# Patient Record
Sex: Female | Born: 1955 | Race: White | Hispanic: No | Marital: Single | Smoking: Never smoker
Health system: Southern US, Community
[De-identification: ages and names within clinical notes are randomized; demographics above are authoritative.]

## PROBLEM LIST (undated history)

## (undated) DIAGNOSIS — N83209 Unspecified ovarian cyst, unspecified side: Secondary | ICD-10-CM

## (undated) DIAGNOSIS — K5792 Diverticulitis of intestine, part unspecified, without perforation or abscess without bleeding: Secondary | ICD-10-CM

## (undated) DIAGNOSIS — R921 Mammographic calcification found on diagnostic imaging of breast: Secondary | ICD-10-CM

## (undated) DIAGNOSIS — Z923 Personal history of irradiation: Secondary | ICD-10-CM

## (undated) DIAGNOSIS — C50919 Malignant neoplasm of unspecified site of unspecified female breast: Secondary | ICD-10-CM

## (undated) HISTORY — DX: Unspecified ovarian cyst, unspecified side: N83.209

## (undated) HISTORY — DX: Mammographic calcification found on diagnostic imaging of breast: R92.1

## (undated) HISTORY — DX: Malignant neoplasm of unspecified site of unspecified female breast: C50.919

---

## 1998-05-17 HISTORY — PX: BREAST LUMPECTOMY: SHX2

## 2005-05-09 ENCOUNTER — Emergency Department (HOSPITAL_COMMUNITY): Admission: EM | Admit: 2005-05-09 | Discharge: 2005-05-09 | Payer: Self-pay | Admitting: Emergency Medicine

## 2006-01-07 ENCOUNTER — Emergency Department (HOSPITAL_COMMUNITY): Admission: EM | Admit: 2006-01-07 | Discharge: 2006-01-07 | Payer: Self-pay | Admitting: Family Medicine

## 2007-03-14 ENCOUNTER — Ambulatory Visit: Payer: Self-pay | Admitting: Obstetrics and Gynecology

## 2007-03-14 ENCOUNTER — Encounter: Payer: Self-pay | Admitting: Obstetrics and Gynecology

## 2007-03-14 ENCOUNTER — Other Ambulatory Visit: Admission: RE | Admit: 2007-03-14 | Discharge: 2007-03-14 | Payer: Self-pay | Admitting: Obstetrics & Gynecology

## 2007-04-19 ENCOUNTER — Encounter: Admission: RE | Admit: 2007-04-19 | Discharge: 2007-04-19 | Payer: Self-pay | Admitting: Internal Medicine

## 2007-06-14 ENCOUNTER — Encounter: Payer: Self-pay | Admitting: Family Medicine

## 2007-12-19 ENCOUNTER — Ambulatory Visit: Payer: Self-pay | Admitting: Family

## 2007-12-22 ENCOUNTER — Encounter: Admission: RE | Admit: 2007-12-22 | Discharge: 2007-12-22 | Payer: Self-pay | Admitting: Obstetrics & Gynecology

## 2007-12-27 ENCOUNTER — Ambulatory Visit: Payer: Self-pay | Admitting: Obstetrics & Gynecology

## 2008-04-03 ENCOUNTER — Emergency Department (HOSPITAL_COMMUNITY): Admission: EM | Admit: 2008-04-03 | Discharge: 2008-04-03 | Payer: Self-pay | Admitting: Emergency Medicine

## 2008-12-19 ENCOUNTER — Ambulatory Visit: Payer: Self-pay | Admitting: Family Medicine

## 2008-12-19 DIAGNOSIS — F4321 Adjustment disorder with depressed mood: Secondary | ICD-10-CM

## 2008-12-19 DIAGNOSIS — N951 Menopausal and female climacteric states: Secondary | ICD-10-CM

## 2008-12-23 LAB — CONVERTED CEMR LAB
ALT: 20 units/L (ref 0–35)
CO2: 23 meq/L (ref 19–32)
Calcium: 9.4 mg/dL (ref 8.4–10.5)
Chloride: 104 meq/L (ref 96–112)
Cholesterol: 252 mg/dL — ABNORMAL HIGH (ref 0–200)
Creatinine, Ser: 0.97 mg/dL (ref 0.40–1.20)
Eosinophils Absolute: 0.5 10*3/uL (ref 0.0–0.7)
FSH: 50.5 milliintl units/mL
HCT: 42.5 % (ref 36.0–46.0)
Hemoglobin: 13.6 g/dL (ref 12.0–15.0)
LH: 33.2 milliintl units/mL
Lymphs Abs: 1.7 10*3/uL (ref 0.7–4.0)
MCV: 90.2 fL (ref 78.0–100.0)
Monocytes Absolute: 0.4 10*3/uL (ref 0.1–1.0)
Monocytes Relative: 8 % (ref 3–12)
Neutrophils Relative %: 51 % (ref 43–77)
RBC: 4.71 M/uL (ref 3.87–5.11)
Total CHOL/HDL Ratio: 3.9
Total Protein: 7.1 g/dL (ref 6.0–8.3)
WBC: 5.3 10*3/uL (ref 4.0–10.5)

## 2009-01-07 ENCOUNTER — Encounter: Payer: Self-pay | Admitting: Family Medicine

## 2009-01-31 ENCOUNTER — Emergency Department (HOSPITAL_COMMUNITY): Admission: EM | Admit: 2009-01-31 | Discharge: 2009-01-31 | Payer: Self-pay | Admitting: Emergency Medicine

## 2009-06-09 ENCOUNTER — Emergency Department (HOSPITAL_COMMUNITY): Admission: EM | Admit: 2009-06-09 | Discharge: 2009-06-09 | Payer: Self-pay | Admitting: Family Medicine

## 2009-06-09 ENCOUNTER — Emergency Department (HOSPITAL_COMMUNITY): Admission: EM | Admit: 2009-06-09 | Discharge: 2009-06-09 | Payer: Self-pay | Admitting: Emergency Medicine

## 2009-06-27 ENCOUNTER — Encounter (INDEPENDENT_AMBULATORY_CARE_PROVIDER_SITE_OTHER): Payer: Self-pay | Admitting: *Deleted

## 2009-06-30 ENCOUNTER — Ambulatory Visit: Payer: Self-pay | Admitting: Gastroenterology

## 2009-07-15 ENCOUNTER — Encounter: Payer: Self-pay | Admitting: Family Medicine

## 2009-07-21 ENCOUNTER — Ambulatory Visit: Payer: Self-pay | Admitting: Gastroenterology

## 2009-07-23 ENCOUNTER — Encounter: Payer: Self-pay | Admitting: Gastroenterology

## 2009-09-24 ENCOUNTER — Ambulatory Visit: Payer: Self-pay | Admitting: Family Medicine

## 2009-09-24 DIAGNOSIS — B360 Pityriasis versicolor: Secondary | ICD-10-CM | POA: Insufficient documentation

## 2009-09-24 DIAGNOSIS — E785 Hyperlipidemia, unspecified: Secondary | ICD-10-CM

## 2009-09-24 DIAGNOSIS — R7309 Other abnormal glucose: Secondary | ICD-10-CM | POA: Insufficient documentation

## 2009-09-24 DIAGNOSIS — K5730 Diverticulosis of large intestine without perforation or abscess without bleeding: Secondary | ICD-10-CM | POA: Insufficient documentation

## 2010-02-08 ENCOUNTER — Emergency Department (HOSPITAL_COMMUNITY)
Admission: EM | Admit: 2010-02-08 | Discharge: 2010-02-08 | Payer: Self-pay | Source: Home / Self Care | Admitting: Family Medicine

## 2010-03-02 ENCOUNTER — Ambulatory Visit: Payer: Self-pay | Admitting: Family Medicine

## 2010-03-02 DIAGNOSIS — N83209 Unspecified ovarian cyst, unspecified side: Secondary | ICD-10-CM

## 2010-03-05 ENCOUNTER — Encounter: Admission: RE | Admit: 2010-03-05 | Discharge: 2010-03-05 | Payer: Self-pay | Admitting: Family Medicine

## 2010-03-06 DIAGNOSIS — R1909 Other intra-abdominal and pelvic swelling, mass and lump: Secondary | ICD-10-CM

## 2010-03-07 ENCOUNTER — Encounter: Admission: RE | Admit: 2010-03-07 | Payer: Self-pay | Admitting: Family Medicine

## 2010-03-09 ENCOUNTER — Telehealth: Payer: Self-pay | Admitting: Family Medicine

## 2010-03-20 ENCOUNTER — Telehealth (INDEPENDENT_AMBULATORY_CARE_PROVIDER_SITE_OTHER): Payer: Self-pay | Admitting: *Deleted

## 2010-03-25 ENCOUNTER — Ambulatory Visit: Payer: Self-pay | Admitting: Genetic Counselor

## 2010-03-30 ENCOUNTER — Encounter: Payer: Self-pay | Admitting: Family Medicine

## 2010-04-02 ENCOUNTER — Ambulatory Visit: Payer: Self-pay | Admitting: Oncology

## 2010-04-03 ENCOUNTER — Encounter: Payer: Self-pay | Admitting: Family Medicine

## 2010-04-14 ENCOUNTER — Ambulatory Visit: Payer: Self-pay | Admitting: Obstetrics & Gynecology

## 2010-05-12 ENCOUNTER — Ambulatory Visit: Payer: Self-pay | Admitting: Oncology

## 2010-05-17 HISTORY — PX: VAGINAL HYSTERECTOMY: SUR661

## 2010-06-06 ENCOUNTER — Encounter: Payer: Self-pay | Admitting: Family Medicine

## 2010-06-12 LAB — CBC
HCT: 40.2 % (ref 36.0–46.0)
Hemoglobin: 13.4 g/dL (ref 12.0–15.0)
MCV: 85.7 fL (ref 78.0–100.0)
Platelets: 280 10*3/uL (ref 150–400)
RBC: 4.69 MIL/uL (ref 3.87–5.11)
WBC: 5.5 10*3/uL (ref 4.0–10.5)

## 2010-06-16 NOTE — Letter (Signed)
Summary: [facility] Instructions  [facility]  [address] [place], [place] [address]   Phone: [phone]  Fax: [phone]       CAYMAN BROGDEN    01-Oct-1955    MRN: [id number]        Procedure Day Dorna Bloom:  Duanne Limerick 07/21/09     Arrival Time:  8:30AM     Procedure Time:  9:30AM     Location of Procedure:                    Juliann Pares _  [facility] (4th Floor)    PREPARATION FOR COLONOSCOPY WITH MOVIPREP   Starting 5 days prior to your procedure 07/16/09 do not eat nuts, seeds, popcorn, corn, beans, peas,  salads, or any raw vegetables.  Do not take any fiber supplements (e.g. Metamucil, Citrucel, and Benefiber).  THE DAY BEFORE YOUR PROCEDURE         DATE: 07/20/09  DAY: SUNDAY  1.  Drink clear liquids the entire day-NO SOLID FOOD  2.  Do not drink anything colored red or purple.  Avoid juices with pulp.  No orange juice.  3.  Drink at least 64 oz. (8 glasses) of fluid/clear liquids during the day to prevent dehydration and help the prep work efficiently.  CLEAR LIQUIDS INCLUDE: Water Jello Ice Popsicles Tea (sugar ok, no milk/cream) Powdered fruit flavored drinks Coffee (sugar ok, no milk/cream) Gatorade Juice: apple, white grape, white cranberry  Lemonade Clear bullion, consomm, broth Carbonated beverages (any kind) Strained chicken noodle soup Hard Candy                             4.  In the morning, mix first dose of MoviPrep solution:    Empty 1 Pouch A and 1 Pouch B into the disposable container    Add lukewarm drinking water to the top line of the container. Mix to dissolve    Refrigerate (mixed solution should be used within 24 hrs)  5.  Begin drinking the prep at 5:00 p.m. The MoviPrep container is divided by 4 marks.   Every 15 minutes drink the solution down to the next mark (approximately 8 oz) until the full liter is complete.   6.  Follow completed prep with 16 oz of clear liquid of your choice (Nothing red or purple).   Continue to drink clear liquids until bedtime.  7.  Before going to bed, mix second dose of MoviPrep solution:    Empty 1 Pouch A and 1 Pouch B into the disposable container    Add lukewarm drinking water to the top line of the container. Mix to dissolve    Refrigerate  THE DAY OF YOUR PROCEDURE      DATE: 07/21/09  DAY: MONDAY  Beginning at 4:30a.m. (5 hours before procedure):         1. Every 15 minutes, drink the solution down to the next mark (approx 8 oz) until the full liter is complete.  2. Follow completed prep with 16 oz. of clear liquid of your choice.    3. You may drink clear liquids until 7:30AM (2 HOURS BEFORE PROCEDURE).   MEDICATION INSTRUCTIONS  Unless otherwise instructed, you should take regular prescription medications with a small sip of water   as early as possible the morning of your procedure.         OTHER INSTRUCTIONS  You will need a responsible adult at  least 57 years of age to accompany you and drive you home.   This person must remain in the waiting room during your procedure.  Wear loose fitting clothing that is easily removed.  Leave jewelry and other valuables at home.  However, you may wish to bring a book to read or  an iPod/MP3 player to listen to music as you wait for your procedure to start.  Remove all body piercing jewelry and leave at home.  Total time from sign-in until discharge is approximately 2-3 hours.  You should go home directly after your procedure and rest.  You can resume normal activities the  day after your procedure.  The day of your procedure you should not:   Drive   Make legal decisions   Operate machinery   Drink alcohol   Return to work  You will receive specific instructions about eating, activities and medications before you leave.    The above instructions have been reviewed and explained to me by   Clide Cliff, RN__________________    I fully understand and can verbalize these  instructions _____________________________ Date _________

## 2010-06-16 NOTE — Letter (Signed)
Summary: Patient Notice- Polyp Results   Gastroenterology  [address] [place], [place] [address]   Phone: [phone]  Fax: [phone]        July 23, 2009 MRN: [id number]    [name] [address] [place], [place]  [address]    Dear Ms. Ranney,  I am pleased to inform you that the colon polyp(s) removed during your recent colonoscopy was (were) found to be benign (no cancer detected) upon pathologic examination.  I recommend you have a repeat colonoscopy examination in 5_ years to look for recurrent polyps, as having colon polyps increases your risk for having recurrent polyps or even colon cancer in the future.  Should you develop new or worsening symptoms of abdominal pain, bowel habit changes or bleeding from the rectum or bowels, please schedule an evaluation with either your primary care physician or with me.  Additional information/recommendations:  __ No further action with gastroenterology is needed at this time. Please      follow-up with your primary care physician for your other healthcare      needs.  __ Please call [phone] to schedule a return visit to review your      situation.  __ Please keep your follow-up visit as already scheduled.  _x_ Continue treatment plan as outlined the day of your exam.  Please call us if you are having persistent problems or have questions about your condition that have not been fully answered at this time.  Sincerely,  Louis Meckel MD  This letter has been electronically signed by your physician.  Appended Document: Patient Notice- Polyp Results Letter mailed 3.11.11

## 2010-06-16 NOTE — Procedures (Signed)
Summary: Colonoscopy  Patient: [name] Note: All result statuses are Final unless otherwise noted.  Tests: (1) Colonoscopy (COL)   COL Colonoscopy           DONE     [facility]     [address]     [place], [place]  [address]           COLONOSCOPY PROCEDURE REPORT           PATIENT:  [name], [name]  MR#:  [id number]     BIRTHDATE:  03/22/1956, 53 yrs. old  GENDER:  female           ENDOSCOPIST:  Barbette Hair. Arlyce Dice, MD     Referred by:           PROCEDURE DATE:  07/21/2009     PROCEDURE:  Colonoscopy with snare polypectomy     ASA CLASS:  Class II     INDICATIONS:  Routine Risk Screening h/o diverticulitis           MEDICATIONS:   Fentanyl 100 mcg IV, Versed 10 mg IV           DESCRIPTION OF PROCEDURE:   After the risks benefits and     alternatives of the procedure were thoroughly explained, informed     consent was obtained.  Digital rectal exam was performed and     revealed no abnormalities.   The LB [id number] endoscope     was introduced through the anus and advanced to the cecum, which     was identified by both the appendix and ileocecal valve, without     limitations.  The quality of the prep was good, using MoviPrep.     The instrument was then slowly withdrawn as the colon was fully     examined.     <<PROCEDUREIMAGES>>           FINDINGS:  A sessile polyp was found in the ascending colon. It     was 3 mm in size. Polyp was snared without cautery. Retrieval was     successful (see image3). snare polyp  Scattered diverticula were     found in the ascending colon.  Mild diverticulosis was found in     the sigmoid colon (see image11).  This was otherwise a normal     examination of the colon (see image1, image2, image5, image6,     image7, image9, image10, image13, and image14).   Retroflexed     views in the rectum revealed no abnormalities.    The scope was     then withdrawn from the patient and the procedure completed.        COMPLICATIONS:  None           ENDOSCOPIC IMPRESSION:     1) 3 mm sessile polyp in the ascending colon     2) Diverticula, scattered in the ascending colon     3) Mild diverticulosis in the sigmoid colon     4) Otherwise normal examination     RECOMMENDATIONS:     1) If the polyp(s) removed today are proven to be adenomatous     (pre-cancerous) polyps, you will need a repeat colonoscopy in 5     years. Otherwise you should continue to follow colorectal cancer     screening guidelines for "routine risk" patients with colonoscopy     in 10 years.           REPEAT EXAM:  You will receive a letter from Dr. Arlyce Dice in 1-2     weeks, after reviewing the final pathology, with followup     recommendations.           ______________________________     Barbette Hair Arlyce Dice, MD           CC:  Seymour Bars, DO           n.     eSIGNED:   Barbette Hair. Kaplan at 07/21/2009 10:33 AM           Almira Bar, [id number]  Note: An exclamation mark (!) indicates a result that was not dispersed into the flowsheet. Document Creation Date: 07/21/2009 10:33 AM _______________________________________________________________________  (1) Order result status: Final Collection or observation date-time: 07/21/2009 10:14 Requested date-time:  Receipt date-time:  Reported date-time:  Referring Physician:   Ordering Physician: Melvia Heaps [phone]) Specimen Source:  Source: Launa Grill Order Number: [id number] Lab site:   Appended Document: Colonoscopy recall     Procedures Next Due Date:    Colonoscopy: 07/2014

## 2010-06-16 NOTE — Letter (Signed)
Summary: [facility]  [facility]   Imported By: Lanelle Bal 04/21/2010 12:37:55  _____________________________________________________________________  External Attachment:    Type:   Image     Comment:   External Document

## 2010-06-16 NOTE — Assessment & Plan Note (Signed)
Summary: tinea versicolor/ labs   Vital Signs:  Patient profile:   55 year old female Height:      66 inches Weight:      163 pounds BMI:     26.40 O2 Sat:      99 % on Room air Pulse rate:   79 / minute BP sitting:   135 / 79  (left arm) Cuff size:   regular  Vitals Entered By: Payton Spark CMA (Sep 24, 2009 3:17 PM)  O2 Flow:  Room air CC: Discuss recent dx of diverticulitis. Also c/o tinea versicolor on arms.   Primary Care Provider:  Seymour Bars DO  CC:  Discuss recent dx of diverticulitis. Also c/o tinea versicolor on arms..  History of Present Illness: 55 yo WF presents for f/u visit.  She had her colonoscopy with Dr Arlyce Dice and had a small 3 mm polyp and diverticulosis.  She recalls having a bout of diverticulitis, treated at the ED in Jan of this year, had a CT to confirm.  She has had tinea versicolor in the past, diagnosed by derm recently.  She is not using anything on it.   Has not been having abd pain or rectal bleeding.  Mood has improved.  No longer needing lorazepam.  She is due to recheck fasting sguar, 103 lst check and LDL , 160 last check.      Current Medications (verified): 1)  None  Allergies (verified): 1)  ! Pcn  Past History:  Past Medical History: high cholesterol IFG diverticulosis  Past Surgical History: Reviewed history from 12/19/2008 and no changes required. 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 Living w/ husband. Son and daughter -- son lives with her. Works [employer] at [employer]. No smoking. No ETOH, some exercise.  Review of Systems      See HPI  Physical Exam  General:  alert, well-developed, well-nourished, well-hydrated, and overweight-appearing.   Mouth:  pharynx pink and moist.   Neck:  no masses.   Lungs:  Normal respiratory effort, chest expands symmetrically. Lungs are clear to auscultation, no crackles or wheezes. Heart:  Normal  rate and regular rhythm. S1 and S2 normal without gallop, murmur, click, rub or other extra sounds. Abdomen:  soft, non-tender, no distention, and no guarding.   Skin:  dry scaley hyperpigmented chronic rash on trunk/ back, UEs.   Psych:  good eye contact, not anxious appearing, and not depressed appearing.     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  This can take up to 6 wks to resolve.  Her updated medication list for this problem includes:    Lamisil At 1 % Soln (Terbinafine hcl) .Marland Kitchen... Apply to rash two times a day x 2 wks  Problem # 2:  HYPERLIPIDEMIA (ICD-272.4) Repeat LDL.  LDL 160 last check and <160 is her goal.  She plans to work on healthy diet, exercise and wt loss. Orders: T-LDL Direct ([id number])  Problem # 3:  OTHER ABNORMAL GLUCOSE (ICD-790.29) Repeat fasitng sugar, 103 last check. Orders: T-Glucose, Blood ([id number])  Problem # 4:  DIVERTICULOSIS OF COLON (ICD-562.10) Confirmed on colonscopy with Dr Arlyce Dice in March. Due for repeat scope in 5 yrs due to polyp. Educated on high fiber diet.  had a bout of diverticulitis in  Jan.  Complete Medication List: 1)  Lamisil At 1 % Soln (Terbinafine hcl) .... Apply to rash two times a day x 2 wks  Patient Instructions: 1)  Use antifungal shampoo in the shower as a body wash (head and shoulders or selsum blue).  Use topical Lamisil x 2 wks. 2)  It can take 6 wks to resolve. 3)  Have fasting lab drawn. 4)  Will call you w/ results. Prescriptions: LAMISIL AT 1 % SOLN (TERBINAFINE HCL) apply to rash two times a day x 2 wks  #1 bottle x 1   Entered and Authorized by:   Seymour Bars DO   Signed by:   Seymour Bars DO on 09/24/2009   Method used:   Electronically to        [facility] # [phone]* (retail)       [address] [place], [place]   [address]       Ph: [phone]       Fax: [phone]   RxID:   [id number]

## 2010-06-16 NOTE — Progress Notes (Signed)
Summary: MRI  Phone Note From Other Clinic   Caller: sandra at [facility] Call For: Bowen Summary of Call: FYI:  pt refused contrast for her MRI.   Pt calls herself and would like to speak with you personally in regards to this MRI Initial call taken by: Avon Gully CMA, Duncan Dull),  March 09, 2010 1:09 PM  Follow-up for Phone Call        I am completely booked today.  Pls ask her what questions she has. Follow-up by: Seymour Bars DO,  March 09, 2010 2:27 PM  Additional Follow-up for Phone Call Additional follow up Details #1::        Pt states would like to have a hysterectomy and just have it all removed. Does not want to do test. York Spaniel she looked in E-chart at her report Additional Follow-up by: Kathlene November LPN,  March 09, 2010 2:34 PM    Additional Follow-up for Phone Call Additional follow up Details #2::    She needs to see a gynecologist.   Follow-up by: Seymour Bars DO,  March 09, 2010 4:50 PM   Appended Document: MRI Pt notified and given name and # for [facility] for [facility] in the building. KJ LPN

## 2010-06-16 NOTE — Assessment & Plan Note (Signed)
Summary: BRCA testing   Vital Signs:  Patient profile:   55 year old female Height:      66 inches Weight:      163 pounds BMI:     26.40 O2 Sat:      96 % on Room air Temp:     98.5 degrees F oral Pulse rate:   83 / minute BP sitting:   130 / 81  (left arm) Cuff size:   regular  Vitals Entered By: Payton Spark CMA (March 02, 2010 3:21 PM)  O2 Flow:  Room air CC: Requesting [place]   Primary Care Provider:  Seymour Bars DO  CC:  Requesting [place].  History of Present Illness: 55 yo WF presents for  a hx of a cyst on her ovary from 2-3 yrs ago associated with pain on and off.  She is requesting a repeat u/s.  Her mom had ovarian cancer and died at 61.  No other fam hx of ovarian or breast cancer.  Her mom was diagnosed fairly late.  She has not had genetic testing.  She denies bloating, pelvic pain, vag bleeding or discharge.    She did go to the hosptlal last month for diverticulitis and completely cleared.   Current Medications (verified): 1)  None  Allergies (verified): 1)  ! Pcn  Past History:  Past Medical History: high cholesterol IFG diverticulosis  hx of ovarian cyst menopause at 53.  Past Surgical History: Reviewed history from 12/19/2008 and no changes required. 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 Mom died of ovarian cancer at 44.   GERD.   Father died of emphysema  Brother died from bone cancer; younger Younger brother Younger sister healthy  Social History: Reviewed history from 12/19/2008 and no changes required. Living w/ husband. Son and daughter -- son lives with her. Works [employer] at [employer]. No smoking. No ETOH, some exercise.  Review of Systems      See HPI  Physical Exam  General:  alert, well-developed, well-nourished, well-hydrated, and overweight-appearing.   Skin:  color normal.   Psych:  good eye contact, not anxious appearing, and not  depressed appearing.     Impression & Recommendations:  Problem # 1:  OVARIAN CYST (ICD-620.2) Hx of ovarian cyst, now post menopausal x 1 yr with fam hx (mom) with ovarian cancer and personal hx of breast cancer carcinoma in situ. Will get BRCA 1 & 2 testing today.  Ovarian U/s ordered.  May add a CA 125.   Orders: T-*Unlisted Diagnostic X-ray test/procedure [id number])  Patient Instructions: 1)  Will get you set up for ovarian ultrasound downstairs. 2)  Will do your genetic testing today.   Orders Added: 1)  T-*Unlisted Diagnostic X-ray test/procedure [98119] 2)  Est. Patient Level III [14782]

## 2010-06-16 NOTE — Miscellaneous (Signed)
Summary: dir col .Marland Kitchen..em  Clinical Lists Changes  Medications: Added new medication of MOVIPREP 100 GM  SOLR (PEG-KCL-NACL-NASULF-NA ASC-C) As directed. - Signed Rx of MOVIPREP 100 GM  SOLR (PEG-KCL-NACL-NASULF-NA ASC-C) As directed.;  #1 x 0;  Signed;  Entered by: Clide Cliff RN;  Authorized by: Louis Meckel MD;  Method used: Electronically to [facility] # [phone]*, [address] [place], [place], [place]  [address], Ph: [phone], Fax: [phone] Allergies: Changed allergy or adverse reaction from PCN to PCN    Prescriptions: MOVIPREP 100 GM  SOLR (PEG-KCL-NACL-NASULF-NA ASC-C) As directed.  #1 x 0   Entered by:   Clide Cliff RN   Authorized by:   Louis Meckel MD   Signed by:   Clide Cliff RN on 06/30/2009   Method used:   Electronically to        [facility] # [phone]* (retail)       [address] [place], [place]  [address]       Ph: [phone]       Fax: [phone]   RxID:   [id number]

## 2010-06-16 NOTE — Miscellaneous (Signed)
Summary: mammogram normal  Clinical Lists Changes  Observations: Added new observation of MAMMO DUE: 07/2010 (07/15/2009 12:57) Added new observation of MAMMRECACT: Screening mammogram in 1 year.    (07/14/2009 12:57) Added new observation of MAMMOGRAM: HPR  No specific mammographic evidence of malignancy.  No significant changes compared to previous study.  Assessment: BIRADS 1.  (07/14/2009 12:57)      Mammogram  Procedure date:  07/14/2009  Findings:      HPR  No specific mammographic evidence of malignancy.  No significant changes compared to previous study.  Assessment: BIRADS 1.   Comments:      Screening mammogram in 1 year.     Procedures Next Due Date:    Mammogram: 07/2010   Mammogram  Procedure date:  07/14/2009  Findings:      HPR  No specific mammographic evidence of malignancy.  No significant changes compared to previous study.  Assessment: BIRADS 1.   Comments:      Screening mammogram in 1 year.     Procedures Next Due Date:    Mammogram: 07/2010

## 2010-06-16 NOTE — Progress Notes (Signed)
Summary: genetic testing results  Phone Note Outgoing Call   Call placed by: Seymour Bars DO,  March 20, 2010 12:58 PM Summary of Call: I called and LMOM asking pt to call back for BRCA test results. I am going to proceed with a referral to a genetic counselor (BRCA -2 is + for gene mutation). Initial call taken by: Seymour Bars DO,  March 20, 2010 12:58 PM  Follow-up for Phone Call        can you try to get ahold of her today and see if she has gotten a copy of her results. Follow-up by: Seymour Bars DO,  March 23, 2010 10:31 AM  Additional Follow-up for Phone Call Additional follow up Details #1::        Pt aware of the above and will wait to hear from genetic counseling.  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

## 2010-06-18 ENCOUNTER — Ambulatory Visit (HOSPITAL_COMMUNITY)
Admission: RE | Admit: 2010-06-18 | Discharge: 2010-06-19 | Disposition: A | Discharge status: Home / Self Care | Payer: 59 | Source: Ambulatory Visit | Attending: Obstetrics & Gynecology | Admitting: Obstetrics & Gynecology

## 2010-06-18 ENCOUNTER — Other Ambulatory Visit: Payer: Self-pay | Admitting: Obstetrics & Gynecology

## 2010-06-18 DIAGNOSIS — D250 Submucous leiomyoma of uterus: Secondary | ICD-10-CM

## 2010-06-18 DIAGNOSIS — N83209 Unspecified ovarian cyst, unspecified side: Secondary | ICD-10-CM | POA: Insufficient documentation

## 2010-06-18 DIAGNOSIS — Z1501 Genetic susceptibility to malignant neoplasm of breast: Secondary | ICD-10-CM

## 2010-06-18 DIAGNOSIS — Z8041 Family history of malignant neoplasm of ovary: Secondary | ICD-10-CM

## 2010-06-18 NOTE — Consult Note (Signed)
Summary: [facility]  [facility]   Imported By: Lanelle Bal 05/04/2010 10:07:57  _____________________________________________________________________  External Attachment:    Type:   Image     Comment:   External Document

## 2010-06-19 LAB — CBC
Platelets: 270 10*3/uL (ref 150–400)
RBC: 4.36 MIL/uL (ref 3.87–5.11)
WBC: 7.8 10*3/uL (ref 4.0–10.5)

## 2010-07-02 NOTE — Op Note (Signed)
[name], [name]               ACCOUNT NO.:  [id number]  MEDICAL RECORD NO.:  [id number]           PATIENT TYPE:  I  LOCATION:  9304                          FACILITY:  [facility]  PHYSICIAN:  [name], MD        DATE OF BIRTH:  03-May-1956  DATE OF PROCEDURE:  06/18/2010 DATE OF DISCHARGE:                              OPERATIVE REPORT   PREOPERATIVE DIAGNOSES:  Fibroids, positive BRCA 1and 2, and left ovarian cyst.  POSTOPERATIVE DIAGNOSES:  Fibroids, positive BRCA 1and 2, and left ovarian cyst.  PROCEDURE:  Laparoscopic-assisted vaginal hysterectomy, bilateral salpingo-oophorectomy.  SURGEON:  [name], MD  ASSISTANT:  Horton Chin, MD  COMPLICATIONS:  None.  ANESTHESIA:  GETA, Dana Allan, MD  COMPLICATIONS:  None.  ESTIMATED BLOOD LOSS:  100 mL.  SPECIMENS:  Uterus, tubes, and ovaries.  FINDINGS:  Complex cyst of the left ovary/oviduct.  Normal-appearing right adnexa and oviduct bilaterally, fibroid of her uterus.DETAILS OF PROCEDURE AND FINDINGS:  The risks, benefits, and alternatives of surgery were explained, understood, and accepted.  The patient is a Jehovah Witness, so she said that she would not be willing to have blood unless her death was eminent in which case she would be willing to accept the transfusion.  Consents were signed.  IV Ancef was given.  She was taken to the operating room.  SCDs were placed and functioning throughout the case.  General anesthesia was applied without complication.  She was then placed in dorsal lithotomy position.  Her vagina and abdomen were prepped and draped in usual sterile fashion. Bladder was emptied for a large amount of clear urine with a Robinson catheter.  Left vertical umbilical incision was made.  A Veress needle was placed intraperitoneally.  Low-flow CO2 was used to insufflate the abdomen to approximately 3 liters.  The patient's abdominal pressure was always less than 5. When the pneumoperitoneum was  established, a 10-mm trocar was placed.  Laparoscopy confirmed correct placement.  She was placed in Trendelenburg position.  Her pelvis was inspected.  Please note that prior to moving to the abdominal portion of the case, I had placed a Hulka manipulator on the cervix for uterine manipulation.  A 5- mm port was placed in each lower quadrant under direct laparoscopic visualization, taking care to avoid the inferior epigastric vessels. Inspection of the pelvis revealed the above-noted findings.  A gyrus instrument was used to cauterize and cut the infundibular pelvic ligaments on each side.  Please note the ureters positions were noted bilaterally.  The round ligaments were cauterized and ligated again using the gyrus.  At this point, the pneumoperitoneum was allowed to escape and attention was turned to the vagina.  A solution was created involving a 100 mL of injectable saline and 30 mL of 0.5% Marcaine.  A 120 mL of this solution was injected in circumferential fashion at the cervicovaginal junction for hydrodissection purposes.  A circumferential incision was made here with a #10 blade.  The posterior peritoneum was entered, tagged, and held.  The anterior peritoneum was identified and tagged and held.  Please note a  Foley catheter had been placed and clear urine drained to the case.  The uterosacral ligaments were clamped, cut, and ligated.  These were tagged and held for further use.  The remainder of the cervical and uterine attachments of the pelvis were clamped, cut and ligated, maintaining excellent hemostasis.  The uterus was then removed and noted to be intact along with both adnexa.  Sponge on a stick was used to keep the bowel out of the operative site.  The tagged uterosacral ligaments were used to create angle sutures at each side of the vaginal cuff.  The vaginal cuff was then closed with a running locking 0 Vicryl suture.  Excellent hemostasis was noted.  Gloves  were changed and a pneumoperitoneum was reestablished.  The pedicles were all noted to be hemostatic and the 5-mm ports were removed.  No bleeding was noted from their sites.  The CO2 was allowed to escape from the abdomen and a 10-mm port was removed.  The fascia was elevated and closed with a figure-of-eight suture of 0 Vicryl, total of 10 mL of 0.5% Marcaine was injected in the subcutaneous tissue at the 3 incision sites. Subcuticular closure was done at all sites with 4-0 Vicryl suture. Excellent cosmetic results were obtained.  The patient was extubated and taken to recovery room in stable condition.  Instrument, sponge, and needle counts were correct.     [name], MD     MCD/MEDQ  D:  06/18/2010  T:  06/19/2010  Job:  [id number]  Electronically Signed by Nicholaus Bloom MD on 07/02/2010 01:36:42 PM

## 2010-07-02 NOTE — Discharge Summary (Signed)
  [name], Foster               ACCOUNT NO.:  [id number]  MEDICAL RECORD NO.:  [id number]           PATIENT TYPE:  I  LOCATION:  9304                          [facility]  [facility]  PHYSICIAN:  [name], MD        DATE OF BIRTH:  [birth year]  DATE OF ADMISSION:  06/18/2010 DATE OF DISCHARGE:  06/19/2010                              DISCHARGE SUMMARY   ADMISSION DIAGNOSES: 1. Positive breast cancer antigen 1 and 2. 2. Fibroids. 3. Left ovarian cyst.  DISCHARGE DIAGNOSES: 1. Positive breast cancer antigen 1 and 2. 2. Fibroids. 3. Left ovarian cyst.  CONDITION:  Stable.  DISPOSITION:  Home.  DIET:  As tolerated.  Follow up in 6 weeks at the office p.r.n. sooner.  BRIEF HISTORY OF PRESENT ILLNESS:  [name] is a 55 year old postmenopausal lady who has a family history of ovarian cancer in her mother.  She had BRCA 1 and 2 tested and she was positive.  She had known fibroids and has had a persistent left ovarian cyst.  Please note a CA-125 was within normal limits, but she would like to have a hysterectomy and removal of both her ovaries and tubes.  PAST MEDICAL HISTORY:  As above and history of diverticulitis.  HOSPITAL COURSE:  She came in on June 18, 2010, and underwent an uncomplicated laparoscopic-assisted vaginal hysterectomy, bilateral salpingo-oophorectomy.  Her EBL was 100 mL.  Her postop hemoglobin was 12.4, preoperatively it had been 13.4.  By postoperative day #1, she was ambulating, tolerating p.o. without nausea or vomiting.  She was voiding and passing gas.  She was ready to go home.  Physical exam was within normal limits. Her abdomen was soft and nontender with normoactive bowel sounds.  Her incisions were clean, dry, and intact.  She will follow up as above. Her prescriptions on discharge are ibuprofen 800 mg p.o. q.8 hours p.r.n. and Percocet 325/5 mg 1 p.o. q.4 hours p.r.n. pain, #30, no refills.     [name], MD     MCD/MEDQ  D:   06/19/2010  T:  06/20/2010  Job:  [id number]  Electronically Signed by Nicholaus Bloom MD on 07/02/2010 01:33:28 PM

## 2010-07-08 ENCOUNTER — Encounter: Payer: 59 | Admitting: Obstetrics & Gynecology

## 2010-07-08 DIAGNOSIS — Z09 Encounter for follow-up examination after completed treatment for conditions other than malignant neoplasm: Secondary | ICD-10-CM

## 2010-07-20 ENCOUNTER — Encounter: Payer: Self-pay | Admitting: Family Medicine

## 2010-07-28 NOTE — Miscellaneous (Signed)
Summary: mammogram  Clinical Lists Changes  Observations: Added new observation of MAMMO DUE: 07/2011 (07/20/2010 8:00) Added new observation of MAMMRECACT: Screening mammogram in 1 year.    (07/15/2010 8:00) Added new observation of MAMMOGRAM: HPR  No specific mammographic evidence of malignancy.  No significant changes compared to previous study.  Assessment: BIRADS 1.  (07/15/2010 8:00)      Mammogram  Procedure date:  07/15/2010  Findings:      HPR  No specific mammographic evidence of malignancy.  No significant changes compared to previous study.  Assessment: BIRADS 1.   Comments:      Screening mammogram in 1 year.     Procedures Next Due Date:    Mammogram: 07/2011   Mammogram  Procedure date:  07/15/2010  Findings:      HPR  No specific mammographic evidence of malignancy.  No significant changes compared to previous study.  Assessment: BIRADS 1.   Comments:      Screening mammogram in 1 year.     Procedures Next Due Date:    Mammogram: 07/2011

## 2010-08-02 LAB — DIFFERENTIAL
Basophils Relative: 1 % (ref 0–1)
Monocytes Relative: 6 % (ref 3–12)
Neutro Abs: 7.5 10*3/uL (ref 1.7–7.7)
Neutrophils Relative %: 77 % (ref 43–77)

## 2010-08-02 LAB — WET PREP, GENITAL
Trich, Wet Prep: NONE SEEN
Yeast Wet Prep HPF POC: NONE SEEN

## 2010-08-02 LAB — POCT URINALYSIS DIP (DEVICE)
Bilirubin Urine: NEGATIVE
Glucose, UA: NEGATIVE mg/dL
Ketones, ur: NEGATIVE mg/dL
Specific Gravity, Urine: 1.01 (ref 1.005–1.030)

## 2010-08-02 LAB — CBC
MCHC: 34.8 g/dL (ref 30.0–36.0)
Platelets: 247 10*3/uL (ref 150–400)
RBC: 4.7 MIL/uL (ref 3.87–5.11)
WBC: 9.8 10*3/uL (ref 4.0–10.5)

## 2010-08-02 LAB — BASIC METABOLIC PANEL
CO2: 26 mEq/L (ref 19–32)
Calcium: 8.9 mg/dL (ref 8.4–10.5)
Creatinine, Ser: 0.76 mg/dL (ref 0.4–1.2)
GFR calc Af Amer: >60 mL/min (ref >60)

## 2010-08-07 NOTE — Assessment & Plan Note (Signed)
NAMENICKOLA, LENIG NO.:  [id number]  MEDICAL RECORD NO.:  [id number]           PATIENT TYPE:  LOCATION:  [facility] [place]           FACILITY:  PHYSICIAN:  [name], MD        DATE OF BIRTH:  1955/08/16  DATE OF SERVICE:  07/08/2010                                 CLINIC NOTE  IDENTIFICATION:  [name] is a 55 year old married white lady who is now 3 weeks postop status post laparoscopic-assisted vaginal hysterectomy and bilateral salpingo-oophorectomy.  She comes in with no complaints.  She says her bowel and bladder are functioning normally. She has not yet had intercourse and does not plan to in the near future. She is not taking any pain pills.  She has already returned to exercising and moving boxes around at her garage after exercising (cycling class).  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  On exam, her incisions are all well-healed.  Her abdominal exam is benign and I have written her a note so that she can return to work, but I have written that she should not do any heavy lifting at work. She says she does not feel the need for any hormone replacement therapy at this time and I have told her to come back in a year or sooner if necessary.  I have told her that if she needs hormones, she just needs to call and we will talk about them (she does have a BRCA 1 and 2 positive, so I would prefer that she take an SSRI if necessary for menopausal symptoms).     [name], MD    MCD/MEDQ  D:  07/08/2010  T:  07/09/2010  Job:  [id number]

## 2010-09-29 NOTE — Assessment & Plan Note (Signed)
[name], [name]               ACCOUNT NO.:  [id number]   MEDICAL RECORD NO.:  [id number]          PATIENT TYPE:  POB   LOCATION:  [facility] [place]         [facility]ACILITY:  [facility]   PHYSICIAN:  Sid Falcon, CNM  DATE OF BIRTH:  [birth year]   DATE OF SERVICE:                                  CLINIC NOTE   The patient today reports of vaginal discharge with odor that occurs off  and on, having no vaginal itching.  She used a Monistat last night to  help.  The patient's LMP was on December 11, 2007.  Last Pap smear was in  October 2008, was negative for intraepithelial lesions, was seen here  for total well woman exam with a negative exam.  The patient also  requests to have a pelvic ultrasound due to concerns of ovarian cyst  that was reported in with the family history of ovarian cancer and would  like to see what ovaries are doing at this time.  Denies any type of  pelvic pain today.   Upon exam, vaginal area had somewhat adherent discharge.  No abnormal  lesions seen.  No odor noted during exam.  Cervix, no abnormal  discharge.  No abnormal lesions.  Cervix is pink.  Negative cervical  motion tenderness.   Wet prep, positive clue cells.  Negative yeast.  No Trichomonas.   ASSESSMENT:  1. Bacterial vaginosis.  2. Family history of ovarian cancer.   PLAN:  Prescription for metronidazole 500 mg 1 p.o. b.i.d. x7 days, #14.  The patient explained bacterial vaginosis and its course, and its  typical resolution with prescription.  The patient will be scheduled for  pelvic ultrasound to assess uterus and ovaries.  Follow up in 2 weeks  for results of ultrasound.      Sid Falcon, CNM     WM/MEDQ  D:  12/19/2007  T:  12/20/2007  Job:  [id number]

## 2010-09-29 NOTE — Assessment & Plan Note (Signed)
NAMEHAYLO, FAKE NO.:  [id number]   MEDICAL RECORD NO.:  [id number]          PATIENT TYPE:  POB   LOCATION:  [facility] [place]         FACILITY:  [facility]   PHYSICIAN:  [name], MD        DATE OF BIRTH:  September 13, 1955   DATE OF SERVICE:  04/14/2010                                  CLINIC NOTE   [name] is a 54 year old [place] American married white, gravida 3,  para 2, abortus 1.  She comes in here for a annual exam.  She tells me  that 2 weeks ago she had a BRCA 1 and 2 testing through her family  doctor and she came back positive for BRCA 2.  Her mother died of  ovarian cancer at age 82.  She also has a known history of fibroids and  a persistent left ovarian cyst.  She would like to have a hysterectomy  and removal of her ovaries and tubes.   PAST MEDICAL HISTORY:  Borderline elevated cholesterol and borderline  elevated blood pressure both are currently untreated and as above.   REVIEW OF SYSTEMS:  She has been married for 6 years.  She was widowed  from her first marriage.  She does have intercourse and complains of  some vaginal dryness for which she uses lubrication.  She is able to  achieve orgasm.  She works at [employer] as a Psychologist, sport and exercise.  She has been  menopausal for more than a year and I confirmed that was an [facility] done in  2009.  Her last Pap smear was normal in 2008 and a mammogram was normal  February 2011.   PAST SURGICAL HISTORY:  Left breast biopsy in 2000.   MEDICATIONS:  She takes multiple vitamins.   FAMILY HISTORY:  Ovarian cancer in her mother, but no breast or colon  cancer.   DRUG ALLERGIES:  PENICILLINS.  She has no latex or food allergies.   PHYSICAL EXAMINATION:  GENERAL:  Well-nourished, well-hydrated very  pleasant white female, height 5 feet 6 inches, weight 164 pounds, blood  pressure 130/78, pulse 87.  HEENT:  Normal.  BREASTS:  Normal bilaterally.  No nipple discharge, skin changes or  masses.  ABDOMEN:  No  palpable hepatosplenomegaly.  EXTERNAL GENITALIA:  Marked atrophy.  No lesions.  Vagina normal mucosa  with the exception of atrophy.  Cervix parous, no lesions.  Uterus is  normal size and shape, anteverted, mobile, nontender.  Adnexa nontender.  She does have a small left ovarian cyst.  Please note, that her pelvis  is proven for greater than 9 pound baby.  I feel like her pubic arch  would allow a vaginal hysterectomy.   ASSESSMENT AND PLAN:  1. Annual exam.  I have checked Pap smear and recommended self-breast      and self-vulvar exams.  2. Left ovarian cyst.  Recent ultrasound confirms that there is no      particular change from a 2009 ultrasound.  I am, however, ordering      a CA-125 today.  3. Positive breast cancer antigen 2 and the patient desire for      hysterectomy and  removal of recent tubes.  I agree with this plan.      I feel like her best route for hysterectomy would be laparoscopic-      assisted vaginal hysterectomy, since I will be removing the adnexa      as well.  She understands the risks of surgery including, but not      limited to infection, bleeding, damage to bowel, bladder and      ureters.  I will return the paperwork today and we will schedule it      for sometime in January since her work schedule has already been      out of December.      [name], MD     MCD/MEDQ  D:  04/14/2010  T:  04/15/2010  Job:  [id number]

## 2010-10-02 NOTE — Assessment & Plan Note (Signed)
NAMEJERA, HEADINGS               ACCOUNT NO.:  [id number]   MEDICAL RECORD NO.:  [id number]          PATIENT TYPE:  POB   LOCATION:  [facility]         FACILITY:  [facility]   PHYSICIAN:  Caren Griffins, CNM       DATE OF BIRTH:  11-20-1955   DATE OF SERVICE:                                  CLINIC NOTE   REASON FOR VISIT:  Annual GYN.   HISTORY:  This is a 55 year old Caucasian female, G2, P2-0-0-2 who would  like an annual exam and Pap smear.  Her last Pap was normal in August  2007, and she has always had normal Pap's.  She is aware that she has a  uterine fibroid that was 3 cm diagnosed on pelvic ultrasound in August  2007.  This was done due to her mother having had ovarian cancer at age  65.  At that visit, the patient also had a CA-125 done that was normal.  She states that her mother had menopause at age 31.  She has had some  menstrual irregularity.  Usually her cycles are about 30-day interval,  and she has had some that were 1 to 2 weeks late, and at present she is  now 10 days late.  She does not have significant dysmenorrhea.  She has  medium flow, and no intermenstrual spotting.  She has occasionally had  some hot flashes, but this does not interfere with her daily living.   ALLERGIES:  NONE.   CURRENT MEDICATIONS:  None.   IMMUNIZATIONS:  Usual childhood.   MENSTRUAL HISTORY:  Is 15x30x4.  As above.   CONTRACEPTIVE HISTORY:  She has been with 1 partner for the past 12  years.  They have been mutually monogamous, and only sexually active for  the past 3 years.  She is not using contraception.   GYN HISTORY:  Pap's normal.  Negative SDIs.  She has had a normal  mammogram over a year ago.  Never had colonoscopy.   SURGERIES:  Wisdom teeth extracted 2007.   FAMILY HISTORY:  Mother deceased of ovarian cancer.   PERSONAL MEDICAL HISTORY:  Completely negative.  She has seen a primary  medical doctor in [facility] 1 to 2 years ago.  She does state that she  has  white coat hypertension, and that she does have high cholesterol  diagnosed 2 years ago.   SOCIAL HISTORY:  Lives with husband.  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  Does not drink or drink caffeinated beverages.  No history  of abuse or substance abuse.   REVIEW OF SYSTEMS:  Complete review of systems is essentially negative,  except for occasional hot flashes as above, and she sometimes has some  numbness and tingling in the index finger and thumb of her left hand,  which she relates to a neck problem.   PHYSICAL EXAMINATION:  BP 140/86.  Her BMI is 26.  WNWD:  No apparent distress.  NECK:  Thyroid smooth, not enlarged.  CORONARY:  Regular rate and rhythm without murmur.  LUNGS:  Clear to auscultation bilaterally.  ABDOMEN:  Soft.  Flat.  No organomegaly or masses appreciated.  PELVIC EXAM:  Normal external female genitalia.  BSU negative.  Vagina  is somewhat pale with decreased rugae.  Cervix nulliparous.  No lesions.  Pap, GC, chlamydia sent.  Bimanual:  Uterus ULNS upper limits normal  size.  Nontender.  No masses noted.  Adnexa without tenderness or  masses.   ASSESSMENT:  Normal GYN exam, except mild vaginal atrophic changes.  History of small uterine fibroid.  Early perimenopausal bleeding  pattern.  Family history of ovarian cancer.   PLAN:  Patient desires all SDI testing, therefore, HIV, RPR, HBSAG, GC,  chlamydia are sent.  CBC done.  Healthcare maintenance counseling  regarding seatbelt use, increase calcium in diet, and calcium  supplementation, exercise, need to followup her blood pressure, which  she can easily do at her place of employment.  Advised necessity for  colonoscopy after the age of 60, which patient is considering, and she  is scheduled for a mammogram.           ______________________________  Caren Griffins, CNM     DP/MEDQ  D:  03/17/2007  T:  03/17/2007  Job:  [id number]

## 2011-08-18 ENCOUNTER — Ambulatory Visit (INDEPENDENT_AMBULATORY_CARE_PROVIDER_SITE_OTHER): Payer: 59 | Admitting: Obstetrics & Gynecology

## 2011-08-18 ENCOUNTER — Encounter: Payer: Self-pay | Admitting: Obstetrics & Gynecology

## 2011-08-18 VITALS — BP 140/76 | HR 73 | Temp 98.3°F | Resp 16 | Ht 64.0 in | Wt 168.0 lb

## 2011-08-18 DIAGNOSIS — R1909 Other intra-abdominal and pelvic swelling, mass and lump: Secondary | ICD-10-CM

## 2011-08-18 DIAGNOSIS — N951 Menopausal and female climacteric states: Secondary | ICD-10-CM

## 2011-08-18 DIAGNOSIS — Z0000 Encounter for general adult medical examination without abnormal findings: Secondary | ICD-10-CM

## 2011-08-18 NOTE — Progress Notes (Signed)
  Subjective:    Patient ID: [name], female    DOB: 1956/01/21, 56 y.o.   MRN: [id number]  HPI  Ms. Lacek is a a 56 yo MW lady who is now 1 year status post LAVH/BSO who comes in with menopausal symptoms, decreased libido, vaginal dryness, hot flashes. She would like baseline labs and would then like to have bio identical hormones prescribed for her.  Review of Systems   Mammogram recently normal Objective:   Physical Exam        Assessment & Plan:  Menopausal symptoms- get baseline labs today, HRT in the future.

## 2011-08-19 LAB — CBC
HCT: 43.4 % (ref 36.0–46.0)
Hemoglobin: 13.6 g/dL (ref 12.0–15.0)
MCH: 28.6 pg (ref 26.0–34.0)
MCV: 91.4 fL (ref 78.0–100.0)
Platelets: 259 10*3/uL (ref 150–400)
RBC: 4.75 MIL/uL (ref 3.87–5.11)

## 2011-08-19 LAB — COMPREHENSIVE METABOLIC PANEL
BUN: 18 mg/dL (ref 6–23)
CO2: 27 mEq/L (ref 19–32)
Calcium: 9.8 mg/dL (ref 8.4–10.5)
Chloride: 104 mEq/L (ref 96–112)
Creat: 0.82 mg/dL (ref 0.50–1.10)
Glucose, Bld: 87 mg/dL (ref 70–99)
Total Bilirubin: 0.4 mg/dL (ref 0.3–1.2)

## 2011-08-19 LAB — TESTOSTERONE, FREE, TOTAL, SHBG: Testosterone, Free: 1.9 pg/mL (ref 0.6–6.8)

## 2011-08-19 LAB — TSH: TSH: 2.863 u[IU]/mL (ref 0.350–4.500)

## 2011-08-23 ENCOUNTER — Telehealth: Payer: Self-pay | Admitting: *Deleted

## 2011-08-23 DIAGNOSIS — N951 Menopausal and female climacteric states: Secondary | ICD-10-CM

## 2011-08-23 NOTE — Progress Notes (Signed)
Addended by: Granville Lewis on: 08/23/2011 03:23 PM   Modules accepted: Orders

## 2011-08-23 NOTE — Telephone Encounter (Signed)
Pt had to have blood drawn for a free estradiol only

## 2011-08-24 LAB — ESTRADIOL, FREE

## 2011-09-01 LAB — ESTRADIOL, FREE
Estradiol, Free: 0.22 pg/mL
Estradiol: 13 pg/mL

## 2011-09-08 ENCOUNTER — Telehealth: Payer: Self-pay | Admitting: *Deleted

## 2011-09-08 DIAGNOSIS — N951 Menopausal and female climacteric states: Secondary | ICD-10-CM

## 2011-09-08 MED ORDER — AMBULATORY NON FORMULARY MEDICATION: Status: DC

## 2011-09-08 NOTE — Telephone Encounter (Signed)
Compounding for progesterone/Estrogen sent to [facility]

## 2012-04-19 ENCOUNTER — Encounter (HOSPITAL_COMMUNITY): Payer: Self-pay | Admitting: *Deleted

## 2012-04-19 ENCOUNTER — Emergency Department (HOSPITAL_COMMUNITY)
Admission: EM | Admit: 2012-04-19 | Discharge: 2012-04-19 | Disposition: A | Discharge status: Home / Self Care | Payer: 59 | Source: Home / Self Care | Attending: Emergency Medicine | Admitting: Emergency Medicine

## 2012-04-19 DIAGNOSIS — K5732 Diverticulitis of large intestine without perforation or abscess without bleeding: Secondary | ICD-10-CM

## 2012-04-19 DIAGNOSIS — K5792 Diverticulitis of intestine, part unspecified, without perforation or abscess without bleeding: Secondary | ICD-10-CM

## 2012-04-19 LAB — POCT URINALYSIS DIP (DEVICE)
Bilirubin Urine: NEGATIVE
Glucose, UA: NEGATIVE mg/dL
Nitrite: NEGATIVE
Specific Gravity, Urine: 1.02 (ref 1.005–1.030)
Urobilinogen, UA: 0.2 mg/dL (ref 0.0–1.0)

## 2012-04-19 MED ORDER — CIPROFLOXACIN HCL 500 MG PO TABS: 500.0000 mg | ORAL_TABLET | Freq: Two times a day (BID) | ORAL | Status: DC

## 2012-04-19 MED ORDER — METRONIDAZOLE 500 MG PO TABS: 500.0000 mg | ORAL_TABLET | Freq: Three times a day (TID) | ORAL | Status: DC

## 2012-04-19 NOTE — ED Provider Notes (Signed)
Chief Complaint  Patient presents with  . Abdominal Pain    History of Present Illness:    [name] is a 56 year old female with a three-day history of left lower quadrant abdominal pain which radiates into her left leg and to her back. It's worse with a bowel movement. The pain is rated a 5/10 in intensity. It's been associated with chills but no fever. She has had some urinary frequency. She denies any nausea, vomiting, melena, or hematochezia. She has had no diarrhea. She is status post hysterectomy and oophorectomy. She denies any GYN complaints. She's had no dysuria, hematuria, or malodorous urine. She does have a history of diverticulitis about 5 years ago. She had a CT scan at the time and this resolved with antibiotics.  Review of Systems:  Other than noted above, the patient denies any of the following symptoms: Constitutional:  No fever, chills, fatigue, weight loss or anorexia. Lungs:  No cough or shortness of breath. Heart:  No chest pain, palpitations, syncope or edema.  No cardiac history. Abdomen:  No nausea, vomiting, hematememesis, melena, diarrhea, or hematochezia. GU:  No dysuria, frequency, urgency, or hematuria. Gyn:  No vaginal discharge, itching, abnormal bleeding, dyspareunia, or pelvic pain.  PMFSH:  Past medical history, family history, social history, meds, and allergies were reviewed along with nurse's notes.  No prior abdominal surgeries, past history of GI problems, STDs or GYN problems.  No history of aspirin or NSAID use.  No excessive alcohol intake.  Physical Exam:   Vital signs:  BP 153/92  Pulse 83  Temp 98.6 F (37 C) (Oral)  Resp 18  SpO2 96% Gen:  Alert, oriented, in no distress. Lungs:  Breath sounds clear and equal bilaterally.  No wheezes, rales or rhonchi. Heart:  Regular rhythm.  No gallops or murmers.   Abdomen:  Abdomen was soft, flat, nondistended. There is mild tenderness to palpation in the left lower quadrant without guarding or rebound. No  organomegaly or mass. Bowel sounds are normally active. Skin:  Clear, warm and dry.  No rash.  Labs:   Results for orders placed during the hospital encounter of 04/19/12  POCT URINALYSIS DIP (DEVICE)      Component Value Range   Glucose, UA NEGATIVE  NEGATIVE mg/dL   Bilirubin Urine NEGATIVE  NEGATIVE   Ketones, ur NEGATIVE  NEGATIVE mg/dL   Specific Gravity, Urine 1.020  1.005 - 1.030   Hgb urine dipstick MODERATE (*) NEGATIVE   pH 5.5  5.0 - 8.0   Protein, ur NEGATIVE  NEGATIVE mg/dL   Urobilinogen, UA 0.2  0.0 - 1.0 mg/dL   Nitrite NEGATIVE  NEGATIVE   Leukocytes, UA SMALL (*) NEGATIVE    Assessment:  The encounter diagnosis was Diverticulitis.  Plan:   1.  The following meds were prescribed:   New Prescriptions   CIPROFLOXACIN (CIPRO) 500 MG TABLET    Take 1 tablet (500 mg total) by mouth every 12 (twelve) hours.   METRONIDAZOLE (FLAGYL) 500 MG TABLET    Take 1 tablet (500 mg total) by mouth 3 (three) times daily.   2.  The patient was instructed in symptomatic care and handouts were given. 3.  The patient was told to return if becoming worse in any way, if no better in 3 or 4 days, and given some red flag symptoms that would indicate earlier return.    Reuben Likes, MD 04/19/12 1539

## 2012-04-19 NOTE — ED Notes (Signed)
Pt  States  She  Has  A  History of  Diverticulitis    Side   Reports  llq  Pain        For  A  Few  Days   She had  Flair  Up  About  1   mionth   Ago  She  Took  Some  Anti biotics  And   Get better  But  Symptoms  Returned   -  She  Is  Walking upright with a  Steady  Fluid  Gait

## 2012-09-13 ENCOUNTER — Encounter: Payer: Self-pay | Admitting: Obstetrics & Gynecology

## 2012-09-13 ENCOUNTER — Telehealth: Payer: Self-pay

## 2012-09-13 ENCOUNTER — Ambulatory Visit (INDEPENDENT_AMBULATORY_CARE_PROVIDER_SITE_OTHER): Payer: 59 | Admitting: Obstetrics & Gynecology

## 2012-09-13 VITALS — BP 134/79 | HR 77 | Resp 16 | Ht 64.0 in | Wt 159.0 lb

## 2012-09-13 DIAGNOSIS — N6310 Unspecified lump in the right breast, unspecified quadrant: Secondary | ICD-10-CM

## 2012-09-13 DIAGNOSIS — Z01419 Encounter for gynecological examination (general) (routine) without abnormal findings: Secondary | ICD-10-CM

## 2012-09-13 DIAGNOSIS — Z0000 Encounter for general adult medical examination without abnormal findings: Secondary | ICD-10-CM

## 2012-09-13 DIAGNOSIS — N63 Unspecified lump in unspecified breast: Secondary | ICD-10-CM

## 2012-09-13 DIAGNOSIS — N9089 Other specified noninflammatory disorders of vulva and perineum: Secondary | ICD-10-CM

## 2012-09-13 DIAGNOSIS — R928 Other abnormal and inconclusive findings on diagnostic imaging of breast: Secondary | ICD-10-CM

## 2012-09-13 NOTE — Telephone Encounter (Signed)
Left message for patient to let her know that she needs a diagnostic mammogram before she can have the breast MRI. Per Dr. Renato Gails at [facility] [phone])

## 2012-09-13 NOTE — Addendum Note (Signed)
Addended by: Perry Mount L on: 09/13/2012 11:10 AM   Modules accepted: Orders

## 2012-09-13 NOTE — Addendum Note (Signed)
Addended by: [name] on: 09/13/2012 09:56 AM   Modules accepted: Orders

## 2012-09-13 NOTE — Progress Notes (Signed)
Subjective:    [name] is a 57 y.o. female who presents for an annual exam. The patient has no complaints today. Her screening mammogram on the right was abnormal. She has compared the price of diagnostic mammogram and u/s with a MRI and she has decided that she would like a MRI of her right breast. I have discussed the higher false positive rate. She understands. The patient is not currently sexually active as her husband left her 6 months ago and moved back to [place]. She mentions a lesion on her left labia majora for the last 2 days that appears rarely and irregularly. GYN screening history: last pap: was normal. The patient wears seatbelts: yes. The patient participates in regular exercise: yes. Has the patient ever been transfused or tattooed?: no. The patient reports that there is not domestic violence in her life.   Menstrual History: OB History   Grav Para Term Preterm Abortions TAB SAB Ect Mult Living   3 2 2  1  1   2       Menarche age: 65  No LMP recorded. Patient has had a hysterectomy.    The following portions of the patient's history were reviewed and updated as appropriate: allergies, current medications, past family history, past medical history, past social history, past surgical history and problem list.  Review of Systems A comprehensive review of systems was negative. She works at [employer]. Her 57 yo son lives with her every other weekend while his house is having Holiday representative.   Objective:    BP 134/79  Pulse 77  Resp 16  Ht 5\' 4"  (1.626 m)  Wt 159 lb (72.122 kg)  BMI 27.28 kg/m2  General Appearance:    Alert, cooperative, no distress, appears stated age  Head:    Normocephalic, without obvious abnormality, atraumatic  Eyes:    PERRL, conjunctiva/corneas clear, EOM's intact, fundi    benign, both eyes  Ears:    Normal TM's and external ear canals, both ears  Nose:   Nares normal, septum midline, mucosa normal, no drainage    or sinus tenderness   Throat:   Lips, mucosa, and tongue normal; teeth and gums normal  Neck:   Supple, symmetrical, trachea midline, no adenopathy;    thyroid:  no enlargement/tenderness/nodules; no carotid   bruit or JVD  Back:     Symmetric, no curvature, ROM normal, no CVA tenderness  Lungs:     Clear to auscultation bilaterally, respirations unlabored  Chest Wall:    No tenderness or deformity   Heart:    Regular rate and rhythm, S1 and S2 normal, no murmur, rub   or gallop  Breast Exam:   dense breasts bilaterally. There is a 2.5 cm round lesion at the 11-1 o'clock position just above her nipple. This is at the site of a scar. No skin changes or nipple discharge  Abdomen:     Soft, non-tender, bowel sounds active all four quadrants,    no masses, no organomegaly  Genitalia:    Normal female without lesion, discharge or tenderness, herpetic lesion on the left labia majora, moderate to severe vulvovaginal atrophy, no masses with bimanual exam     Extremities:   Extremities normal, atraumatic, no cyanosis or edema  Pulses:   2+ and symmetric all extremities  Skin:   Skin color, texture, turgor normal, no rashes or lesions  Lymph nodes:   Cervical, supraclavicular, and axillary nodes normal  Neurologic:   CNII-XII intact, normal strength, sensation  and reflexes    throughout  .    Assessment:    Healthy female exam.  Herpetic lesion- She is not convinced that this is herpes. Breast mass above right nipple   Plan:     MRI of right breast ordered.  I will also go ahead and refer her to a general surgeon. She has been advised to discontinue all HRT. HSV2 IgG

## 2012-09-14 ENCOUNTER — Telehealth: Payer: Self-pay | Admitting: *Deleted

## 2012-09-14 LAB — HSV 2 ANTIBODY, IGG: HSV 2 Glycoprotein G Ab, IgG: 7.99 IV — ABNORMAL HIGH

## 2012-09-14 NOTE — Telephone Encounter (Signed)
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  She was offered a RX for Valtrex 1 GM PO BID x 7 days.  Pt satates that the area is better today and doesn't want the Valtrex called in.  She said that she will call if she has another outbreak.

## 2012-09-19 ENCOUNTER — Telehealth: Payer: Self-pay

## 2012-09-19 NOTE — Telephone Encounter (Signed)
Called patient made appt to see [facility] [address] [phone]) on May 12th at 4:30pm

## 2012-09-21 ENCOUNTER — Other Ambulatory Visit (HOSPITAL_COMMUNITY): Payer: 59

## 2012-09-25 ENCOUNTER — Encounter (INDEPENDENT_AMBULATORY_CARE_PROVIDER_SITE_OTHER): Payer: Self-pay | Admitting: General Surgery

## 2012-09-25 ENCOUNTER — Ambulatory Visit (INDEPENDENT_AMBULATORY_CARE_PROVIDER_SITE_OTHER): Payer: Commercial Managed Care - PPO | Admitting: General Surgery

## 2012-09-25 VITALS — BP 126/78 | HR 68 | Temp 98.0°F | Resp 16 | Ht 66.0 in | Wt 160.0 lb

## 2012-09-25 DIAGNOSIS — N6310 Unspecified lump in the right breast, unspecified quadrant: Secondary | ICD-10-CM

## 2012-09-25 DIAGNOSIS — N63 Unspecified lump in unspecified breast: Secondary | ICD-10-CM

## 2012-09-25 NOTE — Progress Notes (Addendum)
Patient ID: [name], female   DOB: March 20, 1956, 57 y.o.   MRN: [id number]  Chief Complaint  Patient presents with  . New Evaluation    eval rt breast mass near nipple    HPI [name] is a 56 y.o. female.  She was referred to me by Dr. Nicholaus Bloom in [place] for evaluation of a right breast mass.   The patient has a significant past history of a left lumpectomy in 2000 and [place]. She states that they told her she had "a few cancer cells", but no further therapy was necessary. She had a right breast biopsy in 2001 which was benign. She states that she has been tested for BRCA and she is BRCA2 positive and that this test was done by Dr. Seymour Bars, D.O. In [place]. Those records are pending.(See below)  She states that she gets annual mammograms. Recent bilateral screening mammograms were performed at [facility] regional wall and 08/04/2012. I have a disc of this and it shows a masslike area in the retroareolar area on the right it looks all at least 2 cm in size. This is very asymmetrical compared to the left. Her breasts are not that dense otherwise. She has had no other imaging studies. She has not had a biopsy yet.  She saw Dr. Marice Potter and  requested an MRI. Instead, she was referred to me for my valuation.  The patient lives in [place] but works as a Psychologist, sport and exercise at [employer]. She is from [place] originally. She moved from [place] to this area in 2005.  Family history is significant for ovarian cancer in the mother, lung cancer in the father and brother. There is no family history of breast cancer. HPI  Past Medical History  Diagnosis Date  . Ovarian cyst   . Breast calcification, left     Past Surgical History  Procedure Laterality Date  . Abdominal hysterectomy    . Breast surgery      Lt breast calcification    Family History  Problem Relation Age of Onset  . Cancer Mother     ovarian  . Cancer Brother     bone    Social  History History  Substance Use Topics  . Smoking status: Never Smoker   . Smokeless tobacco: Never Used  . Alcohol Use: No    Allergies  Allergen Reactions  . Penicillins     REACTION: rash    Current Outpatient Prescriptions  Medication Sig Dispense Refill  . Multiple Vitamins-Minerals (MULTIVITAMIN PO) Take by mouth daily.       No current facility-administered medications for this visit.    Review of Systems Review of Systems  Constitutional: Negative for fever, chills and unexpected weight change.  HENT: Negative for hearing loss, congestion, sore throat, trouble swallowing and voice change.   Eyes: Negative for visual disturbance.  Respiratory: Negative for cough and wheezing.   Cardiovascular: Negative for chest pain, palpitations and leg swelling.  Gastrointestinal: Negative for nausea, vomiting, abdominal pain, diarrhea, constipation, blood in stool, abdominal distention and anal bleeding.  Genitourinary: Negative for hematuria, vaginal bleeding and difficulty urinating.  Musculoskeletal: Negative for arthralgias.  Skin: Negative for rash and wound.  Neurological: Negative for seizures, syncope and headaches.  Hematological: Negative for adenopathy. Does not bruise/bleed easily.  Psychiatric/Behavioral: Negative for confusion.    Blood pressure 126/78, pulse 68, temperature 98 F (36.7 C), temperature source Temporal, resp. rate 16, height 5\' 6"  (1.676 m), weight 160 lb (  72.576 kg).  Physical Exam Physical Exam  Constitutional: She is oriented to person, place, and time. She appears well-developed and well-nourished. No distress.  HENT:  Head: Normocephalic and atraumatic.  Nose: Nose normal.  Mouth/Throat: No oropharyngeal exudate.  Eyes: Conjunctivae and EOM are normal. Pupils are equal, round, and reactive to light. Left eye exhibits no discharge. No scleral icterus.  Neck: Neck supple. No JVD present. No tracheal deviation present. No thyromegaly present.   Cardiovascular: Normal rate, regular rhythm, normal heart sounds and intact distal pulses.   No murmur heard. Pulmonary/Chest: Effort normal and breath sounds normal. No respiratory distress. She has no wheezes. She has no rales. She exhibits no tenderness.    Circumareolar scar superiorly right breast. 2.5 cm palpable mass at upper outer areolar margin on the right. Not involving skin. No adenopathy. Radially oriented scar left breast at 3:00. No palpable mass. No adenopathy.  Abdominal: Soft. Bowel sounds are normal. She exhibits no distension and no mass. There is no tenderness. There is no rebound and no guarding.  Musculoskeletal: She exhibits no edema and no tenderness.  Lymphadenopathy:    She has no cervical adenopathy.  Neurological: She is alert and oriented to person, place, and time. She exhibits normal muscle tone. Coordination normal.  Skin: Skin is warm. No rash noted. She is not diaphoretic. No erythema. No pallor.  Psychiatric: She has a normal mood and affect. Her behavior is normal. Judgment and thought content normal.    Data Reviewed Mammogram reports and mammogram images from [facility] regional. Office note from Dr. Marice Potter.  Assessment    2.5 cm right breast mass, central 12:00 position. Suspicious for neoplasia  Family history of ovarian cancer  Patient reports testing for BRCA and that she is positive or BRCA2. Addendum 09/26/2012. I have obtained her genetic testing report and she is indeed positive for BRCA2 sequencing. This was done following referral to a genetic counselor by Bud Face, DO  Remote history of left partial mastectomy, pathology unknown.  Remote history right breast biopsy for benign disease.     Plan    I had a long discussion with the patient and suggested an outline for how this should be worked up and treated. She is in agreement with this plan.  She'll be referred to the [facility] for further imaging and image guided biopsy.  I told  her that if  this was malignancy we will get an MRI  She will return to see me in one week.        Angelia Mould. Derrell Lolling, M.D., [facility] General and Minimally invasive Surgery Breast and Colorectal Surgery Office:   [phone] Pager:   [phone]  09/25/2012, 5:14 PM

## 2012-09-25 NOTE — Patient Instructions (Signed)
You have a 2 cm mass in your right breast just above the nipple. We can feel this on exam and we can also see this on the mammogram.  The next step is for  you to have better imaging and possibly an image guided biopsy. We will schedule that for you.  We are also going to request the BRCA testing that was done by Dr. Cathey Endow in [place].  Return to see Dr. Derrell Lolling within one week after the above tests are done.

## 2012-09-26 ENCOUNTER — Other Ambulatory Visit (INDEPENDENT_AMBULATORY_CARE_PROVIDER_SITE_OTHER): Payer: Self-pay

## 2012-09-26 ENCOUNTER — Telehealth (INDEPENDENT_AMBULATORY_CARE_PROVIDER_SITE_OTHER): Payer: Self-pay | Admitting: General Surgery

## 2012-09-26 DIAGNOSIS — N6310 Unspecified lump in the right breast, unspecified quadrant: Secondary | ICD-10-CM

## 2012-09-26 NOTE — Telephone Encounter (Signed)
Spoke with patient she is aware of MM and u/s BX if needed at breast center 10/06/12 1:15

## 2012-10-06 ENCOUNTER — Ambulatory Visit
Admission: RE | Admit: 2012-10-06 | Discharge: 2012-10-06 | Disposition: A | Discharge status: Home / Self Care | Payer: 59 | Source: Ambulatory Visit | Attending: General Surgery | Admitting: General Surgery

## 2012-10-06 ENCOUNTER — Other Ambulatory Visit (INDEPENDENT_AMBULATORY_CARE_PROVIDER_SITE_OTHER): Payer: Self-pay | Admitting: General Surgery

## 2012-10-06 DIAGNOSIS — N6310 Unspecified lump in the right breast, unspecified quadrant: Secondary | ICD-10-CM

## 2012-10-10 ENCOUNTER — Ambulatory Visit
Admission: RE | Admit: 2012-10-10 | Discharge: 2012-10-10 | Disposition: A | Discharge status: Home / Self Care | Payer: 59 | Source: Ambulatory Visit | Attending: General Surgery | Admitting: General Surgery

## 2012-10-10 ENCOUNTER — Other Ambulatory Visit (INDEPENDENT_AMBULATORY_CARE_PROVIDER_SITE_OTHER): Payer: Self-pay | Admitting: General Surgery

## 2012-10-10 DIAGNOSIS — C50912 Malignant neoplasm of unspecified site of left female breast: Secondary | ICD-10-CM

## 2012-10-10 DIAGNOSIS — C50911 Malignant neoplasm of unspecified site of right female breast: Secondary | ICD-10-CM

## 2012-10-10 DIAGNOSIS — N6310 Unspecified lump in the right breast, unspecified quadrant: Secondary | ICD-10-CM

## 2012-10-12 ENCOUNTER — Ambulatory Visit (INDEPENDENT_AMBULATORY_CARE_PROVIDER_SITE_OTHER): Payer: Commercial Managed Care - PPO | Admitting: General Surgery

## 2012-10-12 ENCOUNTER — Encounter (INDEPENDENT_AMBULATORY_CARE_PROVIDER_SITE_OTHER): Payer: Self-pay

## 2012-10-12 ENCOUNTER — Encounter (INDEPENDENT_AMBULATORY_CARE_PROVIDER_SITE_OTHER): Payer: Self-pay | Admitting: General Surgery

## 2012-10-12 VITALS — BP 128/82 | HR 84 | Temp 98.4°F | Resp 18 | Ht 65.0 in | Wt 156.0 lb

## 2012-10-12 DIAGNOSIS — C50919 Malignant neoplasm of unspecified site of unspecified female breast: Secondary | ICD-10-CM

## 2012-10-12 DIAGNOSIS — C50119 Malignant neoplasm of central portion of unspecified female breast: Secondary | ICD-10-CM

## 2012-10-12 DIAGNOSIS — C50111 Malignant neoplasm of central portion of right female breast: Secondary | ICD-10-CM | POA: Insufficient documentation

## 2012-10-12 DIAGNOSIS — C50911 Malignant neoplasm of unspecified site of right female breast: Secondary | ICD-10-CM

## 2012-10-12 NOTE — Patient Instructions (Signed)
Your recent x-ray guided biopsies show an invasive cancer of the right breast in the subareolar area as well as lymph node metastasis to the right axilla.  We have talked about different surgical options including lumpectomy and axillary lymph node dissection, right mastectomy, and because of your genetic mutation, bilateral mastectomy. We have talked about chemotherapy and radiation therapy.  You have  stated that your preference is to simply have a lumpectomy and axillary lymph node dissection. That is a reasonable option.  You will be scheduled for bilateral breast MRI to make sure that you do not have a second cancer somewhere.  You will be referred to medical oncology to discuss preoperative chemotherapy to see if we can shrink the tumor down. This will help reduce the amount of tissue removed at the time of lumpectomy.  If we are able to do a lumpectomy, we will have to remove the entire nipple and areola because the tumor is immediately behind this. We discussed this today.  You will be given an appointment to see Dr. Derrell Lolling in 3 weeks. If we need to place a Port-A-Cath sooner, we will arrange that.

## 2012-10-12 NOTE — Progress Notes (Signed)
Patient ID: [name], female   DOB: 1955-06-23, 57 y.o.   MRN: [id number]  Chief Complaint  Patient presents with  . Breast Cancer    HPI [name] is a 57 y.o. female.  She returns for further discussion regarding management of her newly diagnosed right breast cancer which is metastatic to her right axilla.  She was referred to the breast center [facility] where she had mammograms and ultrasound. They found a 2.4 cm mass in the right breast at 12:00 in the subareolar area. The right axillary lymph nodes looked abnormal. 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 There is metastasis to the right axillary lymph nodes. She has not had an MRI yet.  We discussed her pathology in great detail. I gave her a copy of the pathology report. I told her that the breast diagnostic profile is pending and have that might influence treatment plans. We talked about the fact that a lumpectomy would leave a big defect at this time and also would require the removal of the nipple and areola. The told her that if she desired lumpectomy that we need to consider neoadjuvant chemotherapy to down stage the tumor. I told her that she probably needs chemotherapy regardless because of her positive node status. I advised medical oncology consultation and she is interested in that. Consultation with the radiation oncologist is also advised.We also talked about  right modified radical mastectomy. We also talked about contralateral prophylactic mastectomy, in light of her BRCA2 genetic mutation. She is aware that an oophorectomy is advised at some point.  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 She lives in [place] and is from [place] originally.  History is significant for ovarian cancer in the mother, lung cancer the father and brother. There is no family history of breast cancer. She was previously seen by Dr. Welton Flakes in  the high risk clinic, and genetic counseling and testing was performed.  HPI  Past Medical History  Diagnosis Date  . Ovarian cyst   . Breast calcification, left     Past Surgical History  Procedure Laterality Date  . Abdominal hysterectomy    . Breast surgery      Lt breast calcification    Family History  Problem Relation Age of Onset  . Cancer Mother     ovarian  . Cancer Brother     bone    Social History History  Substance Use Topics  . Smoking status: Never Smoker   . Smokeless tobacco: Never Used  . Alcohol Use: No    Allergies  Allergen Reactions  . Penicillins     REACTION: rash    Current Outpatient Prescriptions  Medication Sig Dispense Refill  . Multiple Vitamins-Minerals (MULTIVITAMIN PO) Take by mouth daily.       No current facility-administered medications for this visit.    Review of Systems Review of Systems  Constitutional: Negative for fever, chills and unexpected weight change.  HENT: Negative for hearing loss, congestion, sore throat, trouble swallowing and voice change.   Eyes: Negative for visual disturbance.  Respiratory: Negative for cough and wheezing.   Cardiovascular: Negative for chest pain, palpitations and leg swelling.  Gastrointestinal: Negative for nausea, vomiting, abdominal pain, diarrhea, constipation, blood in stool, abdominal distention and anal bleeding.  Genitourinary: Negative for hematuria, vaginal bleeding and difficulty urinating.  Musculoskeletal: Negative for arthralgias.  Skin: Negative for rash and wound.  Neurological: Negative  for seizures, syncope and headaches.  Hematological: Negative for adenopathy. Does not bruise/bleed easily.  Psychiatric/Behavioral: Negative for confusion.    Blood pressure 128/82, pulse 84, temperature 98.4 F (36.9 C), temperature source Oral, resp. rate 18, height 5\' 5"  (1.651 m), weight 156 lb (70.761 kg).  Physical Exam Physical Exam  Constitutional: She is oriented to  person, place, and time. She appears well-developed and well-nourished. No distress.  HENT:  Head: Normocephalic and atraumatic.  Nose: Nose normal.  Mouth/Throat: No oropharyngeal exudate.  Eyes: Conjunctivae and EOM are normal. Pupils are equal, round, and reactive to light. Left eye exhibits no discharge. No scleral icterus.  Neck: Neck supple. No JVD present. No tracheal deviation present. No thyromegaly present.  Cardiovascular: Normal rate, regular rhythm, normal heart sounds and intact distal pulses.   No murmur heard. Pulmonary/Chest: Effort normal and breath sounds normal. No respiratory distress. She has no wheezes. She has no rales. She exhibits no tenderness.  2.5 cm palpable mass at the upper and areolar margin on the right. Not obviously invading skin. Radially oriented scar left breast at 3:00. Circumareolar scar on the right noted previously.  Abdominal: Soft. Bowel sounds are normal. She exhibits no distension and no mass. There is no tenderness. There is no rebound and no guarding.  Musculoskeletal: She exhibits no edema and no tenderness.  Lymphadenopathy:    She has no cervical adenopathy.  Neurological: She is alert and oriented to person, place, and time. She exhibits normal muscle tone. Coordination normal.  Skin: Skin is warm. No rash noted. She is not diaphoretic. No erythema. No pallor.  Psychiatric: She has a normal mood and affect. Her behavior is normal. Judgment and thought content normal.    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 Stage cT2, N1.  BRCA2 mutation  Family history of ovarian cancer  Remote history left partial mastectomy, pathology unknown  Remote history of right breast biopsy for benign disease     Plan    We had a very long discussion today about the coordination of  multidisciplinary care. We talked about neoadjuvant chemotherapy, adjuvant chemotherapy, adjuvant radiotherapy. We talked about lumpectomy and axillary lymph node dissection, right modified radical mastectomy, contralateral mastectomy, and reconstruction issues.  Her initial desire is to have breast conservation.  She will be rescheduled for bilateral breast MRI which she is aware that if they found a second cancer this might change her options for treatment  She'll be referred back to Dr. Drue Second for medical oncology consultation. Hopefully she can see radiation oncology.  If the decision is made to give chemotherapy, and  she wishes to preserve her breast, then I would favor neoadjuvant chemotherapy. We will standby to schedule Port-A-Cath as requested.  She'll be given an appointment to see me in 3 weeks, regardless.        Angelia Mould. Derrell Lolling, M.D., [facility] General and Minimally invasive Surgery Breast and Colorectal Surgery Office:   [phone] Pager:   [phone]  10/12/2012, 11:48 AM

## 2012-10-15 ENCOUNTER — Other Ambulatory Visit: Payer: 59

## 2012-10-15 HISTORY — PX: BREAST BIOPSY: SHX20

## 2012-10-16 ENCOUNTER — Telehealth: Payer: Self-pay | Admitting: *Deleted

## 2012-10-16 NOTE — Telephone Encounter (Signed)
Left message for pt to return my call so I can schedule her a Med Onc appt w/ Dr. Welton Flakes.

## 2012-10-18 ENCOUNTER — Encounter: Payer: Self-pay | Admitting: Oncology

## 2012-10-18 ENCOUNTER — Telehealth: Payer: Self-pay | Admitting: *Deleted

## 2012-10-18 NOTE — Telephone Encounter (Signed)
Pt returned my call and I confirmed 10/23/12 appt w/ pt.  Mailed before appt letter & packet to pt.  Emailed Huntley Dec at [facility] to make her aware.  Took paperwork to Med Rec for chart.

## 2012-10-19 ENCOUNTER — Ambulatory Visit (HOSPITAL_COMMUNITY)
Admission: RE | Admit: 2012-10-19 | Discharge: 2012-10-19 | Disposition: A | Discharge status: Home / Self Care | Payer: 59 | Source: Ambulatory Visit | Attending: General Surgery | Admitting: General Surgery

## 2012-10-19 DIAGNOSIS — R599 Enlarged lymph nodes, unspecified: Secondary | ICD-10-CM | POA: Insufficient documentation

## 2012-10-19 DIAGNOSIS — I517 Cardiomegaly: Secondary | ICD-10-CM | POA: Insufficient documentation

## 2012-10-19 DIAGNOSIS — C50911 Malignant neoplasm of unspecified site of right female breast: Secondary | ICD-10-CM

## 2012-10-19 DIAGNOSIS — C50919 Malignant neoplasm of unspecified site of unspecified female breast: Secondary | ICD-10-CM | POA: Insufficient documentation

## 2012-10-19 MED ORDER — GADOBENATE DIMEGLUMINE 529 MG/ML IV SOLN
15.0000 mL | Freq: Once | INTRAVENOUS | Status: AC | PRN
  Administered 2012-10-19: 14 mL via INTRAVENOUS

## 2012-10-20 ENCOUNTER — Other Ambulatory Visit: Payer: Self-pay | Admitting: Medical Oncology

## 2012-10-20 DIAGNOSIS — C50119 Malignant neoplasm of central portion of unspecified female breast: Secondary | ICD-10-CM

## 2012-10-23 ENCOUNTER — Other Ambulatory Visit (HOSPITAL_BASED_OUTPATIENT_CLINIC_OR_DEPARTMENT_OTHER): Payer: 59

## 2012-10-23 ENCOUNTER — Encounter: Payer: Self-pay | Admitting: Oncology

## 2012-10-23 ENCOUNTER — Other Ambulatory Visit (INDEPENDENT_AMBULATORY_CARE_PROVIDER_SITE_OTHER): Payer: Self-pay | Admitting: General Surgery

## 2012-10-23 ENCOUNTER — Ambulatory Visit (HOSPITAL_BASED_OUTPATIENT_CLINIC_OR_DEPARTMENT_OTHER): Payer: 59

## 2012-10-23 ENCOUNTER — Ambulatory Visit (HOSPITAL_BASED_OUTPATIENT_CLINIC_OR_DEPARTMENT_OTHER): Payer: 59 | Admitting: Oncology

## 2012-10-23 VITALS — BP 158/83 | HR 69 | Temp 98.3°F | Resp 20 | Ht 65.0 in | Wt 156.5 lb

## 2012-10-23 DIAGNOSIS — C50119 Malignant neoplasm of central portion of unspecified female breast: Secondary | ICD-10-CM

## 2012-10-23 DIAGNOSIS — R928 Other abnormal and inconclusive findings on diagnostic imaging of breast: Secondary | ICD-10-CM

## 2012-10-23 DIAGNOSIS — C50111 Malignant neoplasm of central portion of right female breast: Secondary | ICD-10-CM

## 2012-10-23 LAB — CBC WITH DIFFERENTIAL/PLATELET
BASO%: 0.6 % (ref 0.0–2.0)
Eosinophils Absolute: 0.2 10*3/uL (ref 0.0–0.5)
MCHC: 33.6 g/dL (ref 31.5–36.0)
MONO#: 0.3 10*3/uL (ref 0.1–0.9)
NEUT#: 2.6 10*3/uL (ref 1.5–6.5)
RBC: 4.6 10*6/uL (ref 3.70–5.45)
WBC: 5 10*3/uL (ref 3.9–10.3)
lymph#: 1.9 10*3/uL (ref 0.9–3.3)

## 2012-10-23 LAB — COMPREHENSIVE METABOLIC PANEL (CC13)
ALT: 22 U/L (ref 0–55)
Albumin: 3.9 g/dL (ref 3.5–5.0)
CO2: 28 mEq/L (ref 22–29)
Calcium: 9.6 mg/dL (ref 8.4–10.4)
Chloride: 106 mEq/L (ref 98–107)
Potassium: 3.9 mEq/L (ref 3.5–5.1)
Sodium: 143 mEq/L (ref 136–145)
Total Protein: 7.5 g/dL (ref 6.4–8.3)

## 2012-10-23 NOTE — Progress Notes (Signed)
[name] [id number] [birth year] 57 y.o. 10/23/2012 4:26 PM  CC  [name]., MD [address] [place] [address] Dr. 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 Patient is seen in medical oncology for discussion of treatment options.  STAGE:   Cancer of central portion of female breast   Primary site: Breast (Right)   Staging method: AJCC 7th Edition   Clinical: Stage IIB (T2, N1, cM0) signed by Victorino December, MD on 11/17/2012 12:30 PM   Summary: Stage IIB (T2, N1, cM0)   REFERRING PHYSICIAN: Dr. Claud Kelp  HISTORY OF PRESENT ILLNESS:  [name] is a 57 y.o. female.  Was a history of BRCA2 gene mutation. Patient also has a history of left breast biopsy that showed what she tells me was a breast cancer. She underwent a lumpectomy in [place] 2000. She thereafter did not receive any standard therapy but instead [employer] therapy in [place].. And apparently has been cured of that disease. Most recently patient had a mammogram performed and she was found to have a 2.4 cm mass in the right breast at the 12:00 position in the subareolar region. She also was noted to have a abnormal right axillary lymph node. 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 The lymph node was biopsied as well and that was also positive for metastatic carcinoma. Patient subsequently went on to have MRI of the breasts performed in the right breast MRI showed 2.5 x 2.7 x 2.5 cm irregular enhancing mass at the 12:00 position of the right breast anterior third. It contained biopsy clip artifact. She also was found to have a second 3 x 7 x 3 mm circumscribed oval enhancing mass within the slightly upper outer  right breast at the junction of the middle and posterior thirds. In the left breast there were no suspicious findings. The lymph nodes on the right revealed enlarged level I with biopsy changes. No other enlarged or abnormal appearing lymph nodes were identified. Patient was seen by Dr. Claud Kelp he recommended neoadjuvant approach to chemotherapy initially followed by surgery. And patient is therefore seen in medical oncology for discussion of treatment options. She is without any complaints. She also tells me that she has really not made up her mind as to what she wants to do at this point but certainly she is open to discussions. Her case was discussed at the multidisciplinary breast conference. Her radiology and pathology were reviewed.   Past Medical History: Past Medical History  Diagnosis Date  . Ovarian cyst   . Breast calcification, left   . Breast cancer     Past Surgical History: Past Surgical History  Procedure Laterality Date  . Abdominal hysterectomy    . Breast surgery      Lt breast calcification    Family History: Family History  Problem Relation Age of Onset  . Cancer Mother     ovarian  . Cancer Brother     bone    Social History History  Substance Use Topics  . Smoking status: Never Smoker   . Smokeless tobacco: Never Used  . Alcohol Use: No    Allergies: Allergies  Allergen Reactions  . Penicillins  REACTION: rash    Current Medications: Current Outpatient Prescriptions  Medication Sig Dispense Refill  . Multiple Vitamins-Minerals (MULTIVITAMIN PO) Take by mouth daily.       No current facility-administered medications for this visit.    OB/GYN History: menarche at 35, menopause at age 65, G51 P2, first live birth was at 51  Fertility Discussion: N/A Prior History of Cancer: left breast lumpectomy with DCIS, no radiation and Took alternative received  Health Maintenance:  Colonoscopy yes Bone Density yes 2004 Last PAP smear  2012  ECOG PERFORMANCE STATUS: 0 - Asymptomatic  Genetic Counseling/testing: Patient is a known BRCA2 mutation carrier  REVIEW OF SYSTEMS: Patient had no complaints of full 14 point review of systems is scant separately into the electronic medical record PHYSICAL EXAMINATION: Blood pressure 158/83, pulse 69, temperature 98.3 F (36.8 C), temperature source Oral, resp. rate 20, height 5\' 5"  (1.651 m), weight 156 lb 8 oz (70.988 kg).  Well-developed well-nourished female in no acute distress HEENT exam EOMI PERRLA sclerae anicteric no conjunctival pallor oral mucosa is moist neck is supple lungs are clear bilaterally to auscultation cardiovascular is regular rate rhythm abdomen is soft nontender nondistended bowel sounds are present no HSM extremities no clubbing edema or cyanosis neuro patient's alert oriented otherwise nonfocal Right breast does reveal a palpable mass at the 12:00 position just behind the nipple it is slightly tender there is no overlying skin changes. Left breast reveals a healed surgical scar from prior lumpectomy    STUDIES/RESULTS: US Breast Right  10/06/2012   *RADIOLOGY REPORT*  Clinical Data:  Called back from screening mammogram for possible mass right breast  DIGITAL DIAGNOSTIC RIGHT MAMMOGRAM Oct 06, 2012 AND RIGHT BREAST ULTRASOUND:  Comparison:  August 04, 2012, July 23, 2011  Findings:  ACR Breast Density Category scattered fibroglandular tissue  Spot compression CC and MLO views of the right breast are submitted.  There is a spiculated mass in the retroareolar right breast.  There are enlarged lymph nodes within the right axilla.  Ultrasound is performed, showing spiculated hypoechoic mass at the right breast 12 o'clock subareolar region measuring 2.2 x 2 x 2.4 cm.  There are abnormal enlarged lymph nodes within the right axilla, measuring 1.2 x 1.1 x 1.4 cm.  IMPRESSION: Highly suspicious findings.  RECOMMENDATION: Ultrasound guided core biopsies of right breast at  mass and abnormal enlarged right axillary lymph node.  I have discussed the findings and recommendations with the patient. Results were also provided in writing at the conclusion of the visit.  If applicable, a reminder letter will be sent to the patient regarding the next appointment.  BI-RADS CATEGORY 5:  Highly suggestive of malignancy - appropriate action should be taken.   Original Report Authenticated By: Sherian Rein, M.D.   Mr Breast Bilateral W Wo Contrast  10/19/2012   *RADIOLOGY REPORT*  Clinical Data: 57 year old female with newly diagnosed right breast invasive ductal carcinoma and DCIS.  BILATERAL BREAST MRI WITH AND WITHOUT CONTRAST THREE-DIMENSIONAL MR IMAGE RENDERING ON INDEPENDENT WORKSTATION:  Technique: Multiplanar, multisequence MR images of both breasts were obtained prior to and following the intravenous administration of 14ml of Multihance next the.  Three dimensional images were evaluated at the independent DynaCad workstation. Three-dimensional MR images were rendered by post-processing of the original MR data on an independent workstation.  The three- dimensional MR images were interpreted, and findings were reported in the accompanying complete MRI report for this study.  Comparison:  [facility]  Findings: BREAST PARENCHYMAL ENHANCEMENT:  Mild  RIGHT BREAST:  A 2.5 x 2.7 x 2.5 cm (transverse X AP X CC) irregular enhancing mass with rapid washout kinetics is identified at the 12 o'clock position of the right breast, anterior third. This mass contains biopsy clip artifact compatible with biopsy- proven neoplasm. A 3 x 7 x 3 mm circumscribed oval enhancing mass within the slightly upper outer right breast (at the junction of the middle and posterior thirds) exhibits rapid washout kinetics and is indeterminate. No other suspicious areas of enhancement are identified.  LEFT BREAST:  No suspicious areas of enhancement are identified.  LYMPH  NODES:  An enlarged level I right axillary lymph node with biopsy changes is compatible with biopsy-proven metastatic disease. No other enlarged or abnormal-appearing lymph nodes are identified.  ANCILLARY FINDINGS:  Cardiomegaly is present.  IMPRESSION: 2.5 x 2.7 x 2.5 cm biopsy-proven neoplasm within the upper right breast with enlarged biopsy-proven level I right axillary lymph node.  Indeterminate 3 x 7 x 3 mm circumscribed oval enhancing mass within the upper outer right breast.  If breast conservation is considered, recommend MR guided biopsy.  No evidence of contralateral disease.  Cardiomegaly.  BI-RADS CATEGORY 4:  Suspicious abnormality - biopsy should be considered.  RECOMMENDATION: MR guided right breast biopsy as described above.  This biopsy will be arranged by our office.   Original Report Authenticated By: Harmon Pier, M.D.   Mm Digital Diagnostic Unilat R  10/06/2012   *RADIOLOGY REPORT*  Clinical Data:  Status post sound guided core biopsy right breast mass.  DIGITAL DIAGNOSTIC RIGHT MAMMOGRAM  Comparison:  Previous exams.  Findings:  Films are performed following right breast ultrasound guided biopsy of spiculated mass 12 o'clock position.  CC and lateral view of the right  breast demonstrate biopsy clip in the mass of concern.  IMPRESSION: Post biopsy mammogram demonstrate biopsy clip in the mass of concern.   Original Report Authenticated By: Sherian Rein, M.D.   Mm Digital Diagnostic Unilat R  10/06/2012   *RADIOLOGY REPORT*  Clinical Data:  Called back from screening mammogram for possible mass right breast  DIGITAL DIAGNOSTIC RIGHT MAMMOGRAM Oct 06, 2012 AND RIGHT BREAST ULTRASOUND:  Comparison:  August 04, 2012, July 23, 2011  Findings:  ACR Breast Density Category scattered fibroglandular tissue  Spot compression CC and MLO views of the right breast are submitted.  There is a spiculated mass in the retroareolar right breast.  There are enlarged lymph nodes within the right axilla.   Ultrasound is performed, showing spiculated hypoechoic mass at the right breast 12 o'clock subareolar region measuring 2.2 x 2 x 2.4 cm.  There are abnormal enlarged lymph nodes within the right axilla, measuring 1.2 x 1.1 x 1.4 cm.  IMPRESSION: Highly suspicious findings.  RECOMMENDATION: Ultrasound guided core biopsies of right breast at mass and abnormal enlarged right axillary lymph node.  I have discussed the findings and recommendations with the patient. Results were also provided in writing at the conclusion of the visit.  If applicable, a reminder letter will be sent to the patient regarding the next appointment.  BI-RADS CATEGORY 5:  Highly suggestive of malignancy - appropriate action should be taken.   Original Report Authenticated By: Sherian Rein, M.D.   Mm Radiologist Eval And Mgmt  10/10/2012   *RADIOLOGY REPORT*  ESTABLISHED PATIENT OFFICE VISIT - LEVEL II [phone])  Chief Complaint:  The patient returns on 10/10/12 for wound site check and results from ultrasound-guided  core biopsies of the right breast and right axilla.  History:  The patient was called back from a screening mammogram for a possible mass in the right breast.  Diagnostic workup showed a suspicious right breast mass as well as axillary adenopathy. Ultrasound-guided core biopsies were performed.  Exam:  The wound sites are clean and dry with no significant hematoma or signs of infection.  Pathology: Grade 1-2 invasive ductal carcinoma with DCIS was reported histologically.  The lymph node was positive for metastatic disease.  Results of the biopsy were discussed with the patient.  Assessment and Plan:  The patient has been scheduled a surgical consultation with Dr. Derrell Lolling on 10/12/12.  Breast MRI has been arranged.   Original Report Authenticated By: Baird Lyons, M.D.   [place] Rt Breast Bx W Loc Dev 1st Lesion Img Bx Spec US Guide  10/06/2012   *RADIOLOGY REPORT*  Clinical Data:  Right breast mass and enlarged right axillary lymph node  for biopsy.  ULTRASOUND GUIDED CORE BIOPSY OF THE right BREAST  Comparison: Previous exams.  I met with the patient and we discussed the procedure of ultrasound- guided biopsy, including benefits and alternatives.  We discussed the high likelihood of a successful procedure. We discussed the risks of the procedure, including infection, bleeding, tissue injury, clip migration, and inadequate sampling.  Informed written consent was given.  Using sterile technique  lidocaine, ultrasound guidance and a 14 gauge automated biopsy device, biopsy was performed of right breast mass 12 o'clock position using a lateral approach.  At the conclusion of the procedure a  tissue marker clip was deployed into the biopsy cavity.  Follow up 2 view mammogram was performed and dictated separately.  Using sterile technique . lidocaine, ultrasound guidance and a 14 gauge automated biopsy device, biopsy was performed of  enlarged right axillary lymph node. using a   lateral. approach.  At the conclusion of the procedure,  no tissue marker clip was deployed into the biopsy cavity.  IMPRESSION: Ultrasound guided biopsy of right breast and right axilla.  No apparent complications.   Original Report Authenticated By: Sherian Rein, M.D.   [place] Rt Breast Bx W Loc Dev Ea Add Lesion Img Bx Spec US Guide  10/06/2012   *RADIOLOGY REPORT*  Clinical Data:  Right breast mass and enlarged right axillary lymph node for biopsy.  ULTRASOUND GUIDED CORE BIOPSY OF THE right BREAST  Comparison: Previous exams.  I met with the patient and we discussed the procedure of ultrasound- guided biopsy, including benefits and alternatives.  We discussed the high likelihood of a successful procedure. We discussed the risks of the procedure, including infection, bleeding, tissue injury, clip migration, and inadequate sampling.  Informed written consent was given.  Using sterile technique  lidocaine, ultrasound guidance and a 14 gauge automated biopsy device, biopsy was  performed of right breast mass 12 o'clock position using a lateral approach.  At the conclusion of the procedure a  tissue marker clip was deployed into the biopsy cavity.  Follow up 2 view mammogram was performed and dictated separately.  Using sterile technique . lidocaine, ultrasound guidance and a 14 gauge automated biopsy device, biopsy was performed of  enlarged right axillary lymph node. using a   lateral. approach.  At the conclusion of the procedure,  no tissue marker clip was deployed into the biopsy cavity.  IMPRESSION: Ultrasound guided biopsy of right breast and right axilla.  No apparent complications.   Original Report Authenticated By: Sherian Rein, M.D.  LABS:    Chemistry      Component Value Date/Time   NA 143 10/23/2012 1459   NA 143 08/18/2011 1056   K 3.9 10/23/2012 1459   K 4.4 08/18/2011 1056   CL 106 10/23/2012 1459   CL 104 08/18/2011 1056   CO2 28 10/23/2012 1459   CO2 27 08/18/2011 1056   BUN 13.3 10/23/2012 1459   BUN 18 08/18/2011 1056   CREATININE 0.9 10/23/2012 1459   CREATININE 0.82 08/18/2011 1056   CREATININE 0.76 06/09/2009 2004      Component Value Date/Time   CALCIUM 9.6 10/23/2012 1459   CALCIUM 9.8 08/18/2011 1056   ALKPHOS 94 10/23/2012 1459   ALKPHOS 92 08/18/2011 1056   AST 17 10/23/2012 1459   AST 21 08/18/2011 1056   ALT 22 10/23/2012 1459   ALT 22 08/18/2011 1056   BILITOT 0.33 10/23/2012 1459   BILITOT 0.4 08/18/2011 1056      Lab Results  Component Value Date   WBC 5.0 10/23/2012   HGB 13.0 10/23/2012   HCT 38.8 10/23/2012   MCV 84.4 10/23/2012   PLT 262 10/23/2012    PATHOLOGY: ADDITIONAL INFORMATION: 1. CHROMOGENIC IN-SITU HYBRIDIZATION Results: HER-2/NEU BY CISH - NO AMPLIFICATION OF HER-2 DETECTED. RESULT RATIO OF HER2: CEP 17 SIGNALS 0.93 AVERAGE HER2 COPY NUMBER PER CELL 2.10 REFERENCE RANGE NEGATIVE HER2/Chr17 Ratio <2.0 and Average HER2 copy number <4.0 EQUIVOCAL HER2/Chr17 Ratio <2.0 and Average HER2 copy number 4.0 and <6.0 POSITIVE HER2/Chr17 Ratio  >=2.0 and/or Average HER2 copy number >=6.0 Jimmy Picket MD Pathologist, Electronic Signature ( Signed 10/16/2012) 1. PROGNOSTIC INDICATORS - ACIS Results: IMMUNOHISTOCHEMICAL AND MORPHOMETRIC ANALYSIS BY THE AUTOMATED CELLULAR IMAGING SYSTEM (ACIS) Estrogen Receptor: 100%, POSITIVE, STRONG STAINING INTENSITY Progesterone Receptor: 29%, POSITIVE, STRONG STAINING INTENSITY Proliferation Marker Ki67: 89% REFERENCE RANGE ESTROGEN RECEPTOR NEGATIVE <1% POSITIVE =>1% PROGESTERONE RECEPTOR NEGATIVE <1% 1 of 4 FINAL for [place], Madelin ([id number]) ADDITIONAL INFORMATION:(continued) POSITIVE =>1% All controls stained appropriately Pecola Leisure MD Pathologist, Electronic Signature ( Signed 10/16/2012) 2. CHROMOGENIC IN-SITU HYBRIDIZATION Results: HER-2/NEU BY CISH - NO AMPLIFICATION OF HER-2 DETECTED. RESULT RATIO OF HER2: CEP 17 SIGNALS 0.98 AVERAGE HER2 COPY NUMBER PER CELL 2.30 REFERENCE RANGE NEGATIVE HER2/Chr17 Ratio <2.0 and Average HER2 copy number <4.0 EQUIVOCAL HER2/Chr17 Ratio <2.0 and Average HER2 copy number 4.0 and <6.0 POSITIVE HER2/Chr17 Ratio >=2.0 and/or Average HER2 copy number >=6.0 Jimmy Picket MD Pathologist, Electronic Signature ( Signed 10/16/2012) 2. PROGNOSTIC INDICATORS - ACIS Results: IMMUNOHISTOCHEMICAL AND MORPHOMETRIC ANALYSIS BY THE AUTOMATED CELLULAR IMAGING SYSTEM (ACIS) Estrogen Receptor: 100%, POSITIVE, STRONG STAINING INTENSITY Progesterone Receptor: 45%, POSITIVE, STRONG STAINING INTENSITY Proliferation Marker Ki67: 83% REFERENCE RANGE ESTROGEN RECEPTOR NEGATIVE <1% POSITIVE =>1% PROGESTERONE RECEPTOR NEGATIVE <1% POSITIVE =>1% All controls stained appropriately Pecola Leisure MD Pathologist, Electronic Signature ( Signed 10/16/2012) 2 of 4 FINAL for [facility], [place]an ([id number]) FINAL DIAGNOSIS Diagnosis 1. Breast, right, needle core biopsy, mass - INVASIVE DUCTAL CARCINOMA. - DUCTAL CARCINOMA IN SITU. - SEE COMMENT. 2. Lymph  node, needle/core biopsy, right axilla - POSITIVE FOR METASTATIC DUCTAL CARCINOMA. - SEE COMMENT. Microscopic Comment 1. Although definitive grading of breast carcinoma is best done on excision, the features of the invasive tumor from the right mass are compatible with a grade I-II breast carcinoma. Breast prognostic markers will be performed and reported in an addendum. Findings are called to the [facility] on 10/10/12. Dr. Colonel Bald has seen this case in consultation with agreement. 2. Sections from the right axillary lymph node show  metastatic ductal carcinoma. A breast prognostic profile will be performed and reported in an addendum. The findings are called to the [facility] on 10/10/2012. Dr. Colonel Bald has seen the second lymph node biopsy from the right axilla in consultation with agreement. (RH:/kh 10/10/12) Zandra Abts MD Pathologist, Electronic Signature (Case signed 10/10/2012) Specimen Gross and Clinical Information Specimen Comment 1. In formalin 2:40, extracted <1 min 2. Enlarged right axillary LN In formalin 2:40, extracted <1 min Specimen(s) Obtained: 1. Breast, right, needle core biopsy, mass 2. Lymph node, needle/core biopsy, right axilla Specimen Clinical  ASSESSMENT    57 year old female with  #1 known BRCA2 mutation carrier. I had seen patient quite some time back at that time she was recommended prophylactic mastectomies and oophorectomies but she declined at that time and only wanted surveillance. Unfortunately she was also lost a surveillance. She has not had a MRIs in quite some time.  #2 patient now unfortunately with 2 breast cancers. She apparently was diagnosed with a breast cancer in [place] back in 2000 of the left breast she had a lumpectomy and then pursue alternative therapies with blue crystal. Patient now with a mammogram finding of a 2.4 cm mass with the MRI confirming a 2.7 cm area. She also has enlarged  axillary lymph node. Biopsy shows a invasive ductal carcinoma with DCIS in the breast and a metastatic carcinoma in the lymph node. The tumor is ER positive PR positive HER-2/neu negative with an elevated Ki-67 of 89%. Patient has been seen by Dr. Claud Kelp. He had an extensive discussion with the patient regarding right modified radical mastectomy as well as contralateral prophylactic mastectomy in light of her BRCA2 gene mutation. Patient was also advised that she may have at some point a bilateral salpingo-oophorectomy as well. Patient at that time was uncertain. She wants to have her breasts conserved. Therefore she was referred to medical oncology.  Clinical Trial Eligibility: no Multidisciplinary conference discussion yes     PLAN:    #1 patient and I discussed extensively her BRCA2 gene mutation status we discussed her risk of developing subsequent breast cancers as well as ovarian malignancy. Patient is very well aware. She takes everything in stride.  #2 we discussed treatment for the right breast cancer which would include modified radical mastectomy. Versus giving her neoadjuvant chemotherapy to try to shrink the tumor to see if her breast is salvageable. Patient does have a second area which I do think would need to be biopsied and I have recommended that she keep the appointment that has been scheduled for her by radiology on Friday. Patient initially felt that she did not need the biopsy but eventually agreed to keep that biopsy appointment. In the meantime we did discuss her approaches including neoadjuvant. I discussed the type of chemotherapy would be used. She understands it would essentially be to shrink the tumor so that hopefully Dr. Derrell Lolling can do breast conserving surgery. We discussed the type of neoadjuvant chemotherapy which would include FEC to be given every 2 weeks for a total of 6 cycles with Neulasta support. This would then be followed by Taxol weekly for 12 weeks. We  discussed role of radiation therapy in post lumpectomy and node positive postmastectomy situation. We also discussed use of antiestrogen therapy for 5-10 years. The types of therapies that would include tamoxifen to be utilized for 10 years or aromatase inhibitor such as Arimidex or Aromasin or letrozole for 5 years.  #3 we discussed logistics  of getting her to chemotherapy which would include getting a Port-A-Cath placed, getting an echocardiogram we also discussed staging studies including obtaining a PET/CT. Discussed that we could begin chemotherapy neoadjuvant lean 2-3 weeks.  #4 at the end of the discussion and patient stated that she wants to think about it she does have a followup set up with Dr. Derrell Lolling did discuss things over even more. And certainly she will let me know what her eventual decision is. Patient also discussed the possibility of are going back to [place] for doing blue crystal treatment began as she had done for her left breast. She did show me the scars that she had obtained from that therapy on the left side.       Discussion: Patient is being treated per NCCN breast cancer care guidelines appropriate for stage.II   Thank you so much for allowing me to participate in the care of [facility]. I will continue to follow up the patient with you and assist in her care.  All questions were answered. The patient knows to call the clinic with any problems, questions or concerns. We can certainly see the patient much sooner if necessary.  I spent 60 minutes counseling the patient face to face. The total time spent in the appointment was 60 minutes.  Drue Second, MD Medical/Oncology [facility] [phone] (beeper) [phone] (Office)  10/23/2012, 4:26 PM

## 2012-10-23 NOTE — Progress Notes (Signed)
The patient has no POA/Living Will. I checked in new patient. She had her [facility] form. No financial issues.

## 2012-10-27 ENCOUNTER — Ambulatory Visit
Admission: RE | Admit: 2012-10-27 | Discharge: 2012-10-27 | Disposition: A | Discharge status: Home / Self Care | Payer: 59 | Source: Ambulatory Visit | Attending: General Surgery | Admitting: General Surgery

## 2012-10-27 DIAGNOSIS — R928 Other abnormal and inconclusive findings on diagnostic imaging of breast: Secondary | ICD-10-CM

## 2012-10-27 MED ORDER — GADOBENATE DIMEGLUMINE 529 MG/ML IV SOLN
14.0000 mL | Freq: Once | INTRAVENOUS | Status: AC | PRN
  Administered 2012-10-27: 14 mL via INTRAVENOUS

## 2012-11-01 ENCOUNTER — Encounter (INDEPENDENT_AMBULATORY_CARE_PROVIDER_SITE_OTHER): Payer: Self-pay | Admitting: General Surgery

## 2012-11-01 ENCOUNTER — Ambulatory Visit (INDEPENDENT_AMBULATORY_CARE_PROVIDER_SITE_OTHER): Payer: Commercial Managed Care - PPO | Admitting: General Surgery

## 2012-11-01 VITALS — BP 122/70 | HR 80 | Temp 99.2°F | Resp 16 | Ht 66.0 in | Wt 156.4 lb

## 2012-11-01 DIAGNOSIS — C50111 Malignant neoplasm of central portion of right female breast: Secondary | ICD-10-CM

## 2012-11-01 DIAGNOSIS — C50119 Malignant neoplasm of central portion of unspecified female breast: Secondary | ICD-10-CM

## 2012-11-01 NOTE — Progress Notes (Signed)
Patient ID: [name], female   DOB: 07/28/1955, 57 y.o.   MRN: [id number]  Chief Complaint  Patient presents with  . Follow-up    3 wk f/u with breast mri/onc    HPI [name] is a 57 y.o. female.  She returns for further discussion regarding management of her locally advanced right breast cancer.  Imaging studies show a 2.4 cm mass in the right breast at the 12:00 position, subareolar area. The right axillary lymph nodes or abnormal appearing. Both of the breast biopsy and the right axilla this has been biopsied and they showed invasive ductal carcinoma and DCIS, receptor positive, HER-2/neu negative. A second biopsy in the right breast was benign and felt to be concordant.  She has seen Dr. Drue Second. She is reluctant to undergo chemotherapy and it has asked if she can  to proceed with lumpectomy and axillary lymph node dissection. I have told her that it is my opinion that her tumor is too large and too central and this would leave an unacceptable cosmetic deformity.We have discussed her case in  breast conference this morning and I have discussed her case with Dr. Welton Flakes twice today.   At the end of a very lengthy discussion today, she has elected to undergo a right modified radical mastectomy. I think this is reasonable considering her BRCA2 carrier status. I offered to place a Port-A-Cath but she is unwilling to consent to chemotherapy at this time. I told her that we would put the Port-A-Cath in later if she changes her mind. I discussed this with Dr. Welton Flakes on the phone today. HPI  Past Medical History  Diagnosis Date  . Ovarian cyst   . Breast calcification, left   . Breast cancer     Past Surgical History  Procedure Laterality Date  . Abdominal hysterectomy    . Breast surgery      Lt breast calcification    Family History  Problem Relation Age of Onset  . Cancer Mother     ovarian  . Cancer Brother     bone    Social History History  Substance Use Topics  .  Smoking status: Never Smoker   . Smokeless tobacco: Never Used  . Alcohol Use: No    Allergies  Allergen Reactions  . Penicillins     REACTION: rash    Current Outpatient Prescriptions  Medication Sig Dispense Refill  . Multiple Vitamins-Minerals (MULTIVITAMIN PO) Take by mouth daily.       No current facility-administered medications for this visit.    Review of Systems Review of Systems  Constitutional: Negative for fever, chills and unexpected weight change.  HENT: Negative for hearing loss, congestion, sore throat, trouble swallowing and voice change.   Eyes: Negative for visual disturbance.  Respiratory: Negative for cough and wheezing.   Cardiovascular: Negative for chest pain, palpitations and leg swelling.  Gastrointestinal: Negative for nausea, vomiting, abdominal pain, diarrhea, constipation, blood in stool, abdominal distention and anal bleeding.  Genitourinary: Negative for hematuria, vaginal bleeding and difficulty urinating.  Musculoskeletal: Negative for arthralgias.  Skin: Negative for rash and wound.  Neurological: Negative for seizures, syncope and headaches.  Hematological: Negative for adenopathy. Does not bruise/bleed easily.  Psychiatric/Behavioral: Negative for confusion.    Blood pressure 122/70, pulse 80, temperature 99.2 F (37.3 C), temperature source Temporal, resp. rate 16, height 5\' 6"  (1.676 m), weight 156 lb 6.4 oz (70.943 kg).  Physical Exam Physical Exam  Constitutional: She is oriented to person, place,  and time. She appears well-developed and well-nourished. No distress.  HENT:  Head: Normocephalic and atraumatic.  Nose: Nose normal.  Mouth/Throat: No oropharyngeal exudate.  Eyes: Conjunctivae and EOM are normal. Pupils are equal, round, and reactive to light. Left eye exhibits no discharge. No scleral icterus.  Neck: Neck supple. No JVD present. No tracheal deviation present. No thyromegaly present.  Cardiovascular: Normal rate,  regular rhythm, normal heart sounds and intact distal pulses.   No murmur heard. Pulmonary/Chest: Effort normal and breath sounds normal. No respiratory distress. She has no wheezes. She has no rales. She exhibits no tenderness.  Circumareolar scar superiorly right breast. 2.5 or greater palpable mass in the upper immediate retroareolar area. This is quite large considering the total volume of the breast. Radially oriented scar left breast at 3:00 no palpable mass on the left. No adenopathy.  Abdominal: Soft. Bowel sounds are normal. She exhibits no distension and no mass. There is no tenderness. There is no rebound and no guarding.  Musculoskeletal: She exhibits no edema and no tenderness.  Lymphadenopathy:    She has no cervical adenopathy.  Neurological: She is alert and oriented to person, place, and time. She exhibits normal muscle tone. Coordination normal.  Skin: Skin is warm. No rash noted. She is not diaphoretic. No erythema. No pallor.  Psychiatric: She has a normal mood and affect. Her behavior is normal. Judgment and thought content normal.    Data Reviewed Case discussion in breast conference this morning. Discussion with Dr. Drue Second. All imaging studies. All biopsy pathology reports.  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 Immediate subareolar. Receptor positive. HER-2 negative. Clinical stage T2, N1.  At this point in time, she is an unacceptable candidate for lumpectomy due to the volume relationship of the tumor and breast.  Family history of ovarian cancer  BRCA2 positive status  Remote history left partial mastectomy, Saturday early cancer, alternative therapies were involved  Remote history right breast biopsy for benign disease     Plan    An extremely long discussion today about options and expectations. We talked about lumpectomy and axillary dissection at this time with unacceptable cosmetic  deformity, neoadjuvant chemotherapy, Port-A-Cath placement, right modified radical mastectomy with delayed reconstruction. We talked about referral to plastic surgery which we she was not interested in.  At the end of the discussion she agrees to proceed with scheduling of a right modified radical mastectomy. She does not consent to chemotherapy or Port-A-Cath so we are going to hold off on that.  I offered to refer her to a plastic surgeon and she said she will think about that, maybe later  I discussed the indications, details, techniques, and numerous risk mastectomy with her, including but not limited to bleeding, infection, skin necrosis, arm swelling, arm numbness, another run proceed promptly she understands these issues. Her questions were answered. She agrees with this plan.        Angelia Mould. Derrell Lolling, M.D., [facility] General and Minimally invasive Surgery Breast and Colorectal Surgery Office:   [phone] Pager:   [phone]  11/01/2012, 4:47 PM

## 2012-11-01 NOTE — Patient Instructions (Signed)
The second biopsy of your right breast is fortunately benign.  The tumor in the central portion of her breast is large, and performing a lumpectomy at this time would leave AN an acceptable cosmetic deformity.  We have talked about different options. You have stated that you are reluctant to have chemotherapy. You have decided to proceed with right modified radical mastectomy.       Total or Modified Radical Mastectomy  Care After Refer to this sheet in the next few weeks. These instructions provide you with information on caring for yourself after your procedure. Your caregiver may also give you more specific instructions. Your treatment has been planned according to current medical practices, but problems sometimes occur. Call your caregiver if you have any problems or questions after your procedure. ACTIVITY  Your caregiver will advise you when you may resume strenuous activities, driving, and sports.  After the drain(s) are removed, you may do light housework. Avoid heavy lifting, carrying, or pushing. You should not be lifting anything heavier than 5 lbs.  Take frequent rest periods. You may tire more easily than usual.  Always rest and elevate the arm affected by your surgery for a period of time equal to your activity time.  Continue doing the exercises given to you by the physical therapist/occupational therapist even after full range of motion has returned. The amount of time this takes will vary from person to person.  After normal range of motion has returned, some stiffness and soreness may persist for 2-3 months. This is normal and will subside.  Begin sports or strenuous activities in moderation. This will give you a chance to rebuild your endurance. Continue to be cautious of heavy lifting or carrying (no more than 10 lbs.) with your affected arm.  You may return to work as recommended by your caregiver. NUTRITION  You may resume your normal diet.  Make sure you  drink plenty of fluids (6-8 glasses a day).  Eat a well-balanced diet. Including daily portions of food from government recommended food groups:  Grains.  Vegetables.  Fruits.  Milk.  Meat & beans.  Oils. Visit [employer] for more information HYGIENE  You may wash your hair.  If your incision (cut from surgery) is closed, you may shower or tub bathe, unless instructed otherwise by your doctor. FEVER  If you feel feverish or have shaking chills, take your temperature. If your temperature is 102 F (38.9 C) or above, call your caregiver. The fever may mean there is an infection.  If you call early, infection can be treated with antibiotics and hospitalization may be avoided. PAIN CONTROL  Mild discomfort may occur.  You may need to take an over-the-counter pain medication or a medication prescribed by your caregiver.  Call your caregiver if you experience increased pain. INCISION CARE  Check your incision daily for increased redness, drainage, swelling, or separation of skin.  Call your caregiver if any of the above are noted. ARM AND HAND CARE  If the lymph nodes under your arm were removed with a modified radical mastectomy, there may be a greater tendency for the arm to swell.  Try to avoid having blood pressures taken, blood drawn, or injections given in the affected arm. This is the arm on the same side as the surgery.  Use hand lotion to soften cuticles instead of cutting them to avoid cutting yourself.  Be careful when shaving your under arms. Use an electric shaver if possible. You may use a deodorant after  the incision has completely healed. Until then, clean under your arms with hydrogen peroxide.  Use reasonable precaution when cooking, sewing, and gardening to avoid burning or needle or thorn pricks.  Do not weigh your arm straight down with a package or your purse.  Follow the exercises and instructions given to you by the physical  therapist/occupational therapist and your caregiver. FOLLOW-UP APPOINTMENT Call your caregiver for a follow-up appointment as directed. PROSTHESIS INFORMATION Wear your temporary prosthesis (artificial breast) until your caregiver gives you permission to purchase a permanent one. This will depend upon your rate of healing. We suggest you also wait until you are physically and emotionally ready to shop for one. The suitability depends on several individual factors. We do not endorse any particular prosthesis, but suggest you try several until you are satisfied with appearance and fit. A list of stores may be obtained from your local [employer] at www.cancer.org or 1-800-ACS-2345 (1-479-431-8588). A permanent prosthesis is medically necessary to restore balance. It is also income tax deductible. Be sure all receipts are marked "surgical". It is not essential to purchase a bra. You may sew a pocket into your regular bra. Note: Remember to take all of your medical insurance information with you when shopping for your prosthesis. SELECTING A PROSTHESIS FITTER You may want to ask the following questions when selecting a fitter:  What styles and brands of forms are carried in stock?  How long have the forms been on the market and have there been any problems with them?  Why would one form be better than another?  How long should a particular form last?  May I wear the form for a trial period without obligation?  Do the forms require a prosthetic bra? If so, what is the price range? Must I always wear that style?  If alterations to the bra are necessary, can they be done at this location or be sent out?  Will I be charged for alterations?  Will I receive suggestions on how to alter my own wardrobe, if necessary?  Will you special order forms or bras if necessary?  Are fitters always available to meet my needs?  What kinds of garments should be worn for the fitting?  Are lounge  wear, swim wear, and accessories available?  If I have insurance coverage or Medicare, will you suggest ways for processing the paper work?  Do you keep complete records so that mail reordering is possible?  How are warranty claims handled if I have a problem with the form? Document Released: 12/25/2003 Document Revised: 07/26/2011 Document Reviewed: 08/29/2007 [facility] Patient Information 2014 [place], [place].       Mastectomy, With or Without Reconstruction Mastectomy (removal of the breast) is a procedure most commonly used to treat cancer (tumor) of the breast. Different procedures are available for treatment. This depends on the stage of the tumor (abnormal growths). Discuss this with your caregiver, surgeon (a specialist for performing operations such as this), or oncologist (someone specialized in the treatment of cancer). With proper information, you can decide which treatment is best for you. Although the sound of the word cancer is frightening to all of [place], the new treatments and medications can be a source of reassurance and comfort. If there are things you are worried about, discuss them with your caregiver. He or she can help comfort you and your family. Some of the different procedures for treating breast cancer are:  Radical (extensive) mastectomy. This is an operation used to  remove the entire breast, the muscles under the breast, and all of the glands (lymph nodes) under the arm. With all of the new treatments available for cancer of the breast, this procedure has become less common.  Modified radical mastectomy. This is a similar operation to the radical mastectomy described above. In the modified radical mastectomy, the muscles of the chest wall are not removed unless one of the lessor muscles is removed. One of the lessor muscles may be removed to allow better removal of the lymph nodes. The axillary lymph nodes are also removed. Rarely, during an axillary node dissection  nerves to this area are damaged. Radiation therapy is then often used to the area following this surgery.  A total mastectomy also known as a complete or simple mastectomy. It involves removal of only the breast. The lymph nodes and the muscles are left in place.  In a lumpectomy, the lump is removed from the breast. This is the simplest form of surgical treatment. A sentinel lymph node biopsy may also be done. Additional treatment may be required. RISKS AND COMPLICATIONS The main problems that follow removal of the breast include:  Infection (germs start growing in the wound). This can usually be treated with antibiotics (medications that kill germs).  Lymphedema. This means the arm on the side of the breast that was operated on swells because the lymph (tissue fluid) cannot follow the main channels back into the body. This only occurs when the lymph nodes have had to be removed under the arm.  There may be some areas of numbness to the upper arm and around the incision (cut by the surgeon) in the breast. This happens because of the cutting of or damage to some of the nerves in the area. This is most often unavoidable.  There may be difficulty moving the arm in a full range of motion (moving in all directions) following surgery. This usually improves with time following use and exercise.  Recurrence of breast cancer may happen with the very best of surgery and follow up treatment. Sometimes small cancer cells that cannot be seen with the naked eye have already spread at the time of surgery. When this happens other treatment is available. This treatment may be radiation, medications or a combination of both. RECONSTRUCTION Reconstruction of the breast may be done immediately if there is not going to be post-operative radiation. This surgery is done for cosmetic (improve appearance) purposes to improve the physical appearance after the operation. This may be done in two ways:  It can be done using  a saline filled prosthetic (an artificial breast which is filled with salt water). Silicone breast implants are now re-approved by the FDA and are being commonly used.  Reconstruction can be done using the body's own muscle/fat/skin. Your caregiver will discuss your options with you. Depending upon your needs or choice, together you will be able to determine which procedure is best for you. Document Released: 01/26/2001 Document Revised: 01/26/2012 Document Reviewed: 09/19/2007 [facility] Patient Information 2014 [place], [place].

## 2012-11-03 NOTE — Pre-Procedure Instructions (Signed)
Thena Devora  11/03/2012   Your procedure is scheduled on: Tuesday, June  24th   Report to [facility] Stay [facility] at 9:00 AM.             (Come through Entrance "A", follow signs to [facility] and go to 3rd floor)   Call this number if you have problems the morning of surgery: [phone]   Remember:   Do not eat food or drink liquids after midnight Monday.   Take these medicines the morning of surgery with A SIP OF WATER:  Nothing   Do not wear jewelry, make-up or nail polish.  Do not wear lotions, powders, or perfumes. You may NOT  wear deodorant.   Do not shave underarms & legs 48 hours prior to surgery.   Do not bring valuables to the hospital.  [facility] is not responsible for any belongings or valuables.   Contacts, dentures or bridgework may not be worn into surgery.  Leave suitcase in the car. After surgery it may be brought to your room.  For patients admitted to the hospital, checkout time is 11:00 AM the day of discharge.   Patients discharged the day of surgery will not be allowed to drive home, and will need someone to stay with             You for 24 hrs afterwards.   Name and phone number of your driver:    Special Instructions: Shower using CHG 2 nights before surgery and the night before surgery.  If you shower the day of surgery use CHG.  Use special wash - you have one bottle of CHG for all showers.  You should use approximately 1/3 of the bottle for each shower.   Please read over the following fact sheets that you were given: Pain Booklet, Coughing and Deep Breathing, MRSA Information and Surgical Site Infection Prevention

## 2012-11-06 ENCOUNTER — Encounter (HOSPITAL_COMMUNITY): Payer: Self-pay

## 2012-11-06 ENCOUNTER — Encounter (HOSPITAL_COMMUNITY)
Admission: RE | Admit: 2012-11-06 | Discharge: 2012-11-06 | Disposition: A | Discharge status: Home / Self Care | Payer: 59 | Source: Ambulatory Visit | Attending: General Surgery | Admitting: General Surgery

## 2012-11-06 HISTORY — DX: Diverticulitis of intestine, part unspecified, without perforation or abscess without bleeding: K57.92

## 2012-11-06 LAB — URINE MICROSCOPIC-ADD ON

## 2012-11-06 LAB — COMPREHENSIVE METABOLIC PANEL
ALT: 14 U/L (ref 0–35)
AST: 15 U/L (ref 0–37)
Alkaline Phosphatase: 96 U/L (ref 39–117)
CO2: 29 mEq/L (ref 19–32)
Calcium: 9.6 mg/dL (ref 8.4–10.5)
Chloride: 102 mEq/L (ref 96–112)
GFR calc non Af Amer: 76 mL/min — ABNORMAL LOW (ref >90)
Potassium: 3.6 mEq/L (ref 3.5–5.1)
Sodium: 139 mEq/L (ref 135–145)

## 2012-11-06 LAB — CBC WITH DIFFERENTIAL/PLATELET
Basophils Absolute: 0 10*3/uL (ref 0.0–0.1)
Eosinophils Relative: 3 % (ref 0–5)
Lymphocytes Relative: 45 % (ref 12–46)
Lymphs Abs: 2.6 10*3/uL (ref 0.7–4.0)
Neutro Abs: 2.5 10*3/uL (ref 1.7–7.7)
Neutrophils Relative %: 43 % (ref 43–77)
Platelets: 284 10*3/uL (ref 150–400)
RBC: 4.78 MIL/uL (ref 3.87–5.11)
RDW: 13 % (ref 11.5–15.5)
WBC: 5.8 10*3/uL (ref 4.0–10.5)

## 2012-11-06 LAB — URINALYSIS, ROUTINE W REFLEX MICROSCOPIC
Bilirubin Urine: NEGATIVE
Ketones, ur: NEGATIVE mg/dL
Specific Gravity, Urine: 1.024 (ref 1.005–1.030)
Urobilinogen, UA: 0.2 mg/dL (ref 0.0–1.0)

## 2012-11-06 LAB — NO BLOOD PRODUCTS

## 2012-11-06 LAB — SURGICAL PCR SCREEN: Staphylococcus aureus: NEGATIVE

## 2012-11-06 MED ORDER — VANCOMYCIN HCL IN DEXTROSE 1-5 GM/200ML-% IV SOLN
1000.0000 mg | INTRAVENOUS | Status: AC
  Administered 2012-11-07: 1000 mg via INTRAVENOUS
  Filled 2012-11-06: qty 200

## 2012-11-06 NOTE — H&P (Signed)
[name]   MRN:  [id number]   Description: 57 year old female  Provider: Ernestene Mention, MD  Department: [facility]        Diagnoses    Cancer of central portion of female breast, right    -  Primary    174.1           Current Vitals - Last Recorded    BP Pulse Temp(Src) Resp Ht Wt    122/70 80 99.2 F (37.3 C) (Temporal) 16 5\' 6"  (1.676 m) 156 lb 6.4 oz (70.943 kg)    BMI - 25.26 kg/m2                    History and Physical   Ernestene Mention, MD    Status: Signed                             HPI [name] is a 57 y.o. female.  She returns for further discussion regarding management of her locally advanced right breast cancer.   Imaging studies show a 2.4 cm mass in the right breast at the 12:00 position, subareolar area. The right axillary lymph nodes or abnormal appearing. Both of the breast biopsy and the right axilla this has been biopsied and they showed invasive ductal carcinoma and DCIS, receptor positive, HER-2/neu negative. A second biopsy in the right breast was benign and felt to be concordant.   She has seen Dr. Drue Second. She is reluctant to undergo chemotherapy and it has asked if she can  to proceed with lumpectomy and axillary lymph node dissection. I have told her that we could do that but it is my opinion that her tumor is too large and too central and this would leave a very unacceptable cosmetic deformity.We have discussed her case in  breast conference this morning and I have discussed her case with Dr. Welton Flakes twice today.    At the end of a very lengthy discussion today, she has elected to undergo a right modified radical mastectomy. I think this is reasonable considering her BRCA2 carrier status. I offered to place a Port-A-Cath but she is unwilling to consent to chemotherapy at this time. I told her that we would put the Port-A-Cath in later if she changes her mind. I discussed this with Dr. Welton Flakes on the phone  today.       Past Medical History   Diagnosis  Date   .  Ovarian cyst     .  Breast calcification, left     .  Breast cancer           Past Surgical History   Procedure  Laterality  Date   .  Abdominal hysterectomy       .  Breast surgery           Lt breast calcification         Family History   Problem  Relation  Age of Onset   .  Cancer  Mother         ovarian   .  Cancer  Brother         bone        Social History History   Substance Use Topics   .  Smoking status:  Never Smoker    .  Smokeless tobacco:  Never Used   .  Alcohol Use:  No  Allergies   Allergen  Reactions   .  Penicillins         REACTION: rash         Current Outpatient Prescriptions   Medication  Sig  Dispense  Refill   .  Multiple Vitamins-Minerals (MULTIVITAMIN PO)  Take by mouth daily.             No current facility-administered medications for this visit.        Review of Systems   Constitutional: Negative for fever, chills and unexpected weight change.  HENT: Negative for hearing loss, congestion, sore throat, trouble swallowing and voice change.   Eyes: Negative for visual disturbance.  Respiratory: Negative for cough and wheezing.   Cardiovascular: Negative for chest pain, palpitations and leg swelling.  Gastrointestinal: Negative for nausea, vomiting, abdominal pain, diarrhea, constipation, blood in stool, abdominal distention and anal bleeding.  Genitourinary: Negative for hematuria, vaginal bleeding and difficulty urinating.  Musculoskeletal: Negative for arthralgias.  Skin: Negative for rash and wound.  Neurological: Negative for seizures, syncope and headaches.  Hematological: Negative for adenopathy. Does not bruise/bleed easily.  Psychiatric/Behavioral: Negative for confusion.      Blood pressure 122/70, pulse 80, temperature 99.2 F (37.3 C), temperature source Temporal, resp. rate 16, height 5\' 6"  (1.676 m), weight 156 lb 6.4 oz (70.943  kg).   Physical Exam   Constitutional: She is oriented to person, place, and time. She appears well-developed and well-nourished. No distress.  HENT:   Head: Normocephalic and atraumatic.   Nose: Nose normal.   Mouth/Throat: No oropharyngeal exudate.  Eyes: Conjunctivae and EOM are normal. Pupils are equal, round, and reactive to light. Left eye exhibits no discharge. No scleral icterus.  Neck: Neck supple. No JVD present. No tracheal deviation present. No thyromegaly present.  Cardiovascular: Normal rate, regular rhythm, normal heart sounds and intact distal pulses.    No murmur heard. Pulmonary/Chest: Effort normal and breath sounds normal. No respiratory distress. She has no wheezes. She has no rales. She exhibits no tenderness.  Circumareolar scar superiorly right breast. 2.5 or greater palpable mass in the upper immediate retroareolar area. This is quite large considering the total volume of the breast. Radially oriented scar left breast at 3:00 no palpable mass on the left. No adenopathy.  Abdominal: Soft. Bowel sounds are normal. She exhibits no distension and no mass. There is no tenderness. There is no rebound and no guarding.  Musculoskeletal: She exhibits no edema and no tenderness.  Lymphadenopathy:    She has no cervical adenopathy.  Neurological: She is alert and oriented to person, place, and time. She exhibits normal muscle tone. Coordination normal.  Skin: Skin is warm. No rash noted. She is not diaphoretic. No erythema. No pallor.  Psychiatric: She has a normal mood and affect. Her behavior is normal. Judgment and thought content normal.      Data Reviewed Case discussion in breast conference this morning. Discussion with Dr. Drue Second. All imaging studies. All biopsy pathology reports.   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 Immediate subareolar. Receptor positive. HER-2 negative. Clinical stage T2,  N1.   At this point in time, she is an unacceptable candidate for lumpectomy due to the volume relationship of the tumor and breast.   Family history of ovarian cancer   BRCA2 positive status   Remote history left partial mastectomy, Saturday early cancer, alternative therapies were involved   Remote history right breast biopsy for benign  disease      Plan    An extremely long, 45 minute discussion today about options and expectations. We talked about lumpectomy and axillary dissection at this time with unacceptable cosmetic deformity, neoadjuvant chemotherapy, Port-A-Cath placement, right modified radical mastectomy with delayed reconstruction. We talked about referral to plastic surgery which we she was not interested in.   At the end of the discussion she agrees to proceed with scheduling of a right modified radical mastectomy. She does not consent to chemotherapy or Port-A-Cath so we are going to hold off on that.   I offered to refer her to a plastic surgeon and she said she will think about that, maybe later   I discussed the indications, details, techniques, and numerous risk mastectomy with her, including but not limited to bleeding, infection, skin necrosis, arm swelling, arm numbness, another run proceed promptly she understands these issues. Her questions were answered. She agrees with this plan.           Angelia Mould. Derrell Lolling, M.D., [facility] General and Minimally invasive Surgery Breast and Colorectal Surgery Office:   [phone] Pager:   [phone]

## 2012-11-07 ENCOUNTER — Ambulatory Visit (HOSPITAL_COMMUNITY): Payer: 59 | Admitting: Anesthesiology

## 2012-11-07 ENCOUNTER — Encounter (HOSPITAL_COMMUNITY): Payer: Self-pay | Admitting: Anesthesiology

## 2012-11-07 ENCOUNTER — Encounter (HOSPITAL_COMMUNITY)
Admission: RE | Disposition: A | Discharge status: Home / Self Care | Payer: Self-pay | Source: Ambulatory Visit | Attending: General Surgery

## 2012-11-07 ENCOUNTER — Other Ambulatory Visit (INDEPENDENT_AMBULATORY_CARE_PROVIDER_SITE_OTHER): Payer: Self-pay | Admitting: General Surgery

## 2012-11-07 ENCOUNTER — Observation Stay (HOSPITAL_COMMUNITY)
Admission: RE | Admit: 2012-11-07 | Discharge: 2012-11-08 | Disposition: A | Discharge status: Home / Self Care | Payer: 59 | Source: Ambulatory Visit | Attending: General Surgery | Admitting: General Surgery

## 2012-11-07 DIAGNOSIS — C50119 Malignant neoplasm of central portion of unspecified female breast: Principal | ICD-10-CM | POA: Insufficient documentation

## 2012-11-07 DIAGNOSIS — C50111 Malignant neoplasm of central portion of right female breast: Secondary | ICD-10-CM | POA: Diagnosis present

## 2012-11-07 DIAGNOSIS — D0590 Unspecified type of carcinoma in situ of unspecified breast: Secondary | ICD-10-CM

## 2012-11-07 HISTORY — PX: MASTECTOMY MODIFIED RADICAL: SUR848

## 2012-11-07 HISTORY — PX: MASTECTOMY MODIFIED RADICAL: SHX5962

## 2012-11-07 SURGERY — MASTECTOMY, MODIFIED RADICAL
Anesthesia: General | Site: Breast | Laterality: Right | Wound class: Clean

## 2012-11-07 MED ORDER — EPHEDRINE SULFATE 50 MG/ML IJ SOLN
INTRAMUSCULAR | Status: DC | PRN
  Administered 2012-11-07 (×2): 10 mg via INTRAVENOUS

## 2012-11-07 MED ORDER — FENTANYL CITRATE 0.05 MG/ML IJ SOLN
INTRAMUSCULAR | Status: DC | PRN
  Administered 2012-11-07: 100 ug via INTRAVENOUS
  Administered 2012-11-07: 25 ug via INTRAVENOUS
  Administered 2012-11-07: 75 ug via INTRAVENOUS
  Administered 2012-11-07: 50 ug via INTRAVENOUS

## 2012-11-07 MED ORDER — MIDAZOLAM HCL 5 MG/5ML IJ SOLN
INTRAMUSCULAR | Status: DC | PRN
  Administered 2012-11-07: 2 mg via INTRAVENOUS

## 2012-11-07 MED ORDER — LIDOCAINE HCL (CARDIAC) 20 MG/ML IV SOLN
INTRAVENOUS | Status: DC | PRN
  Administered 2012-11-07: 60 mg via INTRAVENOUS

## 2012-11-07 MED ORDER — FENTANYL CITRATE 0.05 MG/ML IJ SOLN
25.0000 ug | INTRAMUSCULAR | Status: DC | PRN
  Administered 2012-11-07: 100 ug via INTRAVENOUS

## 2012-11-07 MED ORDER — POTASSIUM CHLORIDE IN NACL 20-0.9 MEQ/L-% IV SOLN
INTRAVENOUS | Status: DC
  Administered 2012-11-07: 15:00:00 via INTRAVENOUS
  Administered 2012-11-08: 100 mL/h via INTRAVENOUS
  Filled 2012-11-07 (×4): qty 1000

## 2012-11-07 MED ORDER — HYDROMORPHONE HCL PF 1 MG/ML IJ SOLN: 0.5000 mg | INTRAMUSCULAR | Status: DC | PRN

## 2012-11-07 MED ORDER — FENTANYL CITRATE 0.05 MG/ML IJ SOLN
INTRAMUSCULAR | Status: AC
  Filled 2012-11-07: qty 2

## 2012-11-07 MED ORDER — OXYCODONE HCL 5 MG/5ML PO SOLN: 5.0000 mg | Freq: Once | ORAL | Status: AC | PRN

## 2012-11-07 MED ORDER — ONDANSETRON HCL 4 MG/2ML IJ SOLN: 4.0000 mg | Freq: Four times a day (QID) | INTRAMUSCULAR | Status: DC | PRN

## 2012-11-07 MED ORDER — HYDROMORPHONE HCL PF 1 MG/ML IJ SOLN
0.2500 mg | INTRAMUSCULAR | Status: DC | PRN
  Administered 2012-11-07 (×2): 0.5 mg via INTRAVENOUS

## 2012-11-07 MED ORDER — 0.9 % SODIUM CHLORIDE (POUR BTL) OPTIME
TOPICAL | Status: DC | PRN
  Administered 2012-11-07 (×2): 1000 mL

## 2012-11-07 MED ORDER — OXYCODONE HCL 5 MG PO TABS
5.0000 mg | ORAL_TABLET | Freq: Once | ORAL | Status: AC | PRN
  Administered 2012-11-07: 5 mg via ORAL

## 2012-11-07 MED ORDER — CHLORHEXIDINE GLUCONATE 4 % EX LIQD: 1.0000 "application " | Freq: Once | CUTANEOUS | Status: DC

## 2012-11-07 MED ORDER — ONDANSETRON HCL 4 MG PO TABS: 4.0000 mg | ORAL_TABLET | Freq: Four times a day (QID) | ORAL | Status: DC | PRN

## 2012-11-07 MED ORDER — OXYCODONE-ACETAMINOPHEN 5-325 MG PO TABS
1.0000 | ORAL_TABLET | ORAL | Status: DC | PRN
  Administered 2012-11-07 (×2): 2 via ORAL
  Administered 2012-11-08: 1 via ORAL
  Filled 2012-11-07 (×2): qty 2
  Filled 2012-11-07: qty 1

## 2012-11-07 MED ORDER — OXYCODONE HCL 5 MG PO TABS
ORAL_TABLET | ORAL | Status: AC
  Filled 2012-11-07: qty 1

## 2012-11-07 MED ORDER — LACTATED RINGERS IV SOLN
INTRAVENOUS | Status: DC
  Administered 2012-11-07: 10:00:00 via INTRAVENOUS

## 2012-11-07 MED ORDER — PROMETHAZINE HCL 25 MG/ML IJ SOLN: 6.2500 mg | INTRAMUSCULAR | Status: DC | PRN

## 2012-11-07 MED ORDER — ONDANSETRON HCL 4 MG/2ML IJ SOLN
INTRAMUSCULAR | Status: DC | PRN
  Administered 2012-11-07: 4 mg via INTRAVENOUS

## 2012-11-07 MED ORDER — HYDROMORPHONE HCL PF 1 MG/ML IJ SOLN
INTRAMUSCULAR | Status: AC
  Filled 2012-11-07: qty 1

## 2012-11-07 MED ORDER — PROPOFOL 10 MG/ML IV BOLUS
INTRAVENOUS | Status: DC | PRN
  Administered 2012-11-07: 150 mg via INTRAVENOUS

## 2012-11-07 MED ORDER — HEPARIN SODIUM (PORCINE) 5000 UNIT/ML IJ SOLN
5000.0000 [IU] | Freq: Three times a day (TID) | INTRAMUSCULAR | Status: DC
  Administered 2012-11-07: 5000 [IU] via SUBCUTANEOUS
  Filled 2012-11-07 (×4): qty 1

## 2012-11-07 SURGICAL SUPPLY — 54 items
ADH SKN CLS APL DERMABOND .7 (GAUZE/BANDAGES/DRESSINGS) ×1
APL SKNCLS STERI-STRIP NONHPOA (GAUZE/BANDAGES/DRESSINGS)
APPLIER CLIP 9.375 MED OPEN (MISCELLANEOUS) ×2
APR CLP MED 9.3 20 MLT OPN (MISCELLANEOUS) ×1
BENZOIN TINCTURE PRP APPL 2/3 (GAUZE/BANDAGES/DRESSINGS) ×1 IMPLANT
BINDER BREAST LRG (GAUZE/BANDAGES/DRESSINGS) ×1 IMPLANT
BINDER BREAST XLRG (GAUZE/BANDAGES/DRESSINGS) IMPLANT
CANISTER SUCTION 2500CC (MISCELLANEOUS) ×2 IMPLANT
CHLORAPREP W/TINT 26ML (MISCELLANEOUS) ×2 IMPLANT
CLIP APPLIE 9.375 MED OPEN (MISCELLANEOUS) ×1 IMPLANT
CLOTH BEACON ORANGE TIMEOUT ST (SAFETY) ×2 IMPLANT
CONT SPEC 4OZ CLIKSEAL STRL BL (MISCELLANEOUS) IMPLANT
COVER SURGICAL LIGHT HANDLE (MISCELLANEOUS) ×2 IMPLANT
DERMABOND ADVANCED (GAUZE/BANDAGES/DRESSINGS) ×1
DERMABOND ADVANCED .7 DNX12 (GAUZE/BANDAGES/DRESSINGS) ×1 IMPLANT
DRAIN CHANNEL 19F RND (DRAIN) ×3 IMPLANT
DRAPE LAPAROSCOPIC ABDOMINAL (DRAPES) ×2 IMPLANT
DRAPE UTILITY 15X26 W/TAPE STR (DRAPE) ×4 IMPLANT
DRSG ADAPTIC 3X8 NADH LF (GAUZE/BANDAGES/DRESSINGS) ×2 IMPLANT
DRSG PAD ABDOMINAL 8X10 ST (GAUZE/BANDAGES/DRESSINGS) ×2 IMPLANT
ELECT BLADE 4.0 EZ CLEAN MEGAD (MISCELLANEOUS) ×2
ELECT CAUTERY BLADE 6.4 (BLADE) ×2 IMPLANT
ELECT REM PT RETURN 9FT ADLT (ELECTROSURGICAL) ×2
ELECTRODE BLDE 4.0 EZ CLN MEGD (MISCELLANEOUS) ×1 IMPLANT
ELECTRODE REM PT RTRN 9FT ADLT (ELECTROSURGICAL) ×1 IMPLANT
EVACUATOR SILICONE 100CC (DRAIN) ×4 IMPLANT
GLOVE BIO SURGEON STRL SZ7.5 (GLOVE) ×2 IMPLANT
GLOVE BIOGEL PI IND STRL 7.0 (GLOVE) IMPLANT
GLOVE BIOGEL PI IND STRL 7.5 (GLOVE) IMPLANT
GLOVE BIOGEL PI IND STRL 8 (GLOVE) ×1 IMPLANT
GLOVE BIOGEL PI INDICATOR 7.0 (GLOVE) ×1
GLOVE BIOGEL PI INDICATOR 7.5 (GLOVE) ×1
GLOVE BIOGEL PI INDICATOR 8 (GLOVE) ×1
GLOVE ECLIPSE 8.0 STRL XLNG CF (GLOVE) ×1 IMPLANT
GLOVE EUDERMIC 7 POWDERFREE (GLOVE) ×2 IMPLANT
GLOVE SURG SS PI 7.0 STRL IVOR (GLOVE) ×1 IMPLANT
GOWN STRL NON-REIN LRG LVL3 (GOWN DISPOSABLE) ×5 IMPLANT
GOWN STRL REIN XL XLG (GOWN DISPOSABLE) ×2 IMPLANT
KIT BASIN OR (CUSTOM PROCEDURE TRAY) ×2 IMPLANT
KIT ROOM TURNOVER OR (KITS) ×2 IMPLANT
NS IRRIG 1000ML POUR BTL (IV SOLUTION) ×4 IMPLANT
PACK GENERAL/GYN (CUSTOM PROCEDURE TRAY) ×2 IMPLANT
PAD ARMBOARD 7.5X6 YLW CONV (MISCELLANEOUS) ×2 IMPLANT
SPECIMEN JAR X LARGE (MISCELLANEOUS) ×2 IMPLANT
SPONGE GAUZE 4X4 12PLY (GAUZE/BANDAGES/DRESSINGS) ×3 IMPLANT
SPONGE LAP 18X18 X RAY DECT (DISPOSABLE) ×1 IMPLANT
STAPLER VISISTAT 35W (STAPLE) IMPLANT
STRIP CLOSURE SKIN 1/2X4 (GAUZE/BANDAGES/DRESSINGS) ×1 IMPLANT
SUT ETHILON 3 0 FSL (SUTURE) ×4 IMPLANT
SUT MNCRL AB 4-0 PS2 18 (SUTURE) ×4 IMPLANT
SUT SILK 2 0 FS (SUTURE) ×2 IMPLANT
SUT VIC AB 3-0 SH 18 (SUTURE) ×3 IMPLANT
TOWEL OR 17X24 6PK STRL BLUE (TOWEL DISPOSABLE) ×1 IMPLANT
TOWEL OR 17X26 10 PK STRL BLUE (TOWEL DISPOSABLE) ×2 IMPLANT

## 2012-11-07 NOTE — Transfer of Care (Signed)
Immediate Anesthesia Transfer of Care Note  Patient: [name]  Procedure(s) Performed: Procedure(s): MASTECTOMY MODIFIED RADICAL (Right)  Patient Location: PACU  Anesthesia Type:General  Level of Consciousness: awake, alert  and oriented  Airway & Oxygen Therapy: Patient Spontanous Breathing and Patient connected to nasal cannula oxygen  Post-op Assessment: Report given to PACU RN, Post -op Vital signs reviewed and stable and Patient moving all extremities X 4  Post vital signs: Reviewed and stable  Complications: No apparent anesthesia complications

## 2012-11-07 NOTE — Preoperative (Signed)
Beta Blockers   Reason not to administer Beta Blockers:Not Applicable 

## 2012-11-07 NOTE — Op Note (Signed)
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 Immediate subareolar. Receptor positive. HER-2 negative. Clinical stage T2, N1.   Post op Diagnosis:    same  Procedure:                 Right modified radical mastectomy and  Surgeon:                     Angelia Mould. Derrell Lolling, M.D., FACS  Assistant:                      Avel Peace, M.D., FACS  Operative Indications:   [name] is a 57 y.o. Female. She has recently been evaluated in the office for a palpable mass in the central portion of the right breast. Imaging studies show a 2.4 cm mass in the right breast at the 12:00 position, subareolar area. The right axillary lymph nodes are abnormal appearing. Both  the breast biopsy and the right axilla  has been biopsied and they both showed invasive ductal carcinoma and DCIS, receptor positive, HER-2/neu negative. A second biopsy in the right breast was benign and felt to be concordant.  She has seen Dr. Drue Second. She is reluctant to undergo chemotherapy and it has asked if she can to proceed with lumpectomy and axillary lymph node dissection. I have told her that we could do that but it is my opinion that her tumor is too large and too central and this would leave a very unacceptable cosmetic deformity.We have discussed her case in breast conference  and I have discussed her case with Dr. Welton Flakes.  At the end of a very lengthy discussion last week, she has elected to undergo a right modified radical mastectomy. I think this is reasonable considering her BRCA2 carrier status. I offered to place a Port-A-Cath but she is unwilling to consent to chemotherapy at this time. I told her that we would put the Port-A-Cath in later if she changes her mind.  She was offered plastic surgical consultation, but declined stating that she would do that later.   Operative Findings:        She had slightly enlarged lymph nodes in the right axilla, and they felt  pathologic. I identified and preserved the thoracodorsal neurovascular bundle and the long thoracic nerve. I felt like we got widely around the central tumor.  Procedure in Detail:          Following the induction of general endotracheal anesthesia the patient's right chest wall, right upper extremity and neck were prepped and draped in sterile fashion. Intravenous antibiotics were given. Surgical time out was performed. Using a marking pen I marked the anatomic boundaries of the breast and mapped out a transverse elliptical incision which would stay away from the central palpable mass. Elliptical incision was made. Skin flaps were raised superiorly to the infraclavicular area, medially to the parasternal area, inferiorly to the anterior rectus sheath and laterally to latissimus dorsi muscle. The breast was dissected off of the pectoralis major and minor muscles with electrocautery. The lateral skin margin was marked with a silk suture. I incised the clavipectoral fascia and  retracted the pectoralis major and minor muscles medially. I carried the dissection up into the axilla until I identified  the axillary vein. I cleaned off the inferior border of the axillary vein. A couple of superficial tributaries were controlled with metal clips and divided. I pulled all the lymph nodes down below the axillary vein. I identified the thoracodorsal neurovascular bundle and preserved it. A superficial sensory nerve was clipped and  divided and sacrificed. I took essentially all the level I and level II lymph nodes and out and sent them attached to the breast as a single specimen. The thoracodorsal nerve and long thoracic nerve were functioning at the completion of the case. The wounds were irrigated copiously with 2 L of saline. Hemostasis was excellent. Two 19 French Blake drains were placed, one up into the axilla and one across the skin flaps.  These were brought through separate stab incisions inferolaterally and sutured to the skin with nylon sutures and connected to suction bulbs. The incision was closed in 2 layers, subcutaneous tissue with interrupted sutures of 3-0 Vicryl and skin with running 4-0 Monocryl and Dermabond. Breast binder was placed the patient taken to recovery in stable condition. EBL 100 cc or less. Counts correct. Complications none.     Angelia Mould. Derrell Lolling, M.D., FACS General and Minimally Invasive Surgery Breast and Colorectal Surgery  11/07/2012 12:40 PM

## 2012-11-07 NOTE — Anesthesia Procedure Notes (Addendum)
Procedure Name: LMA Insertion Date/Time: 11/07/2012 11:00 AM Performed by: Sharlene Dory E Pre-anesthesia Checklist: Patient identified, Emergency Drugs available, Suction available, Patient being monitored and Timeout performed Patient Re-evaluated:Patient Re-evaluated prior to inductionOxygen Delivery Method: Circle system utilized Preoxygenation: Pre-oxygenation with 100% oxygen Intubation Type: IV induction Ventilation: Mask ventilation without difficulty LMA: LMA inserted LMA Size: 4.0 Number of attempts: 1 Placement Confirmation: positive ETCO2 and breath sounds checked- equal and bilateral Tube secured with: Tape Dental Injury: Teeth and Oropharynx as per pre-operative assessment  Comments: LMA insterted per SRNA with MDA supervision.

## 2012-11-07 NOTE — Anesthesia Preprocedure Evaluation (Addendum)
Anesthesia Evaluation  Patient identified by MRN, date of birth, ID band Patient awake    Reviewed: Allergy & Precautions, H&P , NPO status , Patient's Chart, lab work & pertinent test results  Airway Mallampati: II  Neck ROM: Full    Dental  (+) Teeth Intact   Pulmonary    Pulmonary exam normal       Cardiovascular     Neuro/Psych Anxiety Depression    GI/Hepatic   Endo/Other    Renal/GU      Musculoskeletal   Abdominal   Peds  Hematology   Anesthesia Other Findings   Reproductive/Obstetrics                          Anesthesia Physical Anesthesia Plan  ASA: II  Anesthesia Plan: General   Post-op Pain Management:    Induction: Intravenous  Airway Management Planned: Oral ETT  Additional Equipment:   Intra-op Plan:   Post-operative Plan: Extubation in OR  Informed Consent: I have reviewed the patients History and Physical, chart, labs and discussed the procedure including the risks, benefits and alternatives for the proposed anesthesia with the patient or authorized representative who has indicated his/her understanding and acceptance.   Dental advisory given  Plan Discussed with: CRNA and Anesthesiologist  Anesthesia Plan Comments:         Anesthesia Quick Evaluation

## 2012-11-07 NOTE — OR Nursing (Signed)
Poor eye contact, speaks softly when answering questions.Seems more comfortable after IV Fentanyl

## 2012-11-07 NOTE — Anesthesia Postprocedure Evaluation (Signed)
Anesthesia Post Note  Patient: [name]  Procedure(s) Performed: Procedure(s) (LRB): MASTECTOMY MODIFIED RADICAL (Right)  Anesthesia type: general  Patient location: PACU  Post pain: Pain level controlled  Post assessment: Patient's Cardiovascular Status Stable  Last Vitals:  Filed Vitals:   11/07/12 1320  BP: 138/79  Pulse: 64  Temp:   Resp: 14    Post vital signs: Reviewed and stable  Level of consciousness: sedated  Complications: No apparent anesthesia complications

## 2012-11-08 ENCOUNTER — Encounter (HOSPITAL_COMMUNITY): Payer: Self-pay | Admitting: General Surgery

## 2012-11-08 LAB — BASIC METABOLIC PANEL
BUN: 8 mg/dL (ref 6–23)
Calcium: 9 mg/dL (ref 8.4–10.5)
Creatinine, Ser: 0.74 mg/dL (ref 0.50–1.10)
GFR calc non Af Amer: >90 mL/min (ref >90)
Glucose, Bld: 105 mg/dL — ABNORMAL HIGH (ref 70–99)

## 2012-11-08 LAB — CBC
MCH: 28.3 pg (ref 26.0–34.0)
MCHC: 33.2 g/dL (ref 30.0–36.0)
Platelets: 265 10*3/uL (ref 150–400)

## 2012-11-08 MED ORDER — HYDROCODONE-ACETAMINOPHEN 5-325 MG PO TABS: 1.0000 | ORAL_TABLET | ORAL | Status: DC | PRN

## 2012-11-08 NOTE — Discharge Instructions (Signed)
-  see above 

## 2012-11-08 NOTE — Progress Notes (Signed)
1 Day Post-Op  Subjective: Doing well. Comfortable. Wants to go home. Tolerating diet. Ambulating to the bathroom. Voiding uneventfully. Pain control good. No excessive drainage or bleeding. Both drains functioning.  Operative findings discussed with patient. Diet and wound care discussed with patient.  Objective: Vital signs in last 24 hours: Temp:  [97.1 F (36.2 C)-98.3 F (36.8 C)] 98.1 F (36.7 C) (06/25 0136) Pulse Rate:  [60-79] 70 (06/25 0136) Resp:  [9-25] 18 (06/25 0136) BP: (104-144)/(52-79) 104/59 mmHg (06/25 0136) SpO2:  [87 %-100 %] 96 % (06/25 0136) Last BM Date: 11/06/12  Intake/Output from previous day: 06/24 0701 - 06/25 0700 In: 1520 [P.O.:720; I.V.:800] Out: 146 [Emesis/NG output:1; Drains:95; Blood:50] Intake/Output this shift: Total I/O In: -  Out: 71 [Emesis/NG output:1; Drains:70]  General appearance: alert. Cooperative. No distress. Stoic. Breasts: normal appearance, no masses or tenderness, right mastectomy skin flaps pink and viable. No necrosis. No hematoma or seroma. Both drains functioning. No arm swelling or numbness.  Lab Results:  No results found for this or any previous visit (from the past 24 hour(s)).   Studies/Results: @RISRSLT24 @  . heparin  5,000 Units Subcutaneous Q8H     Assessment/Plan: s/p Procedure(s): MASTECTOMY MODIFIED RADICAL  POD #1. Doing well. Discharge home today. Prescription for Vicodin given. Diet and activities discussed. Wound and drain care discussed. Return to see me in one week. We'll call pathology report tomorrow or Friday, once completed.    @PROBHOSP @  LOS: 1 day    Jusiah Aguayo M 11/08/2012  . .prob

## 2012-11-08 NOTE — Discharge Summary (Signed)
  Patient ID: [name] [id number] 57 y.o. 1956-04-22  Admit date: 11/07/2012  Discharge date and time: No discharge date for patient encounter.  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 Immediate subareolar. Receptor positive. HER-2 negative. Clinical stage T2, N1.   Operations: Procedure(s): MASTECTOMY MODIFIED RADICAL  Admission Condition: good  Discharged Condition: good  Indication for Admission: [name] is a 57 y.o. Female. She has recently been evaluated in the office for a palpable mass in the central portion of the right breast.  Imaging studies show a 2.4 cm mass in the right breast at the 12:00 position, subareolar area. The right axillary lymph nodes are abnormal appearing. Both the breast biopsy and the right axilla has been biopsied and they both showed invasive ductal carcinoma and DCIS, receptor positive, HER-2/neu negative. A second biopsy in the right breast was benign and felt to be concordant.  She has seen Dr. Drue Second. She is reluctant to undergo chemotherapy and it has asked if she can to proceed with lumpectomy and axillary lymph node dissection. I have told her that we could do that but it is my opinion that her tumor is too large and too central and this would leave a very unacceptable cosmetic deformity.We have discussed her case in breast conference and I have discussed her case with Dr. Welton Flakes.  At the end of a very lengthy discussion last week, she has elected to undergo a right modified radical mastectomy. I think this is reasonable considering her BRCA2 carrier status. I offered to place a Port-A-Cath but she is unwilling to consent to chemotherapy at this time. I told her that we would put the Port-A-Cath in later if she changes her mind. She was offered plastic surgical  consultation, but declined stating that she would do that later.   Hospital Course: On the day of admission the patient was taken to the operating room and underwent a right modified radical mastectomy. The surgery was uneventful. Minimal blood loss. She was observed overnight and did well. Good pain control. Able to ambulate. Tolerating diet. On postop day 1 the wound looks good with no signs of bleeding or hematoma or necrosis. She was instructed in diet and activities. She was instructed in wound and drain care. She wanted to go home and so she was discharged. She was given a prescription for Vicodin. She will return to see me for a wound check in one week. Pathology report is pending at this time.  Consults: None  Significant Diagnostic Studies: surgical pathology  Treatments: surgery: Right modified radical mastectomy.  Disposition: Home  Patient Instructions:    Medication List    TAKE these medications       HYDROcodone-acetaminophen 5-325 MG per tablet  Commonly known as:  NORCO/VICODIN  Take 1-2 tablets by mouth every 4 (four) hours as needed for pain.     MULTIVITAMIN PO  Take by mouth daily.        Activity: driving his structures, wound care instructions, activity instructions were discussed in detail and printed in her discharge instructions. Diet: regular diet Wound Care: as directed  Follow-up:  With Dr. Derrell Lolling in 1 week.  Signed: Angelia Mould. Derrell Lolling, M.D., FACS General and minimally invasive surgery Breast and Colorectal Surgery  11/08/2012, 6:15 AM

## 2012-11-10 ENCOUNTER — Telehealth (INDEPENDENT_AMBULATORY_CARE_PROVIDER_SITE_OTHER): Payer: Self-pay | Admitting: General Surgery

## 2012-11-10 NOTE — Telephone Encounter (Signed)
Final pathology report shows that the invasive cancer in the breast was 3.5 cm,  margins negative,  1/13 axillary nodes  positive for metastatic carcinoma. I called the patient and discussed the report with her. She expressed appreciation for the phone call and stated that she was doing well with her pain and with drain management. She has an appointment to see me next week.   [name]. Derrell Lolling, M.D., [facility] General and Minimally invasive Surgery Breast and Colorectal Surgery Office:   [phone] Pager:   [phone]

## 2012-11-15 ENCOUNTER — Encounter: Payer: Self-pay | Admitting: *Deleted

## 2012-11-15 DIAGNOSIS — C50111 Malignant neoplasm of central portion of right female breast: Secondary | ICD-10-CM

## 2012-11-16 ENCOUNTER — Telehealth: Payer: Self-pay | Admitting: *Deleted

## 2012-11-16 ENCOUNTER — Encounter (INDEPENDENT_AMBULATORY_CARE_PROVIDER_SITE_OTHER): Payer: Self-pay | Admitting: General Surgery

## 2012-11-16 ENCOUNTER — Ambulatory Visit (INDEPENDENT_AMBULATORY_CARE_PROVIDER_SITE_OTHER): Payer: Commercial Managed Care - PPO | Admitting: General Surgery

## 2012-11-16 VITALS — BP 115/61 | HR 62 | Temp 99.1°F | Resp 12 | Ht 65.0 in | Wt 154.0 lb

## 2012-11-16 DIAGNOSIS — C50111 Malignant neoplasm of central portion of right female breast: Secondary | ICD-10-CM

## 2012-11-16 DIAGNOSIS — C50119 Malignant neoplasm of central portion of unspecified female breast: Secondary | ICD-10-CM

## 2012-11-16 NOTE — Progress Notes (Signed)
Patient ID: [name], female   DOB: 1956-03-19, 57 y.o.   MRN: [id number] History: This patient underwent right modified radical mastectomy on 11/07/2012. She is doing well. 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 1/13 lymph nodes positive. Negative margins. ER 100%. PR 29%, HER-2-negative, Ki 67 89%. Pathologic stage T2, N1a. Both drains are functioning normally. They are still draining a moderate amount but not excessively.  Exam: Patient looks well. Right mastectomy skin flaps are healthy. No fluid collection. No hematoma.  Assessment: Invasive carcinoma right breast, pathologic stage T2, N1 A. Doing well one week postop right mastectomy with axillary lymph node dissection BRCA2 positive  Plan: Return to see me in one week for wound and drain check. Hopefully we can get one or both drains out She was advised to see Dr. Drue Second later this month Prescription for postmastectomy bra and prosthesis given the patient today I discussed her pathology report with her I told her we would refer her to a plastic surgeon in the future, if she decides he would like to do that.  range of motion exercises discussed. Refer to PT later.    Angelia Mould. Derrell Lolling, M.D., [facility] General and Minimally invasive Surgery Breast and Colorectal Surgery Office:   [phone] Pager:   [phone]

## 2012-11-16 NOTE — Telephone Encounter (Signed)
sw pt gv appt d/t for  11/30/12 w/ labs@12noon  and ov @ 12:30pm. Pt is aware that i will mail a letter/avs as well...td

## 2012-11-16 NOTE — Patient Instructions (Addendum)
Your right mastectomy appears to be healing without any obvious complications.  We are going to leave the drains in for now  Be sure to make an appointment to see Dr. Drue Second later this month  You have been given a prescription for postmastectomy bra and prosthesis. We will refer you to a plastic surgeon later, if you decide you would like to do that.  Return to see Dr. Derrell Lolling in one week.

## 2012-11-23 ENCOUNTER — Encounter (INDEPENDENT_AMBULATORY_CARE_PROVIDER_SITE_OTHER): Payer: Self-pay | Admitting: General Surgery

## 2012-11-23 ENCOUNTER — Ambulatory Visit (INDEPENDENT_AMBULATORY_CARE_PROVIDER_SITE_OTHER): Payer: Commercial Managed Care - PPO | Admitting: General Surgery

## 2012-11-23 VITALS — BP 110/70 | HR 66 | Temp 98.3°F | Resp 12 | Ht 65.0 in | Wt 154.4 lb

## 2012-11-23 DIAGNOSIS — C50111 Malignant neoplasm of central portion of right female breast: Secondary | ICD-10-CM

## 2012-11-23 DIAGNOSIS — C50119 Malignant neoplasm of central portion of unspecified female breast: Secondary | ICD-10-CM

## 2012-11-23 NOTE — Patient Instructions (Signed)
You are recovering from your right mastectomy without any obvious surgical problems.  We removed both drains today. You may start taking a shower tomorrow.  You'll be referred to physical therapy  Return to see Dr. Derrell Lolling in one month, sooner if there are any wound problems.

## 2012-11-23 NOTE — Progress Notes (Signed)
Patient ID: YUE FLANIGAN, female   DOB: February 19, 1956, 57 y.o.   MRN: [id number]  History: This patient underwent right modified radical mastectomy on 11/07/2012. She is doing well. 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 1/13 lymph nodes positive. Negative margins. ER 100%. PR 29%, HER-2-negative, Ki 67 89%. Pathologic stage T2, N1a.  Both drains are functioning normally.  Each less than 10 cc/day.  Exam:   Alert. No distress. Mastectomy would looks good. No infection. No fluid Both drains removed   Assessment: Invasive carcinoma right breast, pathologic stage T2, N1 A.  Doing well two weeks postop right mastectomy with axillary lymph node dissection  BRCA2 positive  Plan:  Refer PT See Dr. Welton Flakes 7/17 See me one month   Angelia Mould. Derrell Lolling, M.D., [facility] General and Minimally invasive Surgery Breast and Colorectal Surgery Office:   [phone] Pager:   [phone]

## 2012-11-28 ENCOUNTER — Encounter: Payer: Self-pay | Admitting: *Deleted

## 2012-11-28 NOTE — Progress Notes (Signed)
Mailed after appt letter to pt. 

## 2012-11-29 ENCOUNTER — Ambulatory Visit: Payer: 59 | Attending: General Surgery | Admitting: Physical Therapy

## 2012-11-29 DIAGNOSIS — M24519 Contracture, unspecified shoulder: Secondary | ICD-10-CM | POA: Insufficient documentation

## 2012-11-29 DIAGNOSIS — M25519 Pain in unspecified shoulder: Secondary | ICD-10-CM | POA: Insufficient documentation

## 2012-11-29 DIAGNOSIS — IMO0001 Reserved for inherently not codable concepts without codable children: Secondary | ICD-10-CM | POA: Insufficient documentation

## 2012-11-29 DIAGNOSIS — C50919 Malignant neoplasm of unspecified site of unspecified female breast: Secondary | ICD-10-CM | POA: Insufficient documentation

## 2012-11-29 DIAGNOSIS — Z9010 Acquired absence of unspecified breast and nipple: Secondary | ICD-10-CM | POA: Insufficient documentation

## 2012-11-30 ENCOUNTER — Ambulatory Visit (HOSPITAL_BASED_OUTPATIENT_CLINIC_OR_DEPARTMENT_OTHER): Payer: 59 | Admitting: Oncology

## 2012-11-30 ENCOUNTER — Telehealth: Payer: Self-pay | Admitting: *Deleted

## 2012-11-30 ENCOUNTER — Other Ambulatory Visit (HOSPITAL_BASED_OUTPATIENT_CLINIC_OR_DEPARTMENT_OTHER): Payer: 59 | Admitting: Lab

## 2012-11-30 ENCOUNTER — Encounter: Payer: Self-pay | Admitting: Oncology

## 2012-11-30 VITALS — BP 128/70 | HR 69 | Temp 98.2°F | Resp 20 | Ht 65.0 in | Wt 153.6 lb

## 2012-11-30 DIAGNOSIS — C50111 Malignant neoplasm of central portion of right female breast: Secondary | ICD-10-CM

## 2012-11-30 DIAGNOSIS — Z170 Estrogen receptor positive status [ER+]: Secondary | ICD-10-CM

## 2012-11-30 DIAGNOSIS — C50119 Malignant neoplasm of central portion of unspecified female breast: Secondary | ICD-10-CM

## 2012-11-30 LAB — CBC WITH DIFFERENTIAL/PLATELET
BASO%: 0.6 % (ref 0.0–2.0)
Basophils Absolute: 0 10*3/uL (ref 0.0–0.1)
EOS%: 2.1 % (ref 0.0–7.0)
Eosinophils Absolute: 0.1 10*3/uL (ref 0.0–0.5)
HCT: 38.4 % (ref 34.8–46.6)
HGB: 13 g/dL (ref 11.6–15.9)
LYMPH%: 41.6 % (ref 14.0–49.7)
MCH: 28.6 pg (ref 25.1–34.0)
MCHC: 33.9 g/dL (ref 31.5–36.0)
MCV: 84.3 fL (ref 79.5–101.0)
MONO#: 0.3 10*3/uL (ref 0.1–0.9)
MONO%: 7 % (ref 0.0–14.0)
NEUT#: 2.4 10*3/uL (ref 1.5–6.5)
NEUT%: 48.7 % (ref 38.4–76.8)
Platelets: 352 10*3/uL (ref 145–400)
RBC: 4.55 10*6/uL (ref 3.70–5.45)
RDW: 13.4 % (ref 11.2–14.5)
WBC: 4.9 10*3/uL (ref 3.9–10.3)
lymph#: 2 10*3/uL (ref 0.9–3.3)

## 2012-11-30 LAB — COMPREHENSIVE METABOLIC PANEL (CC13)
Albumin: 3.8 g/dL (ref 3.5–5.0)
Alkaline Phosphatase: 101 U/L (ref 40–150)
BUN: 15.2 mg/dL (ref 7.0–26.0)
CO2: 28 mEq/L (ref 22–29)
Glucose: 85 mg/dl (ref 70–140)
Sodium: 143 mEq/L (ref 136–145)
Total Bilirubin: 0.38 mg/dL (ref 0.20–1.20)
Total Protein: 7.5 g/dL (ref 6.4–8.3)

## 2012-11-30 NOTE — Telephone Encounter (Signed)
appts was made and printed. Pt is aware that Clydie Braun will call me with an appts w/ Michell Heinrich...td

## 2012-11-30 NOTE — Patient Instructions (Addendum)
#  1 today we discussed your pathology. We also discussed treatment options.  #2 I have recommended starting adjuvant chemotherapy for stage II, node positive breast cancer. A chemotherapy regimen I recommended was 5-FU epirubicin and cyclophosphamide to be given every 2 weeks for a total of 6 cycles followed by Taxol weekly for 12 weeks.  #3 we also discussed referral to radiation oncology to receive postmastectomy radiation since you are node positive. I have referred you to Dr. Lurline Hare.  #4 new feel that you need to think a little bit more about the chemotherapy part and you have indicated to me that you will me know what your final decision is.  #5 I asked that she wants to see me back in a few weeks time. However if you need to be seen sooner be happy to accommodate your request.

## 2012-11-30 NOTE — Telephone Encounter (Signed)
sw pt gv appt for [name] for 12/22/12 @ 9:30am. Pt is aware.Marland Kitchentd

## 2012-12-06 ENCOUNTER — Telehealth (INDEPENDENT_AMBULATORY_CARE_PROVIDER_SITE_OTHER): Payer: Self-pay

## 2012-12-06 ENCOUNTER — Ambulatory Visit: Payer: 59

## 2012-12-06 ENCOUNTER — Encounter (INDEPENDENT_AMBULATORY_CARE_PROVIDER_SITE_OTHER): Payer: Self-pay

## 2012-12-06 NOTE — Telephone Encounter (Signed)
I called the pt and asked her when she would like to return to work.  She wants to go a little earlier than planned.  She requested 12/11/2012.  Dr Derrell Lolling had said 4-6 wks so that is ok.  I will type a note up and she will pick it up.

## 2012-12-06 NOTE — Telephone Encounter (Signed)
Message copied by Ivory Broad on Wed Dec 06, 2012  1:47 PM ------      Message from: Larry Sierras      Created: Wed Dec 06, 2012 11:06 AM      Regarding: RTW      Contact: [phone]       PT REQ RTW NOTE/GM ------

## 2012-12-11 ENCOUNTER — Ambulatory Visit: Payer: 59 | Admitting: Physical Therapy

## 2012-12-18 ENCOUNTER — Encounter: Payer: 59 | Admitting: Physical Therapy

## 2012-12-22 ENCOUNTER — Ambulatory Visit: Payer: 59

## 2012-12-22 ENCOUNTER — Ambulatory Visit: Payer: 59 | Admitting: Radiation Oncology

## 2012-12-24 NOTE — Progress Notes (Signed)
OFFICE PROGRESS NOTE  CC Dr. Nicholaus Bloom Dr. Claud Kelp   DIAGNOSIS: 57 year old female with new diagnosis of right breast cancer measuring 2.4 cm status post right mastectomy  STAGE:  Cancer of central portion of female breast  Primary site: Breast (Right)  Staging method: AJCC 7th Edition  Clinical: Stage IIB (T2, N1, cM0) signed by Victorino December, MD on 11/17/2012 12:30 PM  Summary: Stage IIB (T2, N1, cM0  PRIOR THERAPY:  #1history of BRCA2 gene mutation. Patient also has a history of left breast biopsy that showed what she tells me was a breast cancer. She underwent a lumpectomy in [place] 2000. She thereafter did not receive any standard therapy but instead [employer] therapy in [place].. And apparently has been cured of that disease  #2 patient had a mammogram performed and she was found to have a 2.4 cm mass in the right breast at the 12:00 position in the subareolar region. She also was noted to have a abnormal right axillary lymph node. 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 The lymph node was biopsied as well and that was also positive for metastatic carcinoma. Patient subsequently went on to have MRI of the breasts performed in the right breast MRI showed 2.5 x 2.7 x 2.5 cm irregular enhancing mass at the 12:00 position of the right breast anterior third. It contained biopsy clip artifact. She also was found to have a second 3 x 7 x 3 mm circumscribed oval enhancing mass within the slightly upper outer right breast at the junction of the middle and posterior thirds. In the left breast there were no suspicious findings. The lymph nodes on the right revealed enlarged level I with biopsy changes. No other enlarged or abnormal appearing lymph nodes were identified.   #3 patient was seen by me on 10/23/2012 to discuss neoadjuvant  chemotherapy followed by surgery. However patient opted to have a mastectomy of the right breast. She was seen by Dr. Claud Kelp and she underwent mastectomy with axillary lymph node dissection on 11/07/2012. The final pathology revealed  INVASIVE DUCTAL CARCINOMA, 3.5 CM, MSBR GRADE III OF III. - HIGH GRADE DUCTAL CARCINOMA IN SITU. - MARGINS NOT INVOLVED. - METASTATIC CARCINOMA IN ONE OF THIRTEEN LYMPH NODES (1/13).  #4 patient is now seen in medical oncology for discussion of adjuvant treatment. It is recommended that she proceed with adjuvant chemotherapy followed by radiation therapy. I have recommended that she would receive Adriamycin Cytoxan dose dense x4 cycles followed by Taxol weekly x12 weeks and then antiestrogen therapy with an aromatase inhibitor. We discussed the risks benefits and side effects. Certainly she will need a Port-A-Cath placed by Dr. Claud Kelp.  CURRENT THERAPY: adjuvant chemotherapy but patient at this time is undecided  INTERVAL HISTORY: JANIQUA FRISCIA 57 y.o. female returns for followup post mastectomy. She is healing well. She has no nausea vomiting fevers chills night sweats. She does not have any evidence of lymphedema in the right arm. She is feeling well no infections noted. Remainder of the 10 point review of systems is negative.  MEDICAL HISTORY: Past Medical History  Diagnosis Date  . Ovarian cyst   . Breast calcification, left   . Diverticulitis     when it flares pt. uses Probiotic, also use baking soda & honey everyday   . Breast cancer     "  right" (11/07/2012)    ALLERGIES:  is allergic to penicillins.  MEDICATIONS:  Current Outpatient Prescriptions  Medication Sig Dispense Refill  . HYDROcodone-acetaminophen (NORCO/VICODIN) 5-325 MG per tablet Take 1-2 tablets by mouth every 4 (four) hours as needed for pain.  30 tablet  1  . Multiple Vitamins-Minerals (MULTIVITAMIN PO) Take by mouth daily.       No current facility-administered  medications for this visit.    SURGICAL HISTORY:  Past Surgical History  Procedure Laterality Date  . Mastectomy modified radical Right 11/07/2012  . Vaginal hysterectomy  2012  . Vaginal delivery      x2  . 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 Breast biopsy Right 10/2012  . Mastectomy modified radical Right 11/07/2012    Procedure: MASTECTOMY MODIFIED RADICAL;  Surgeon: Ernestene Mention, MD;  Location: [facility] OR;  Service: General;  Laterality: Right;    REVIEW OF SYSTEMS:  Pertinent items are noted in HPI.   HEALTH MAINTENANCE:  PHYSICAL EXAMINATION: Blood pressure 128/70, pulse 69, temperature 98.2 F (36.8 C), temperature source Oral, resp. rate 20, height 5\' 5"  (1.651 m), weight 153 lb 9.6 oz (69.673 kg). Body mass index is 25.56 kg/(m^2). ECOG PERFORMANCE STATUS: 0 - Asymptomatic   General appearance: alert, cooperative and appears stated age Resp: clear to auscultation bilaterally Cardio: regular rate and rhythm GI: soft, non-tender; bowel sounds normal; no masses,  no organomegaly Extremities: extremities normal, atraumatic, no cyanosis or edema Neurologic: Grossly normal Right mastectomy site looks clean no evidence of infection it is healing well. Left breast: Well-healed surgical scar from prior lumpectomy no masses nipple discharge per  LABORATORY DATA: Lab Results  Component Value Date   WBC 4.9 11/30/2012   HGB 13.0 11/30/2012   HCT 38.4 11/30/2012   MCV 84.3 11/30/2012   PLT 352 11/30/2012      Chemistry      Component Value Date/Time   NA 143 11/30/2012 1144   NA 139 11/08/2012 0620   K 3.8 11/30/2012 1144   K 4.2 11/08/2012 0620   CL 105 11/08/2012 0620   CL 106 10/23/2012 1459   CO2 28 11/30/2012 1144   CO2 30 11/08/2012 0620   BUN 15.2 11/30/2012 1144   BUN 8 11/08/2012 0620   CREATININE 0.9 11/30/2012 1144   CREATININE 0.74 11/08/2012 0620   CREATININE 0.82 08/18/2011 1056      Component Value Date/Time    CALCIUM 9.9 11/30/2012 1144   CALCIUM 9.0 11/08/2012 0620   ALKPHOS 101 11/30/2012 1144   ALKPHOS 96 11/06/2012 0850   AST 19 11/30/2012 1144   AST 15 11/06/2012 0850   ALT 27 11/30/2012 1144   ALT 14 11/06/2012 0850   BILITOT 0.38 11/30/2012 1144   BILITOT 0.4 11/06/2012 0850    ADDITIONAL INFORMATION: CHROMOGENIC IN-SITU HYBRIDIZATION Results: HER-2/NEU BY CISH - NO AMPLIFICATION OF HER-2 DETECTED. RESULT RATIO OF HER2: CEP 17 SIGNALS 1.50 AVERAGE HER2 COPY NUMBER PER CELL 1.80 REFERENCE RANGE NEGATIVE HER2/Chr17 Ratio <2.0 and Average HER2 copy number <4.0 EQUIVOCAL HER2/Chr17 Ratio <2.0 and Average HER2 copy number 4.0 and <6.0 POSITIVE HER2/Chr17 Ratio >=2.0 and/or Average HER2 copy number >=6.0 Pecola Leisure MD Pathologist, Electronic Signature ( Signed 11/14/2012) FINAL DIAGNOSIS Diagnosis Breast, modified radical mastectomy , Right - INVASIVE DUCTAL CARCINOMA, 3.5 CM, MSBR GRADE III OF III. - HIGH GRADE DUCTAL CARCINOMA IN SITU. - MARGINS NOT INVOLVED. - METASTATIC CARCINOMA IN ONE OF THIRTEEN LYMPH NODES (1/13).  Microscopic Comment BREAST, INVASIVE TUMOR, WITH LYMPH NODE SAMPLING Specimen, including laterality: Right breast Procedure: Mastectomy 1 of 3 FINAL for [facility], [place] [phone]) Microscopic Comment(continued) Grade: III Tubule formation: 3 Nuclear pleomorphism: 3 Mitotic: 3 Tumor size (gross measurement: 3.5 cm Margins: Free of tumor Invasive, distance to closest margin: 2.3 cm from posterior margin In-situ, distance to closest margin: 2.3 cm from posterior margin If margin positive, focally or broadly: N/A Lymphovascular invasion: Present Ductal carcinoma in situ: Present Grade: High grade Extensive intraductal component: No Lobular neoplasia: Present Tumor focality: Unifocal Treatment effect: No If present, treatment effect in breast tissue, lymph nodes or both: N/A Extent of tumor: Skin: Free of tumor Nipple: Free of tumor Skeletal muscle:  N/A Lymph nodes: # examined: 13 Lymph nodes with metastasis: 1 Isolated tumor cells (< 0.2 mm): 0 Micrometastasis: (> 0.2 mm and < 2.0 mm): 0 Macrometastasis: (> 2.0 mm): 1 Extracapsular extension: Present Breast prognostic profile: Case [id number] Estrogen receptor: 100%, strong staining Progesterone receptor: 29%, strong staining Her 2 neu: No amplification, ratio is 0.93; will be repeated on current specimen. Ki-67: 89% Non-neoplastic breast: Focal atypical lobular hyperplasia TNM: pT2, pN1a, pMX (JDP:kh 11-09-12) Jimmy Picket MD Pathologist, Electronic Signature (Case signed 11/09/2012)    RADIOGRAPHIC STUDIES:  No results found.  ASSESSMENT: 56 year old female with  #1 prior history of DCIS of the left breast underwent a lumpectomy. She thereafter did not receive any further traditional treatment such as radiation or antiestrogen therapy. She did receive crystal therapy in [place].  #2 now with new diagnosis of stage II node positive invasive ductal carcinoma, high grade, one of 13 lymph nodes positive for metastatic disease, ER positive PR positive with an elevated Ki-67. She has undergone a mastectomy with axillary lymph node dissection on 11/07/2012. The final pathology did reveal 3.5 cm disease.  #3 I have recommended adjuvant chemotherapy with Adriamycin Cytoxan followed by Taxol. She would then go on to receive radiation therapy because she is node-positive. She has an appointment set up to see Dr. Lurline Hare. We also discussed antiestrogen therapy with an aromatase inhibitors and she is postmenopausal. I have recommended 5 years of aromatase inhibitor.   PLAN:   #1 patient at this time would like to think about her options. She may even be considering a second opinion which I do think is very appropriate.  #2 she will let me know what her final decision is in a few weeks time.   All questions were answered. The patient knows to call the clinic with any  problems, questions or concerns. We can certainly see the patient much sooner if necessary.  I spent 30 minutes counseling the patient face to face. The total time spent in the appointment was 30 minutes.    Drue Second, MD Medical/Oncology [facility] [phone] (beeper) [phone] (Office)

## 2012-12-27 ENCOUNTER — Encounter (INDEPENDENT_AMBULATORY_CARE_PROVIDER_SITE_OTHER): Payer: Commercial Managed Care - PPO | Admitting: General Surgery

## 2013-01-01 ENCOUNTER — Encounter (INDEPENDENT_AMBULATORY_CARE_PROVIDER_SITE_OTHER): Payer: Self-pay

## 2013-02-15 ENCOUNTER — Telehealth: Payer: Self-pay | Admitting: Oncology

## 2013-02-15 NOTE — Telephone Encounter (Signed)
, °

## 2013-02-16 ENCOUNTER — Ambulatory Visit: Payer: 59 | Admitting: Oncology

## 2013-03-01 ENCOUNTER — Telehealth: Payer: Self-pay | Admitting: Oncology

## 2013-03-08 ENCOUNTER — Other Ambulatory Visit: Payer: Self-pay | Admitting: Emergency Medicine

## 2013-03-09 ENCOUNTER — Telehealth: Payer: Self-pay | Admitting: Oncology

## 2013-03-16 ENCOUNTER — Ambulatory Visit: Payer: 59 | Admitting: Oncology

## 2013-03-22 ENCOUNTER — Other Ambulatory Visit: Payer: Self-pay | Admitting: Emergency Medicine

## 2013-03-22 ENCOUNTER — Other Ambulatory Visit: Payer: Self-pay

## 2013-03-26 ENCOUNTER — Telehealth: Payer: Self-pay | Admitting: Oncology

## 2013-03-26 ENCOUNTER — Encounter: Payer: Self-pay | Admitting: Adult Health

## 2013-03-26 ENCOUNTER — Ambulatory Visit (HOSPITAL_BASED_OUTPATIENT_CLINIC_OR_DEPARTMENT_OTHER): Payer: 59 | Admitting: Lab

## 2013-03-26 ENCOUNTER — Ambulatory Visit (HOSPITAL_BASED_OUTPATIENT_CLINIC_OR_DEPARTMENT_OTHER): Payer: 59 | Admitting: Adult Health

## 2013-03-26 VITALS — BP 151/84 | HR 85 | Temp 97.9°F | Resp 20 | Ht 65.0 in | Wt 157.5 lb

## 2013-03-26 DIAGNOSIS — C50111 Malignant neoplasm of central portion of right female breast: Secondary | ICD-10-CM

## 2013-03-26 DIAGNOSIS — C50119 Malignant neoplasm of central portion of unspecified female breast: Secondary | ICD-10-CM

## 2013-03-26 DIAGNOSIS — E559 Vitamin D deficiency, unspecified: Secondary | ICD-10-CM

## 2013-03-26 DIAGNOSIS — C50919 Malignant neoplasm of unspecified site of unspecified female breast: Secondary | ICD-10-CM

## 2013-03-26 DIAGNOSIS — Z170 Estrogen receptor positive status [ER+]: Secondary | ICD-10-CM

## 2013-03-26 DIAGNOSIS — E2839 Other primary ovarian failure: Secondary | ICD-10-CM

## 2013-03-26 DIAGNOSIS — C773 Secondary and unspecified malignant neoplasm of axilla and upper limb lymph nodes: Secondary | ICD-10-CM

## 2013-03-26 DIAGNOSIS — Z87898 Personal history of other specified conditions: Secondary | ICD-10-CM

## 2013-03-26 LAB — COMPREHENSIVE METABOLIC PANEL (CC13)
Albumin: 4 g/dL (ref 3.5–5.0)
Anion Gap: 10 mEq/L (ref 3–11)
BUN: 16 mg/dL (ref 7.0–26.0)
CO2: 28 mEq/L (ref 22–29)
Calcium: 10 mg/dL (ref 8.4–10.4)
Chloride: 104 mEq/L (ref 98–109)
Glucose: 93 mg/dl (ref 70–140)
Potassium: 4.2 mEq/L (ref 3.5–5.1)
Sodium: 142 mEq/L (ref 136–145)
Total Protein: 7.5 g/dL (ref 6.4–8.3)

## 2013-03-26 LAB — CBC WITH DIFFERENTIAL/PLATELET
Basophils Absolute: 0 10*3/uL (ref 0.0–0.1)
Eosinophils Absolute: 0.1 10*3/uL (ref 0.0–0.5)
HGB: 13.4 g/dL (ref 11.6–15.9)
MCV: 85.1 fL (ref 79.5–101.0)
MONO#: 0.2 10*3/uL (ref 0.1–0.9)
NEUT#: 2.7 10*3/uL (ref 1.5–6.5)
RBC: 4.77 10*6/uL (ref 3.70–5.45)
RDW: 13 % (ref 11.2–14.5)
WBC: 4.5 10*3/uL (ref 3.9–10.3)

## 2013-03-26 NOTE — Progress Notes (Addendum)
OFFICE PROGRESS NOTE  CC Dr. Nicholaus Bloom Dr. Claud Kelp   DIAGNOSIS: 57 year old female with new diagnosis of right breast cancer measuring 2.4 cm status post right mastectomy  STAGE:  Cancer of central portion of female breast  Primary site: Breast (Right)  Staging method: AJCC 7th Edition  Clinical: Stage IIB (T2, N1, cM0) signed by Victorino December, MD on 11/17/2012 12:30 PM  Summary: Stage IIB (T2, N1, cM0  PRIOR THERAPY:  #1history of BRCA2 gene mutation. Patient also has a history of left breast biopsy that showed what she tells me was a breast cancer. She underwent a lumpectomy in [place] 2000. She thereafter did not receive any standard therapy but instead [employer] therapy in [place].. And apparently has been cured of that disease  #2 patient had a mammogram performed and she was found to have a 2.4 cm mass in the right breast at the 12:00 position in the subareolar region. She also was noted to have a abnormal right axillary lymph node. 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 The lymph node was biopsied as well and that was also positive for metastatic carcinoma. Patient subsequently went on to have MRI of the breasts performed in the right breast MRI showed 2.5 x 2.7 x 2.5 cm irregular enhancing mass at the 12:00 position of the right breast anterior third. It contained biopsy clip artifact. She also was found to have a second 3 x 7 x 3 mm circumscribed oval enhancing mass within the slightly upper outer right breast at the junction of the middle and posterior thirds. In the left breast there were no suspicious findings. The lymph nodes on the right revealed enlarged level I with biopsy changes. No other enlarged or abnormal appearing lymph nodes were identified.   #3 patient was seen by me on 10/23/2012 to discuss neoadjuvant  chemotherapy followed by surgery. However patient opted to have a mastectomy of the right breast. She was seen by Dr. Claud Kelp and she underwent mastectomy with axillary lymph node dissection on 11/07/2012. The final pathology revealed  INVASIVE DUCTAL CARCINOMA, 3.5 CM, MSBR GRADE III OF III. - HIGH GRADE DUCTAL CARCINOMA IN SITU. - MARGINS NOT INVOLVED. - METASTATIC CARCINOMA IN ONE OF THIRTEEN LYMPH NODES (1/13).  #4 patient is now seen in medical oncology for discussion of adjuvant treatment. It is recommended that she proceed with adjuvant chemotherapy followed by radiation therapy. I have recommended that she would receive Adriamycin Cytoxan dose dense x4 cycles followed by Taxol weekly x12 weeks and then antiestrogen therapy with an aromatase inhibitor. We discussed the risks benefits and side effects. Certainly she will need a Port-A-Cath placed by Dr. Claud Kelp.  She however declines chemotherapy and is undecided about anti-estrogen therapy at this point.    CURRENT THERAPY: observation.    INTERVAL HISTORY: [name] 57 y.o. female returns for followup appointment.  She would like her blood work evaluated.  She has declined receiving adjuvant chemotherapy, however, she has been utilizing blue crystal alternative therapy.  This is in the form of a patch on her shoulder that she wore for 5 weeks straight.  She states she is feeling great, and describes the blue crystal patch being painful to her skin.  Otherwise, she denies fevers, chills, nausea, vomiting, unintentional weight loss, pain, or any further concerns.  She is  also planning on having a colonoscopy repeated by [facility].    MEDICAL HISTORY: Past Medical History  Diagnosis Date  . Ovarian cyst   . Breast calcification, left   . Diverticulitis     when it flares pt. uses Probiotic, also use baking soda & honey everyday   . Breast cancer     "right" (11/07/2012)    ALLERGIES:  is allergic to  penicillins.  MEDICATIONS:  Current Outpatient Prescriptions  Medication Sig Dispense Refill  . HYDROcodone-acetaminophen (NORCO/VICODIN) 5-325 MG per tablet Take 1-2 tablets by mouth every 4 (four) hours as needed for pain.  30 tablet  1  . Multiple Vitamins-Minerals (MULTIVITAMIN PO) Take by mouth daily.      Marland Kitchen UNABLE TO FIND [id number] Silicone Breast Prosthesis, Rt quantity 1 L8020 Mastectomy form, Rt, quantity 1 L8000 Mastectomy bra, quantity 6       No current facility-administered medications for this visit.    SURGICAL HISTORY:  Past Surgical History  Procedure Laterality Date  . Mastectomy modified radical Right 11/07/2012  . Vaginal hysterectomy  2012  . Vaginal delivery      x2  . 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 Breast biopsy Right 10/2012  . Mastectomy modified radical Right 11/07/2012    Procedure: MASTECTOMY MODIFIED RADICAL;  Surgeon: Ernestene Mention, MD;  Location[facility] OR;  Service: General;  Laterality: Right;    REVIEW OF SYSTEMS:  A 10 point review of systems was conducted and is otherwise negative except for what is noted above.      Health Maintenance  Mammogram: 11/2012 Colonoscopy: 6 years ago Bone Density Scan: 10 years ago Pap Smear: 2011 Eye Exam: unknown Vitamin D Level: todays labs Lipid Panel:  Recommended draw by PCP   PHYSICAL EXAMINATION: Blood pressure 151/84, pulse 85, temperature 97.9 F (36.6 C), temperature source Oral, resp. rate 20, height 5\' 5"  (1.651 m), weight 157 lb 8 oz (71.442 kg). Body mass index is 26.21 kg/(m^2). General: Patient is a well appearing female in no acute distress HEENT: PERRLA, sclerae anicteric no conjunctival pallor, MMM Neck: supple, no palpable adenopathy Lungs: clear to auscultation bilaterally, no wheezes, rhonchi, or rales Cardiovascular: regular rate rhythm, S1, S2, no murmurs, rubs or gallops Abdomen: Soft, non-tender, non-distended,  normoactive bowel sounds, no HSM Extremities: warm and well perfused, no clubbing, cyanosis, or edema Skin: No rashes or lesions, skin scarring on scapula patient states is from blue crystal patch Neuro: Non-focal ECOG PERFORMANCE STATUS: 0 - Asymptomatic Right mastectomy without nodularity.   Left breast: Well-healed surgical scar from prior lumpectomy no masses nipple discharge  LABORATORY DATA: Lab Results  Component Value Date   WBC 4.9 11/30/2012   HGB 13.0 11/30/2012   HCT 38.4 11/30/2012   MCV 84.3 11/30/2012   PLT 352 11/30/2012      Chemistry      Component Value Date/Time   NA 143 11/30/2012 1144   NA 139 11/08/2012 0620   K 3.8 11/30/2012 1144   K 4.2 11/08/2012 0620   CL 105 11/08/2012 0620   CL 106 10/23/2012 1459   CO2 28 11/30/2012 1144   CO2 30 11/08/2012 0620   BUN 15.2 11/30/2012 1144   BUN 8 11/08/2012 0620   CREATININE 0.9 11/30/2012 1144   CREATININE 0.74 11/08/2012 0620   CREATININE 0.82 08/18/2011 1056      Component Value Date/Time   CALCIUM 9.9 11/30/2012 1144   CALCIUM 9.0  11/08/2012 0620   ALKPHOS 101 11/30/2012 1144   ALKPHOS 96 11/06/2012 0850   AST 19 11/30/2012 1144   AST 15 11/06/2012 0850   ALT 27 11/30/2012 1144   ALT 14 11/06/2012 0850   BILITOT 0.38 11/30/2012 1144   BILITOT 0.4 11/06/2012 0850    ADDITIONAL INFORMATION: CHROMOGENIC IN-SITU HYBRIDIZATION Results: HER-2/NEU BY CISH - NO AMPLIFICATION OF HER-2 DETECTED. RESULT RATIO OF HER2: CEP 17 SIGNALS 1.50 AVERAGE HER2 COPY NUMBER PER CELL 1.80 REFERENCE RANGE NEGATIVE HER2/Chr17 Ratio <2.0 and Average HER2 copy number <4.0 EQUIVOCAL HER2/Chr17 Ratio <2.0 and Average HER2 copy number 4.0 and <6.0 POSITIVE HER2/Chr17 Ratio >=2.0 and/or Average HER2 copy number >=6.0 Pecola Leisure MD Pathologist, Electronic Signature ( Signed 11/14/2012) FINAL DIAGNOSIS Diagnosis Breast, modified radical mastectomy , Right - INVASIVE DUCTAL CARCINOMA, 3.5 CM, MSBR GRADE III OF III. - HIGH GRADE DUCTAL CARCINOMA IN  SITU. - MARGINS NOT INVOLVED. - METASTATIC CARCINOMA IN ONE OF THIRTEEN LYMPH NODES (1/13). Microscopic Comment BREAST, INVASIVE TUMOR, WITH LYMPH NODE SAMPLING Specimen, including laterality: Right breast Procedure: Mastectomy 1 of 3 FINAL for [facility], [place] [phone]) Microscopic Comment(continued) Grade: III Tubule formation: 3 Nuclear pleomorphism: 3 Mitotic: 3 Tumor size (gross measurement: 3.5 cm Margins: Free of tumor Invasive, distance to closest margin: 2.3 cm from posterior margin In-situ, distance to closest margin: 2.3 cm from posterior margin If margin positive, focally or broadly: N/A Lymphovascular invasion: Present Ductal carcinoma in situ: Present Grade: High grade Extensive intraductal component: No Lobular neoplasia: Present Tumor focality: Unifocal Treatment effect: No If present, treatment effect in breast tissue, lymph nodes or both: N/A Extent of tumor: Skin: Free of tumor Nipple: Free of tumor Skeletal muscle: N/A Lymph nodes: # examined: 13 Lymph nodes with metastasis: 1 Isolated tumor cells (< 0.2 mm): 0 Micrometastasis: (> 0.2 mm and < 2.0 mm): 0 Macrometastasis: (> 2.0 mm): 1 Extracapsular extension: Present Breast prognostic profile: Case [id number] Estrogen receptor: 100%, strong staining Progesterone receptor: 29%, strong staining Her 2 neu: No amplification, ratio is 0.93; will be repeated on current specimen. Ki-67: 89% Non-neoplastic breast: Focal atypical lobular hyperplasia TNM: pT2, pN1a, pMX (JDP:kh 11-09-12) Jimmy Picket MD Pathologist, Electronic Signature (Case signed 11/09/2012)    RADIOGRAPHIC STUDIES:  No results found.  ASSESSMENT: 56 year old female with  #1 prior history of DCIS of the left breast underwent a lumpectomy. She thereafter did not receive any further traditional treatment such as radiation or antiestrogen therapy. She did receive crystal therapy in [place].  #2 now with new diagnosis of stage II  node positive invasive ductal carcinoma, high grade, one of 13 lymph nodes positive for metastatic disease, ER positive PR positive with an elevated Ki-67. She has undergone a mastectomy with axillary lymph node dissection on 11/07/2012. The final pathology did reveal 3.5 cm disease.  #3 I have recommended adjuvant chemotherapy with Adriamycin Cytoxan followed by Taxol. She would then go on to receive radiation therapy because she is node-positive. She has an appointment set up to see Dr. Lurline Hare. We also discussed antiestrogen therapy with an aromatase inhibitors and she is postmenopausal. I have recommended 5 years of aromatase inhibitor.  She has declined chemotherapy and will consider anti-estrogen therapy.  I gave her the names of aromatase inhibitors for her to research.  She will inform us if she would like anti-estrogen therapy.     PLAN:   #1 Patient has continued to decline adjuvant chemotherapy.  We discussed anti-estrogen therapy and she will think about this.  She  is fearful that it may cause lung cancer.  I will evaluate her labs with a CBC, CMET, and Vitamin D level.  I will also order a bone density evaluation.    #2 She will follow up with [place] in 6 months.    All questions were answered. The patient knows to call the clinic with any problems, questions or concerns. We can certainly see the patient much sooner if necessary.  I spent 25 minutes counseling the patient face to face. The total time spent in the appointment was 30 minutes.   Illa Level, NP Medical Oncology [facility] [phone]  ATTENDING'S ATTESTATION:  I personally reviewed patient's chart, examined patient myself, formulated the treatment plan as followed.   Patient has declined standard chemotherapy. She would like to proceed alternative therapies. She will be know I can be of further assistance to her.   Drue Second, MD Medical/Oncology [facility] [phone] (beeper) [phone] (Office)  04/16/2013, 3:05 AM

## 2013-03-26 NOTE — Telephone Encounter (Signed)
, °

## 2013-03-26 NOTE — Patient Instructions (Addendum)
Doing well.  We recommend anti estrogen therapy with an aromatase inhibitor such as Anastrazole, Letrozole, or Aromasin.  We will see you back in 6 months.  Please let us know if you would like anti-estrogen therapy.  Please call us if you have any questions or concerns.

## 2013-03-27 LAB — VITAMIN D 25 HYDROXY (VIT D DEFICIENCY, FRACTURES): Vit D, 25-Hydroxy: 52 ng/mL (ref 30–89)

## 2013-04-25 ENCOUNTER — Ambulatory Visit
Admission: RE | Admit: 2013-04-25 | Discharge: 2013-04-25 | Disposition: A | Discharge status: Home / Self Care | Payer: 59 | Source: Ambulatory Visit | Attending: Adult Health | Admitting: Adult Health

## 2013-04-25 DIAGNOSIS — E2839 Other primary ovarian failure: Secondary | ICD-10-CM

## 2013-05-02 ENCOUNTER — Telehealth: Payer: Self-pay | Admitting: *Deleted

## 2013-05-02 NOTE — Telephone Encounter (Signed)
Per NP request. Called pt to inform her of latest bone density results. Recommended pt to take OTC calcium 1 tablet BID(1200 mg total) and Vit D 1000IU's daily. Also encouraged pt to do weight bearing exercises. Pt verbalized understanding. No further concerns.

## 2013-06-29 ENCOUNTER — Encounter: Payer: Self-pay | Admitting: Gastroenterology

## 2013-06-29 ENCOUNTER — Ambulatory Visit (INDEPENDENT_AMBULATORY_CARE_PROVIDER_SITE_OTHER): Payer: 59 | Admitting: Gastroenterology

## 2013-06-29 VITALS — BP 150/80 | HR 80 | Ht 65.0 in | Wt 158.2 lb

## 2013-06-29 DIAGNOSIS — K5732 Diverticulitis of large intestine without perforation or abscess without bleeding: Secondary | ICD-10-CM

## 2013-06-29 DIAGNOSIS — K5792 Diverticulitis of intestine, part unspecified, without perforation or abscess without bleeding: Secondary | ICD-10-CM | POA: Insufficient documentation

## 2013-06-29 DIAGNOSIS — Z860100 Personal history of colon polyps, unspecified: Secondary | ICD-10-CM | POA: Insufficient documentation

## 2013-06-29 NOTE — Progress Notes (Signed)
_                                                                                                                History of Present Illness: 58 year old white female referred for evaluation of abdominal pain.  In October and again in November she developed left lower quadrant pain which she attributed to diverticulitis.  She has a history of diverticulitis in 2011 documented by CT scan.  Subsequent colonoscopy demonstrated diverticulosis.  An adenomatous polyp was removed.  She has been pain-free until October of last year.  In the last 2 months she's felt perfectly well.  She treated herself with a home remedy.  She moves her bowels regularly and has no history of bleeding.    Past Medical History  Diagnosis Date  . Ovarian cyst   . Breast calcification, left   . Diverticulitis     when it flares pt. uses Probiotic, also use baking soda & honey everyday   . Breast cancer     "right" (11/07/2012)   Past Surgical History  Procedure Laterality Date  . Mastectomy modified radical Right 11/07/2012  . Vaginal hysterectomy  2012  . Vaginal delivery      x2  . 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 Breast biopsy Right 10/2012  . Mastectomy modified radical Right 11/07/2012    Procedure: MASTECTOMY MODIFIED RADICAL;  Surgeon: Adin Hector, MD;  Location: [place];  Service: General;  Laterality: Right;   family history includes Cancer in her brother and mother. There is no history of Colon cancer. Current Outpatient Prescriptions  Medication Sig Dispense Refill  . Multiple Vitamins-Minerals (MULTIVITAMIN PO) Take by mouth daily.      Marland Kitchen OVER THE COUNTER MEDICATION Peptids supplements      . UNABLE TO FIND D3220 Silicone Breast Prosthesis, Rt quantity 1 L8020 Mastectomy form, Rt, quantity 1 L8000 Mastectomy bra, quantity 6       No current facility-administered medications for this visit.   Allergies as of 06/29/2013  - Review Complete 06/29/2013  Allergen Reaction Noted  . Penicillins      reports that she has never smoked. She has never used smokeless tobacco. She reports that she does not drink alcohol or use illicit drugs.     Review of Systems: Pertinent positive and negative review of systems were noted in the above HPI section. All other review of systems were otherwise negative.  Vital signs were reviewed in today's medical record Physical Exam: General: Well developed , well nourished, no acute distress Skin: anicteric Head: Normocephalic and atraumatic Eyes:  sclerae anicteric, EOMI Ears: Normal auditory acuity Mouth: No deformity or lesions Neck: Supple, no masses or thyromegaly Lungs: Clear throughout to auscultation Heart: Regular rate and rhythm; no murmurs, rubs or bruits Abdomen: Soft, non tender and non distended. No masses, hepatosplenomegaly or hernias noted. Normal Bowel sounds Rectal:deferred Musculoskeletal: Symmetrical with no gross deformities  Skin: No lesions on visible extremities Pulses:  Normal pulses noted Extremities: No clubbing, cyanosis, edema or deformities noted Neurological: Alert oriented x 4, grossly nonfocal Cervical Nodes:  No significant cervical adenopathy Inguinal Nodes: No significant inguinal adenopathy Psychological:  Alert and cooperative. Normal mood and affect  See Assessment and Plan under Problem List

## 2013-06-29 NOTE — Assessment & Plan Note (Signed)
Patient has a history of acute diverticulitis determined by CT scan.  Episodes of pain in October and November, 2014 were consistent with recurrent diverticulitis.  Patient was instructed to contact the office if she develops recurrent pain at which point I would treat her empirically with antibiotics.  She does not appear to have any sequela of diverticulosis or diverticulitis.

## 2013-06-29 NOTE — Patient Instructions (Signed)
Follow up with a colonoscopy in one year

## 2013-06-29 NOTE — Assessment & Plan Note (Signed)
Plan follow-up colonoscopy 2016 

## 2013-07-27 ENCOUNTER — Other Ambulatory Visit: Payer: Self-pay

## 2013-07-27 DIAGNOSIS — Z9011 Acquired absence of right breast and nipple: Secondary | ICD-10-CM

## 2013-07-27 DIAGNOSIS — Z1231 Encounter for screening mammogram for malignant neoplasm of breast: Secondary | ICD-10-CM

## 2013-07-27 DIAGNOSIS — Z853 Personal history of malignant neoplasm of breast: Secondary | ICD-10-CM

## 2013-08-13 ENCOUNTER — Ambulatory Visit
Admission: RE | Admit: 2013-08-13 | Discharge: 2013-08-13 | Disposition: A | Discharge status: Home / Self Care | Payer: Self-pay | Source: Ambulatory Visit

## 2013-08-13 DIAGNOSIS — Z853 Personal history of malignant neoplasm of breast: Secondary | ICD-10-CM

## 2013-08-13 DIAGNOSIS — Z1231 Encounter for screening mammogram for malignant neoplasm of breast: Secondary | ICD-10-CM

## 2013-08-13 DIAGNOSIS — Z9011 Acquired absence of right breast and nipple: Secondary | ICD-10-CM

## 2013-09-03 ENCOUNTER — Other Ambulatory Visit (INDEPENDENT_AMBULATORY_CARE_PROVIDER_SITE_OTHER): Payer: Self-pay | Admitting: *Deleted

## 2013-09-03 ENCOUNTER — Encounter (INDEPENDENT_AMBULATORY_CARE_PROVIDER_SITE_OTHER): Payer: Self-pay | Admitting: General Surgery

## 2013-09-03 ENCOUNTER — Ambulatory Visit (INDEPENDENT_AMBULATORY_CARE_PROVIDER_SITE_OTHER): Payer: Commercial Managed Care - PPO | Admitting: General Surgery

## 2013-09-03 VITALS — BP 124/74 | HR 75 | Temp 97.6°F | Ht 65.0 in | Wt 157.6 lb

## 2013-09-03 DIAGNOSIS — R229 Localized swelling, mass and lump, unspecified: Secondary | ICD-10-CM

## 2013-09-03 DIAGNOSIS — C50119 Malignant neoplasm of central portion of unspecified female breast: Secondary | ICD-10-CM

## 2013-09-03 DIAGNOSIS — C792 Secondary malignant neoplasm of skin: Secondary | ICD-10-CM

## 2013-09-03 DIAGNOSIS — R2231 Localized swelling, mass and lump, right upper limb: Secondary | ICD-10-CM | POA: Insufficient documentation

## 2013-09-03 DIAGNOSIS — R2230 Localized swelling, mass and lump, unspecified upper limb: Secondary | ICD-10-CM

## 2013-09-03 HISTORY — PX: OTHER SURGICAL HISTORY: SHX169

## 2013-09-03 NOTE — Progress Notes (Signed)
Patient ID: [name], female   DOB: Jan 25, 1956, 58 y.o.   MRN: [id number] History: This patient returns to the office today complaining of a palpable nodule in her right axilla. She underwent right modified radical mastectomy on 11/07/2012. Final pathology showed 3.5 cm invasive ductal carcinoma and DCIS. 1/13 lymph nodes positive. Negative margins. ER-positive 100%. PR positive and percent. HER-2 negative. He 6789%. Pathologic stage T2, N1a.. She declined chemotherapy. She declined antiestrogen therapy. She is followed by Dr. Marcy Panning. She noticed a lump in her right axilla one week ago and presents for evaluation  Past history, family history, social history, and review of systems are documented on the chart, unchanged, and noncontributory except as mentioned above  Exam: Patient looks well. A little anxious Lungs clear auscultation Neck no adenopathy or mass Heart regular rate and rhythm Right mastectomy skin flaps healthy. No fluid. No nodules. No ulcerations. Right axilla reveals a 1.25 cm deep palpable mass without inflammatory change. She wanted me to excise this today under local anesthesia  Procedure: Betadine prep. 1% Xylocaine with epinephrine local. Transverse incision just above the hairline. Dissected down and removed a 1.25 cm firm mass just outside the chest wall. Concern for neoplasia. Minimal bleeding. No infection. Deep tissues closed with 3-0 Vicryl. Skin closed with interrupted 4-0 nylon. Clean bandage and ice pack.  Assessment: Right axillary mass. Possibly new finding. Possibly recurrent cancer Invasive carcinoma right breast, pathologic stage TII, N1 A., 10 months postop right modified radical mastectomy BRCA2 positive  Plan: wound care discussed Call report in 2-3 days Sutures out in 7-9 days Needs appointment with Dr. Marcy Panning, most likely    [facility]. Dalbert Batman, M.D., [facility] General and Minimally invasive  Surgery Breast and Colorectal Surgery Office:   [phone] Pager:   [phone]

## 2013-09-03 NOTE — Patient Instructions (Signed)
The small lump in your right axilla was removed today in the office.  We should be able to call this report to you on Wednesday afternoon.  Keep a dry gauze bandage on this for a few days.  Keep an ice pack on this intermittently for 36 hours  Return in one week to get the sutures out.

## 2013-09-05 ENCOUNTER — Telehealth (INDEPENDENT_AMBULATORY_CARE_PROVIDER_SITE_OTHER): Payer: Self-pay | Admitting: General Surgery

## 2013-09-05 NOTE — Telephone Encounter (Signed)
Pathology report from her right axillary mass excisional biopsy shows metastatic mammary carcinoma. Breast diagnostic profile has not been done, but we will request this. I called the patient and discussed this with her. She will see me next week for suture removal. I told her that she should be evaluated by medical oncology and radiation oncology immediately, and she states that she will do that. We will arrange these referrals.   Edsel Petrin. Dalbert Batman, M.D., [facility] General and Minimally invasive Surgery Breast and Colorectal Surgery Office:   [phone] Pager:   [phone]

## 2013-09-06 ENCOUNTER — Other Ambulatory Visit (INDEPENDENT_AMBULATORY_CARE_PROVIDER_SITE_OTHER): Payer: Self-pay

## 2013-09-06 ENCOUNTER — Telehealth (INDEPENDENT_AMBULATORY_CARE_PROVIDER_SITE_OTHER): Payer: Self-pay

## 2013-09-06 DIAGNOSIS — C50919 Malignant neoplasm of unspecified site of unspecified female breast: Secondary | ICD-10-CM

## 2013-09-06 NOTE — Telephone Encounter (Signed)
Per Dr Darrel Hoover request order for Rad onc and Onc appts have been placed in epic as urgent. I have sent detailed msg to Dawn at [facility] center to assist with these. Jeannie at [facility] path will place pt on next weds ca conference. Tammy at [facility] path will place order for Br Ca Dx Profile to be done. Pt has been given appt for next week with Dr Dalbert Batman to remove sutures.

## 2013-09-07 ENCOUNTER — Encounter: Payer: Self-pay | Admitting: Radiation Oncology

## 2013-09-07 NOTE — Progress Notes (Signed)
Location of Breast Cancer: hx of right breast ca; now with excised lesion of right axilla  Histology per Pathology Report: metastatic mammary carcinoma  Receptor Status: ER(+), PR (+), Her2-neu (-)  Did patient present with symptoms (if so, please note symptoms) or was this found on screening mammography?: palpable nodule in her right axilla excised by Dr. Dalbert Batman on 09/03/2013  Past/Anticipated interventions by surgeon, if any:hx of right breast mastectomy by Dr. Dalbert Batman and left breast lumpectomy in [place] in 2000  Past/Anticipated interventions by medical oncology, if any: Refused chemotherapy and antiestrogen therapy  Lymphedema issues, if any:  None noted  Pain issues, if any:  None noted  SAFETY ISSUES:  Prior radiation? NO  Pacemaker/ICD? NO  Possible current pregnancy?NO  Is the patient on methotrexate? NO  Current Complaints / other details:  58 year old female. She has been utilizing blue crystal alternative therapy.    Heywood Footman, RN 09/07/2013,2:39 PM

## 2013-09-10 ENCOUNTER — Ambulatory Visit (INDEPENDENT_AMBULATORY_CARE_PROVIDER_SITE_OTHER): Payer: Commercial Managed Care - PPO | Admitting: General Surgery

## 2013-09-10 ENCOUNTER — Encounter (INDEPENDENT_AMBULATORY_CARE_PROVIDER_SITE_OTHER): Payer: Self-pay | Admitting: General Surgery

## 2013-09-10 ENCOUNTER — Ambulatory Visit
Admission: RE | Admit: 2013-09-10 | Discharge: 2013-09-10 | Disposition: A | Discharge status: Home / Self Care | Payer: 59 | Source: Ambulatory Visit | Attending: Radiation Oncology | Admitting: Radiation Oncology

## 2013-09-10 ENCOUNTER — Encounter: Payer: Self-pay | Admitting: Radiation Oncology

## 2013-09-10 VITALS — BP 140/72 | HR 85 | Temp 98.6°F | Wt 156.6 lb

## 2013-09-10 VITALS — BP 118/80 | HR 74 | Temp 97.6°F | Resp 14 | Wt 155.0 lb

## 2013-09-10 DIAGNOSIS — C50919 Malignant neoplasm of unspecified site of unspecified female breast: Secondary | ICD-10-CM

## 2013-09-10 DIAGNOSIS — C50119 Malignant neoplasm of central portion of unspecified female breast: Secondary | ICD-10-CM

## 2013-09-10 DIAGNOSIS — Z510 Encounter for antineoplastic radiation therapy: Secondary | ICD-10-CM | POA: Insufficient documentation

## 2013-09-10 NOTE — Progress Notes (Signed)
Patient ID: [name], female   DOB: Dec 10, 1955, 58 y.o.   MRN: [id number]   JOHNAY MANO   MRN:  [id number]   Description: 58 year old female  Provider: Adin Hector, MD  Department: [facility]                      Diagnoses      Mass of right axilla    -  Primary      782.2      Cancer of central portion of female breast          174.1                Current Vitals Most recent update: 09/03/2013  3:59 PM by Vale Haven, CMA      BP Pulse Temp(Src) Ht Wt BMI      124/74 75 97.6 F (36.4 C) $Remove'5\' 5"'[id number]$  (1.651 m) 157 lb 9.6 oz (71.487 kg) 26.23 kg/m2            Progress Notes      Adin Hector, MD   09/10/2013     Status: Signed            Patient ID: [name], female   DOB: 05/18/1955, 58 y.o.   MRN: [id number]   History:      The right axillary biopsy that was performed last week shows metastatic mammary carcinoma. We have requested breast diagnostic profile. She has no problems with wound healing. She saw Dr. Kyung Rudd today. She states he is planning radiation therapy after getting some body scans. She has an upcoming appointment with one of the medical oncologists.         She underwent right modified radical mastectomy on 11/07/2012. Final pathology showed 3.5 cm invasive ductal carcinoma and DCIS. 1/13 lymph nodes positive. Negative margins. ER-positive 100%. PR positive and percent. HER-2 negative. He 6789%. Pathologic stage T2, N1a.. She declined chemotherapy. She declined antiestrogen therapy. She is followed by Dr. Marcy Panning.    Exam: Patient looks well. A little anxious Right mastectomy skin flaps healthy. No fluid. No nodules. No ulcerations. Right axilla reveals a 1.5 cm linear healing wound. No seroma. No hematoma. No infection. Sutures removed.    Assessment: Recurrent breast cancer, right axilla. Recovering uneventfully following a biopsy Breast diagnostic protocol pending  History Invasive carcinoma right breast,  pathologic stage TII, N1 A., 10 months postop right modified radical mastectomy BRCA2 positive   Plan: Systemic workup will be organized by Dr. Kyung Rudd and Dr. Marcy Panning Upon with medical oncology is pending Check breast diagnostic profile Return to see me in 2 months       Vicky Mccanless M. Dalbert Batman, M.D., [facility] General and Minimally invasive Surgery Breast and Colorectal Surgery Office:   [phone] Pager:   [phone]         et

## 2013-09-10 NOTE — Progress Notes (Signed)
Please see the Nurse Progress Note in the MD Initial Consult Encounter for this patient. 

## 2013-09-10 NOTE — Progress Notes (Signed)
Patient here for radiation consultation.Menarche age 58, first child at age 48,took HRT for short period after hysterectomy (vaginal cream). Took alternative medicine method. Definitely doesn't want chemotherapy but will consider radiation.

## 2013-09-10 NOTE — Patient Instructions (Signed)
The incision in your right axilla is healing normally. We removed the sutures today. You may shower.  She saw Dr. Lisbeth Renshaw today. Apparently he plans radiation therapy after you get some body scans to look for cancer.  We will check on your referral to a medical oncologist. It is important that you discuss your care with Dr. Marcy Panning, or if she is not there, one of the other medical oncologists.  Return to see Dr. Dalbert Batman in 2 months.

## 2013-09-11 ENCOUNTER — Telehealth: Payer: Self-pay | Admitting: *Deleted

## 2013-09-11 NOTE — Telephone Encounter (Signed)
Pt will call shirlwey halsey tomorrow to reschedule ct for 09/14/13,she is working that day

## 2013-09-11 NOTE — Telephone Encounter (Signed)
Called patient to inform of CT for 09-14-13, lvm for a return call

## 2013-09-11 NOTE — Progress Notes (Signed)
Radiation Oncology         [phone] ________________________________  Name: [name] MRN: [id number]  Date: 09/10/2013  DOB: Jun 25, 1955  CC:No PCP Per Patient  Adin Hector, MD     REFERRING PHYSICIAN: Adin Hector, MD   DIAGNOSIS: The primary encounter diagnosis was Recurrent breast cancer. A diagnosis of Cancer of central portion of female breast was also pertinent to this visit.   HISTORY OF PRESENT ILLNESS::[name] is a 58 y.o. female who is seen for an initial consultation visit. The patient is seen today regarding her recent diagnosis of recurrent rest cancer. She originally underwent a right sided modified radical mastectomy on 11/07/2012 after she was initially diagnosed with right-sided breast cancer. The pathology returned positive for an invasive ductal carcinoma measuring 3.5 cm. This was a grade 3 tumor and the margins were negative. A total of 1/13 lymph nodes was positive.  This represented a pT2N1a tumor. The patient declined adjuvant treatment. She therefore did not receive any systemic treatment and also did not receive any postmastectomy radiation treatment.  The patient was recently seen by Dr. Dalbert Batman after noticing a lump within her right axilla which began approximately 10 days ago. She states she noticed this after working out and she discussed this with Dr. Dalbert Batman. He palpated this nodule which he felt was a approximate 1.25 cm palpable mass without any inflammatory change within the right axilla. He excised this tumor and the pathology for this specimen returned positive for metastatic mammary carcinoma which was consistent with prior tumor.  The patient remains uninterested in systemic treatment currently. I have been asked to see the patient today for consideration of radiation treatment at this time given the new finding of recurrence.  PREVIOUS RADIATION THERAPY:No}   PAST MEDICAL HISTORY:  has a past medical history of Ovarian cyst;  Breast calcification, left; Diverticulitis; and Breast cancer.     PAST SURGICAL HISTORY: Past Surgical History  Procedure Laterality Date  . Mastectomy modified radical Right 11/07/2012  . Vaginal hysterectomy  2012  . Vaginal delivery      x2  . 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 Breast biopsy Right 10/2012  . Mastectomy modified radical Right 11/07/2012    Procedure: MASTECTOMY MODIFIED RADICAL;  Surgeon: Adin Hector, MD;  Location: [facility];  Service: General;  Laterality: Right;  . Simple excision Right 09/03/2013    simple in office excision of mass right axilla     FAMILY HISTORY: family history includes Cancer in her brother and mother. There is no history of Colon cancer.   SOCIAL HISTORY:  reports that she has never smoked. She has never used smokeless tobacco. She reports that she does not drink alcohol or use illicit drugs.   ALLERGIES: Penicillins   MEDICATIONS:  Current Outpatient Prescriptions  Medication Sig Dispense Refill  . Multiple Vitamins-Minerals (MULTIVITAMIN PO) Take by mouth daily.      Marland Kitchen OVER THE COUNTER MEDICATION Peptids supplements      . UNABLE TO FIND C1660 Silicone Breast Prosthesis, Rt quantity 1 L8020 Mastectomy form, Rt, quantity 1 L8000 Mastectomy bra, quantity 6       No current facility-administered medications for this encounter.     REVIEW OF SYSTEMS:  A 15 point review of systems is documented in the electronic medical record. This was obtained by the nursing staff. However, I reviewed this with the patient to discuss relevant findings  and make appropriate changes.  Pertinent items are noted in HPI.    PHYSICAL EXAM:  weight is 156 lb 9.6 oz (71.033 kg). Her temperature is 98.6 F (37 C). Her blood pressure is 140/72 and her pulse is 85.   ECOG = 0  0 - Asymptomatic (Fully active, able to carry on all predisease activities without restriction)  1 - Symptomatic but  completely ambulatory (Restricted in physically strenuous activity but ambulatory and able to carry out work of a light or sedentary nature. For example, light housework, office work)  2 - Symptomatic, <50% in bed during the day (Ambulatory and capable of all self care but unable to carry out any work activities. Up and about more than 50% of waking hours)  3 - Symptomatic, >50% in bed, but not bedbound (Capable of only limited self-care, confined to bed or chair 50% or more of waking hours)  4 - Bedbound (Completely disabled. Cannot carry on any self-care. Totally confined to bed or chair)  5 - Death   Eustace Pen MM, Creech RH, Tormey DC, et al. [phone]). "Toxicity and response criteria of the [facility]". Rudolph Oncol. 5 (6): 649-55  General: Well-developed, in no acute distress HEENT: Normocephalic, atraumatic; oral cavity clear Neck: Supple without any lymphadenopathy Cardiovascular: Regular rate and rhythm Respiratory: Clear to auscultation bilaterally Breasts:  Status post mastectomy on the right. No nodularity along the mastectomy scar. Well healing incision within the right axilla. No lymphadenopathy. No suspicious findings or axillary adenopathy on the left. GI: Soft, nontender, normal bowel sounds Extremities: No edema present Neuro: No focal deficits     LABORATORY DATA:  Lab Results  Component Value Date   WBC 4.5 03/26/2013   HGB 13.4 03/26/2013   HCT 40.5 03/26/2013   MCV 85.1 03/26/2013   PLT 330 03/26/2013   Lab Results  Component Value Date   NA 142 03/26/2013   K 4.2 03/26/2013   CL 105 11/08/2012   CO2 28 03/26/2013   Lab Results  Component Value Date   ALT 30 03/26/2013   AST 26 03/26/2013   ALKPHOS 107 03/26/2013   BILITOT 0.46 03/26/2013      RADIOGRAPHY: Mm Screening Breast Tomo Uni L  08/14/2013   CLINICAL DATA:  Screening.  EXAM: LEFT DIGITAL SCREENING MAMMOGRAM WITH CAD  DIGITAL BREAST TOMOSYNTHESIS  Digital breast  tomosynthesis images are acquired in two projections. These images are reviewed in combination with the digital mammogram, confirming the findings below.  COMPARISON:  Previous exam(s)  ACR Breast Density Category b: There are scattered areas of fibroglandular density.  FINDINGS: The patient has had a right mastectomy. There are no findings suspicious for malignancy. Images were processed with CAD.  IMPRESSION: No mammographic evidence of malignancy. A result letter of this screening mammogram will be mailed directly to the patient.  RECOMMENDATION: Screening mammogram in one year. (Code:SM-B-01Y)  BI-RADS CATEGORY  1: Negative.   Electronically Signed   By: Luberta Robertson M.D.   On: 08/14/2013 16:17       IMPRESSION: The patient has a history of mastectomy for a 3.5 cm T2 N1 AM0 invasive ductal carcinoma of the right breast. She elected not to proceed with any adjuvant treatment. She has been found to have a recurrence which was excised and did return positive for recurrent mammary carcinoma.  I believe that the patient is very appropriate for radiation treatment comprehensively to the chest wall and regional lymph nodes at this time. I  discussed the rationale of this treatment in terms of a reduction in local/regional failure risk. I discussed the logistics of treatment as well including also the possible side effects and risks. We discussed this in some detail and all of her questions were answered. The patient indicates that she is willing to proceed with radiation treatment at this time. She therefore will be scheduled for simulation.  I also discussed with the patient possible consideration of systemic treatment. She is not interested in pursuing this at this time although she indicated that she would think about this and let been no she wishes to reconsider.   PLAN: The patient will proceed with a simulation in the near future such that we can treatment planning. I anticipate a 6-1/2 week course of  treatment to the chest wall and regional lymph nodes.      ________________________________   Jodelle Gross, MD, PhD

## 2013-09-14 ENCOUNTER — Ambulatory Visit
Admission: RE | Admit: 2013-09-14 | Discharge: 2013-09-14 | Disposition: A | Discharge status: Home / Self Care | Payer: 59 | Source: Ambulatory Visit | Attending: Radiation Oncology | Admitting: Radiation Oncology

## 2013-09-14 ENCOUNTER — Ambulatory Visit: Payer: 59 | Admitting: Radiation Oncology

## 2013-09-14 ENCOUNTER — Ambulatory Visit (HOSPITAL_COMMUNITY): Payer: 59

## 2013-09-14 DIAGNOSIS — C50119 Malignant neoplasm of central portion of unspecified female breast: Secondary | ICD-10-CM

## 2013-09-14 NOTE — Progress Notes (Signed)
  Radiation Oncology         [phone] ________________________________  Name: [name] MRN: [id number]  Date: 09/14/2013  DOB: [birth year]   SIMULATION AND TREATMENT PLANNING NOTE  The patient presented for simulation prior to beginning her course of radiation treatment for her diagnosis of right-sided breast cancer. The patient was placed in a supine position on a breast board. A customized accuform device was also constructed and this complex treatment device will be used on a daily basis during her treatment. In this fashion, a CT scan was obtained through the chest area and an isocenter was placed near the chest wall at the upper aspect of the right chest.  The patient will be planned to receive a course of radiation initially to a dose of 50.4 gray. This will consist of a 4 field technique targeting the chest wall as well as the supraclavicular region. Therefore 2 customized medial and lateral tangent fields have been created targeting the chest wall, and also 2 additional customized fields have been designed to treat the supraclavicular region both with a right supraclavicular field and a right posterior axillary boost field. This treatment will be accomplished at 1.8 gray per fraction. A 3-D conformal technique has been ordered for the patient's treatment. Dose volume histograms for the following structures will be carefully reviewed, which have been contoured: Scar, lungs, spinal cord, heart. A forward planning technique will also be evaluated to determine if this approach improves the plan. It is anticipated that the patient will then receive a 10 gray boost to the scar plus margin. This will be accomplished at 2 gray per fraction. The final anticipated total dose therefore will correspond to 60.4 gray.    _______________________________   Jodelle Gross, MD, PhD

## 2013-09-14 NOTE — Progress Notes (Signed)
  Radiation Oncology         [phone] ________________________________  Name: [name] MRN: [id number]  Date: 09/14/2013  DOB: 11-28-1955  Optical Surface Tracking Plan:  Since intensity modulated radiotherapy (IMRT) and 3D conformal radiation treatment methods are predicated on accurate and precise positioning for treatment, intrafraction motion monitoring is medically necessary to ensure accurate and safe treatment delivery.  The ability to quantify intrafraction motion without excessive ionizing radiation dose can only be performed with optical surface tracking. Accordingly, surface imaging offers the opportunity to obtain 3D measurements of patient position throughout IMRT and 3D treatments without excessive radiation exposure.  I am ordering optical surface tracking for this patient's upcoming course of radiotherapy. ________________________________  Marye Round, MD 09/14/2013 3:43 PM    Reference:   Particia Jasper, et al. Surface imaging-based analysis of intrafraction motion for breast radiotherapy patients.Journal of [place], n. 6, nov. 2014. ISSN [id number].   Available at: <http://www.jacmp.org/index.php/jacmp/article/view/4957>.

## 2013-09-17 ENCOUNTER — Telehealth: Payer: Self-pay | Admitting: *Deleted

## 2013-09-17 NOTE — Telephone Encounter (Signed)
CALLED PATIENT TO ASK ABOUT COMING FOR STAT LABS TOMORROW, PATIENT AGREED TO COME IN @ 8:30 AM TOMORROW.

## 2013-09-18 ENCOUNTER — Encounter (HOSPITAL_COMMUNITY): Payer: Self-pay

## 2013-09-18 ENCOUNTER — Ambulatory Visit (HOSPITAL_COMMUNITY)
Admission: RE | Admit: 2013-09-18 | Discharge: 2013-09-18 | Disposition: A | Discharge status: Home / Self Care | Payer: 59 | Source: Ambulatory Visit | Attending: Radiation Oncology | Admitting: Radiation Oncology

## 2013-09-18 ENCOUNTER — Ambulatory Visit (HOSPITAL_COMMUNITY): Payer: 59

## 2013-09-18 ENCOUNTER — Ambulatory Visit
Admission: RE | Admit: 2013-09-18 | Discharge: 2013-09-18 | Disposition: A | Discharge status: Home / Self Care | Payer: 59 | Source: Ambulatory Visit | Attending: Radiation Oncology | Admitting: Radiation Oncology

## 2013-09-18 DIAGNOSIS — C50119 Malignant neoplasm of central portion of unspecified female breast: Secondary | ICD-10-CM | POA: Insufficient documentation

## 2013-09-18 DIAGNOSIS — C50919 Malignant neoplasm of unspecified site of unspecified female breast: Secondary | ICD-10-CM

## 2013-09-18 DIAGNOSIS — R599 Enlarged lymph nodes, unspecified: Secondary | ICD-10-CM | POA: Insufficient documentation

## 2013-09-18 LAB — BUN AND CREATININE (CC13)
BUN: 17.9 mg/dL (ref 7.0–26.0)
CREATININE: 0.9 mg/dL (ref 0.6–1.1)

## 2013-09-18 MED ORDER — IOHEXOL 300 MG/ML  SOLN
100.0000 mL | Freq: Once | INTRAMUSCULAR | Status: AC | PRN
  Administered 2013-09-18: 100 mL via INTRAVENOUS

## 2013-09-21 ENCOUNTER — Ambulatory Visit: Payer: 59 | Admitting: Radiation Oncology

## 2013-09-21 ENCOUNTER — Telehealth: Payer: Self-pay | Admitting: *Deleted

## 2013-09-21 NOTE — Telephone Encounter (Signed)
Called pt to schedule her with Mendel Ryder while Dr. Humphrey Rolls on LOA.  Pt relate she does not want to come in at this time and to cancel her appt.  Pt informed she will call back to come in for f/u.  Physician team notified.

## 2013-09-24 ENCOUNTER — Ambulatory Visit
Admission: RE | Admit: 2013-09-24 | Discharge: 2013-09-24 | Disposition: A | Discharge status: Home / Self Care | Payer: 59 | Source: Ambulatory Visit | Attending: Radiation Oncology | Admitting: Radiation Oncology

## 2013-09-24 ENCOUNTER — Encounter: Payer: Self-pay | Admitting: Radiation Oncology

## 2013-09-24 DIAGNOSIS — C50919 Malignant neoplasm of unspecified site of unspecified female breast: Secondary | ICD-10-CM

## 2013-09-24 MED ORDER — ALRA NON-METALLIC DEODORANT (RAD-ONC)
1.0000 "application " | Freq: Once | TOPICAL | Status: AC
  Administered 2013-09-24: 1 via TOPICAL

## 2013-09-24 MED ORDER — RADIAPLEXRX EX GEL
Freq: Once | CUTANEOUS | Status: AC
  Administered 2013-09-24: 11:00:00 via TOPICAL

## 2013-09-24 NOTE — Progress Notes (Signed)
Patient education done, rad book, alra, radiaplex gel, skin product flyer, business card, printed schedule discussed pain, fatigue, skin irritation, stay hydrated, increase protein in diet, teach back given, all question answered 10:51 AM

## 2013-09-25 ENCOUNTER — Ambulatory Visit: Payer: 59

## 2013-09-26 ENCOUNTER — Ambulatory Visit
Admission: RE | Admit: 2013-09-26 | Discharge: 2013-09-26 | Disposition: A | Discharge status: Home / Self Care | Payer: 59 | Source: Ambulatory Visit | Attending: Radiation Oncology | Admitting: Radiation Oncology

## 2013-09-27 ENCOUNTER — Ambulatory Visit
Admission: RE | Admit: 2013-09-27 | Discharge: 2013-09-27 | Disposition: A | Discharge status: Home / Self Care | Payer: 59 | Source: Ambulatory Visit | Attending: Radiation Oncology | Admitting: Radiation Oncology

## 2013-09-28 ENCOUNTER — Ambulatory Visit
Admission: RE | Admit: 2013-09-28 | Discharge: 2013-09-28 | Disposition: A | Discharge status: Home / Self Care | Payer: 59 | Source: Ambulatory Visit | Attending: Radiation Oncology | Admitting: Radiation Oncology

## 2013-09-28 VITALS — BP 134/79 | HR 67 | Temp 97.8°F | Ht 65.0 in | Wt 160.0 lb

## 2013-09-28 DIAGNOSIS — C50119 Malignant neoplasm of central portion of unspecified female breast: Secondary | ICD-10-CM

## 2013-09-28 NOTE — Progress Notes (Signed)
[name] has had 3 fractions to her right chest wall and subclavian area.  She denies pain.  She has noticed some swelling by her right underarm/right chest.  She is using radiaplex.  She is fatigued from working 12 hours yesterday.

## 2013-09-28 NOTE — Progress Notes (Signed)
   Department of Radiation Oncology  Phone:  [phone] Fax:        [phone]  Weekly Treatment Note    Name: [name] Date: 09/28/2013 MRN: [id number] DOB: Aug 11, 1955   Current dose: 5.4 Gy  Current fraction: 3   MEDICATIONS: Current Outpatient Prescriptions  Medication Sig Dispense Refill  . hyaluronate sodium (RADIAPLEXRX) GEL Apply 1 application topically 2 (two) times daily. Apply to affected skin area after rad tx and bedtime daily and on weekends      . Multiple Vitamins-Minerals (MULTIVITAMIN PO) Take by mouth daily.      . non-metallic deodorant Jethro Poling) MISC Apply 1 application topically daily.      Marland Kitchen OVER THE COUNTER MEDICATION Peptids supplements      . UNABLE TO FIND W4462 Silicone Breast Prosthesis, Rt quantity 1 L8020 Mastectomy form, Rt, quantity 1 L8000 Mastectomy bra, quantity 6       No current facility-administered medications for this encounter.     ALLERGIES: Penicillins   LABORATORY DATA:  Lab Results  Component Value Date   WBC 4.5 03/26/2013   HGB 13.4 03/26/2013   HCT 40.5 03/26/2013   MCV 85.1 03/26/2013   PLT 330 03/26/2013   Lab Results  Component Value Date   NA 142 03/26/2013   K 4.2 03/26/2013   CL 105 11/08/2012   CO2 28 03/26/2013   Lab Results  Component Value Date   ALT 30 03/26/2013   AST 26 03/26/2013   ALKPHOS 107 03/26/2013   BILITOT 0.46 03/26/2013     NARRATIVE: [name] was seen today for weekly treatment management. The chart was checked and the patient's films were reviewed. The patient is doing well. No difficulties with treatment. The patient indicates that she noticed some swelling in the chest beginning yesterday.  PHYSICAL EXAMINATION: height is 5\' 5"  (1.651 m) and weight is 160 lb (72.576 kg). Her oral temperature is 97.8 F (36.6 C). Her blood pressure is 134/79 and her pulse is 67.      some mild swelling is possibly present in the upper chest laterally. No dramatic difference. We will  continue to follow this.  ASSESSMENT: The patient is doing satisfactorily with treatment.  PLAN: We will continue with the patient's radiation treatment as planned.

## 2013-10-01 ENCOUNTER — Ambulatory Visit: Admission: RE | Admit: 2013-10-01 | Payer: 59 | Source: Ambulatory Visit | Admitting: Radiation Oncology

## 2013-10-01 ENCOUNTER — Telehealth: Payer: Self-pay | Admitting: *Deleted

## 2013-10-01 ENCOUNTER — Ambulatory Visit: Payer: 59 | Admitting: Oncology

## 2013-10-01 ENCOUNTER — Ambulatory Visit
Admission: RE | Admit: 2013-10-01 | Discharge: 2013-10-01 | Disposition: A | Discharge status: Home / Self Care | Payer: 59 | Source: Ambulatory Visit | Attending: Radiation Oncology | Admitting: Radiation Oncology

## 2013-10-01 ENCOUNTER — Other Ambulatory Visit: Payer: 59

## 2013-10-01 NOTE — Telephone Encounter (Signed)
Patient called back and cancelled today to see MD said swelling in face has gone down no redness after she had used an ice pack, 2:31 PM

## 2013-10-01 NOTE — Telephone Encounter (Signed)
Returned call to patient who had left message on phone that after rad tx when she gopt home her face was swollen and red, asked if she was short of breath "No, no fever,"I feel hot though, this swelling is new", should I come in to see the Dr"? , patient instructed to come in , she will be her at 3pm ,if continued selling go to the ED first 1:17 PM

## 2013-10-01 NOTE — Telephone Encounter (Signed)
Pt to be here at 3pm 1:22 PM

## 2013-10-02 ENCOUNTER — Ambulatory Visit
Admission: RE | Admit: 2013-10-02 | Discharge: 2013-10-02 | Disposition: A | Discharge status: Home / Self Care | Payer: 59 | Source: Ambulatory Visit | Attending: Radiation Oncology | Admitting: Radiation Oncology

## 2013-10-03 ENCOUNTER — Ambulatory Visit
Admission: RE | Admit: 2013-10-03 | Discharge: 2013-10-03 | Disposition: A | Discharge status: Home / Self Care | Payer: 59 | Source: Ambulatory Visit | Attending: Radiation Oncology | Admitting: Radiation Oncology

## 2013-10-04 ENCOUNTER — Ambulatory Visit
Admission: RE | Admit: 2013-10-04 | Discharge: 2013-10-04 | Disposition: A | Discharge status: Home / Self Care | Payer: 59 | Source: Ambulatory Visit | Attending: Radiation Oncology | Admitting: Radiation Oncology

## 2013-10-05 ENCOUNTER — Ambulatory Visit
Admission: RE | Admit: 2013-10-05 | Discharge: 2013-10-05 | Disposition: A | Discharge status: Home / Self Care | Payer: 59 | Source: Ambulatory Visit | Attending: Radiation Oncology | Admitting: Radiation Oncology

## 2013-10-05 VITALS — BP 143/85 | HR 66 | Temp 98.2°F | Ht 65.0 in | Wt 160.0 lb

## 2013-10-05 DIAGNOSIS — C50119 Malignant neoplasm of central portion of unspecified female breast: Secondary | ICD-10-CM

## 2013-10-05 NOTE — Progress Notes (Signed)
   Department of Radiation Oncology  Phone:  [phone] Fax:        [phone]  Weekly Treatment Note    Name: [name] Date: 10/05/2013 MRN: [id number] DOB: [birth year]   Current dose: 14.4 Gy  Current fraction:8   MEDICATIONS: Current Outpatient Prescriptions  Medication Sig Dispense Refill  . hyaluronate sodium (RADIAPLEXRX) GEL Apply 1 application topically 2 (two) times daily. Apply to affected skin area after rad tx and bedtime daily and on weekends      . non-metallic deodorant (ALRA) MISC Apply 1 application topically daily.      Marland Kitchen UNABLE TO FIND R4431 Silicone Breast Prosthesis, Rt quantity 1 L8020 Mastectomy form, Rt, quantity 1 L8000 Mastectomy bra, quantity 6      . Multiple Vitamins-Minerals (MULTIVITAMIN PO) Take by mouth daily.      Marland Kitchen OVER THE COUNTER MEDICATION Peptids supplements       No current facility-administered medications for this encounter.     ALLERGIES: Penicillins   LABORATORY DATA:  Lab Results  Component Value Date   WBC 4.5 03/26/2013   HGB 13.4 03/26/2013   HCT 40.5 03/26/2013   MCV 85.1 03/26/2013   PLT 330 03/26/2013   Lab Results  Component Value Date   NA 142 03/26/2013   K 4.2 03/26/2013   CL 105 11/08/2012   CO2 28 03/26/2013   Lab Results  Component Value Date   ALT 30 03/26/2013   AST 26 03/26/2013   ALKPHOS 107 03/26/2013   BILITOT 0.46 03/26/2013     NARRATIVE: [name] was seen today for weekly treatment management. The chart was checked and the patient's films were reviewed. The patient is doing well this week. She feels the swelling in the chest has improved. This was mild to moderate on exam last week. She is using skin cream daily.  PHYSICAL EXAMINATION: height is 5\' 5"  (1.651 m) and weight is 160 lb (72.576 kg). Her oral temperature is 98.2 F (36.8 C). Her blood pressure is 143/85 and her pulse is 66.      minimal skin change at this point with some slight color change.  ASSESSMENT: The  patient is doing satisfactorily with treatment.  PLAN: We will continue with the patient's radiation treatment as planned.

## 2013-10-05 NOTE — Progress Notes (Signed)
[name] has had 8 fractions to her right chestwall and subclavian area.  She denies pain and fatigue.  The skin on her right chest wall is intact with slight hyperpigmentation.  She is using radiaplex gel twice a day.

## 2013-10-09 ENCOUNTER — Ambulatory Visit
Admission: RE | Admit: 2013-10-09 | Discharge: 2013-10-09 | Disposition: A | Discharge status: Home / Self Care | Payer: 59 | Source: Ambulatory Visit | Attending: Radiation Oncology | Admitting: Radiation Oncology

## 2013-10-10 ENCOUNTER — Ambulatory Visit
Admission: RE | Admit: 2013-10-10 | Discharge: 2013-10-10 | Disposition: A | Discharge status: Home / Self Care | Payer: 59 | Source: Ambulatory Visit | Attending: Radiation Oncology | Admitting: Radiation Oncology

## 2013-10-11 ENCOUNTER — Ambulatory Visit
Admission: RE | Admit: 2013-10-11 | Discharge: 2013-10-11 | Disposition: A | Discharge status: Home / Self Care | Payer: 59 | Source: Ambulatory Visit | Attending: Radiation Oncology | Admitting: Radiation Oncology

## 2013-10-12 ENCOUNTER — Ambulatory Visit
Admission: RE | Admit: 2013-10-12 | Discharge: 2013-10-12 | Disposition: A | Discharge status: Home / Self Care | Payer: 59 | Source: Ambulatory Visit | Attending: Radiation Oncology | Admitting: Radiation Oncology

## 2013-10-12 DIAGNOSIS — C50119 Malignant neoplasm of central portion of unspecified female breast: Secondary | ICD-10-CM

## 2013-10-12 NOTE — Progress Notes (Signed)
MD saw patient in the back per [place] therapist, not sent to nursing for assessment, she had to get to work" 11:08 AM

## 2013-10-12 NOTE — Progress Notes (Signed)
   Department of Radiation Oncology  Phone:  [phone] Fax:        [phone]  Weekly Treatment Note    Name: [name] Date: 10/12/2013 MRN: [id number] DOB: 1955-05-29   Current fraction: 12   MEDICATIONS: Current Outpatient Prescriptions  Medication Sig Dispense Refill  . hyaluronate sodium (RADIAPLEXRX) GEL Apply 1 application topically 2 (two) times daily. Apply to affected skin area after rad tx and bedtime daily and on weekends      . Multiple Vitamins-Minerals (MULTIVITAMIN PO) Take by mouth daily.      . non-metallic deodorant Jethro Poling) MISC Apply 1 application topically daily.      Marland Kitchen OVER THE COUNTER MEDICATION Peptids supplements      . UNABLE TO FIND D1497 Silicone Breast Prosthesis, Rt quantity 1 L8020 Mastectomy form, Rt, quantity 1 L8000 Mastectomy bra, quantity 6       No current facility-administered medications for this encounter.     ALLERGIES: Penicillins   LABORATORY DATA:  Lab Results  Component Value Date   WBC 4.5 03/26/2013   HGB 13.4 03/26/2013   HCT 40.5 03/26/2013   MCV 85.1 03/26/2013   PLT 330 03/26/2013   Lab Results  Component Value Date   NA 142 03/26/2013   K 4.2 03/26/2013   CL 105 11/08/2012   CO2 28 03/26/2013   Lab Results  Component Value Date   ALT 30 03/26/2013   AST 26 03/26/2013   ALKPHOS 107 03/26/2013   BILITOT 0.46 03/26/2013     NARRATIVE: [name] was seen today for weekly treatment management. The chart was checked and the patient's films were reviewed. The patient is doing very well today. She notes minimal change in her skin. No new complaints.  PHYSICAL EXAMINATION:    mild erythema in the treatment area. Overall her skin looks are good.  ASSESSMENT: The patient is doing satisfactorily with treatment.  PLAN: We will continue with the patient's radiation treatment as planned.

## 2013-10-15 ENCOUNTER — Ambulatory Visit
Admission: RE | Admit: 2013-10-15 | Discharge: 2013-10-15 | Disposition: A | Discharge status: Home / Self Care | Payer: 59 | Source: Ambulatory Visit | Attending: Radiation Oncology | Admitting: Radiation Oncology

## 2013-10-16 ENCOUNTER — Ambulatory Visit
Admission: RE | Admit: 2013-10-16 | Discharge: 2013-10-16 | Disposition: A | Discharge status: Home / Self Care | Payer: 59 | Source: Ambulatory Visit | Attending: Radiation Oncology | Admitting: Radiation Oncology

## 2013-10-17 ENCOUNTER — Ambulatory Visit
Admission: RE | Admit: 2013-10-17 | Discharge: 2013-10-17 | Disposition: A | Discharge status: Home / Self Care | Payer: 59 | Source: Ambulatory Visit | Attending: Radiation Oncology | Admitting: Radiation Oncology

## 2013-10-18 ENCOUNTER — Ambulatory Visit
Admission: RE | Admit: 2013-10-18 | Discharge: 2013-10-18 | Disposition: A | Discharge status: Home / Self Care | Payer: 59 | Source: Ambulatory Visit | Attending: Radiation Oncology | Admitting: Radiation Oncology

## 2013-10-19 ENCOUNTER — Encounter: Payer: Self-pay | Admitting: Radiation Oncology

## 2013-10-19 ENCOUNTER — Ambulatory Visit
Admission: RE | Admit: 2013-10-19 | Discharge: 2013-10-19 | Disposition: A | Discharge status: Home / Self Care | Payer: 59 | Source: Ambulatory Visit | Attending: Radiation Oncology | Admitting: Radiation Oncology

## 2013-10-19 VITALS — BP 127/79 | HR 85 | Temp 97.9°F | Resp 20 | Wt 158.3 lb

## 2013-10-19 DIAGNOSIS — C50119 Malignant neoplasm of central portion of unspecified female breast: Secondary | ICD-10-CM

## 2013-10-19 NOTE — Progress Notes (Signed)
   Department of Radiation Oncology  Phone:  [phone] Fax:        [phone]  Weekly Treatment Note    Name: [name] Date: 10/19/2013 MRN: [id number] DOB: Mar 08, 1956   Current dose: 30.6 Gy  Current fraction: 17   MEDICATIONS: Current Outpatient Prescriptions  Medication Sig Dispense Refill  . emollient (BIAFINE) cream Apply 1 application topically 2 (two) times daily. Apply to rt cw area after rad and bedtome daily      . Multiple Vitamins-Minerals (MULTIVITAMIN PO) Take by mouth daily.      . non-metallic deodorant Jethro Poling) MISC Apply 1 application topically daily.      Marland Kitchen OVER THE COUNTER MEDICATION Peptids supplements      . UNABLE TO FIND A8341 Silicone Breast Prosthesis, Rt quantity 1 L8020 Mastectomy form, Rt, quantity 1 L8000 Mastectomy bra, quantity 6      . hyaluronate sodium (RADIAPLEXRX) GEL Apply 1 application topically 2 (two) times daily. Apply to affected skin area after rad tx and bedtime daily and on weekends       No current facility-administered medications for this encounter.     ALLERGIES: Penicillins   LABORATORY DATA:  Lab Results  Component Value Date   WBC 4.5 03/26/2013   HGB 13.4 03/26/2013   HCT 40.5 03/26/2013   MCV 85.1 03/26/2013   PLT 330 03/26/2013   Lab Results  Component Value Date   NA 142 03/26/2013   K 4.2 03/26/2013   CL 105 11/08/2012   CO2 28 03/26/2013   Lab Results  Component Value Date   ALT 30 03/26/2013   AST 26 03/26/2013   ALKPHOS 107 03/26/2013   BILITOT 0.46 03/26/2013     NARRATIVE: [name] was seen today for weekly treatment management. The chart was checked and the patient's films were reviewed. The patient complains of some increased skin irritation. She describes some burning and itching in the treatment area.  PHYSICAL EXAMINATION: weight is 158 lb 4.8 oz (71.804 kg). Her oral temperature is 97.9 F (36.6 C). Her blood pressure is 127/79 and her pulse is 85. Her respiration is 20.       her skin looks good at this point with some emerging erythema and more prominent areas of hyperpigmentation and, especially in the supraclavicular region. Dermatitis present.  ASSESSMENT: The patient is doing satisfactorily with treatment.  PLAN: We will continue with the patient's radiation treatment as planned. The patient will begin using Biafine.

## 2013-10-19 NOTE — Addendum Note (Signed)
Encounter addended by: Rebecca Eaton, RN on: 10/19/2013 10:58 AM<BR>     Documentation filed: Inpatient Document Flowsheet

## 2013-10-19 NOTE — Progress Notes (Signed)
Weekly rad txs rt cw 17 txsc, skin dermatitis on chest, erythema supraclavical area , c/o pain  Burning and itching skin area,  Gave biafine cream  To start using,  10:06 AM

## 2013-10-22 ENCOUNTER — Ambulatory Visit
Admission: RE | Admit: 2013-10-22 | Discharge: 2013-10-22 | Disposition: A | Discharge status: Home / Self Care | Payer: 59 | Source: Ambulatory Visit | Attending: Radiation Oncology | Admitting: Radiation Oncology

## 2013-10-23 ENCOUNTER — Ambulatory Visit
Admission: RE | Admit: 2013-10-23 | Discharge: 2013-10-23 | Disposition: A | Discharge status: Home / Self Care | Payer: 59 | Source: Ambulatory Visit | Attending: Radiation Oncology | Admitting: Radiation Oncology

## 2013-10-24 ENCOUNTER — Ambulatory Visit
Admission: RE | Admit: 2013-10-24 | Discharge: 2013-10-24 | Disposition: A | Discharge status: Home / Self Care | Payer: 59 | Source: Ambulatory Visit | Attending: Radiation Oncology | Admitting: Radiation Oncology

## 2013-10-25 ENCOUNTER — Ambulatory Visit
Admission: RE | Admit: 2013-10-25 | Discharge: 2013-10-25 | Disposition: A | Discharge status: Home / Self Care | Payer: 59 | Source: Ambulatory Visit | Attending: Radiation Oncology | Admitting: Radiation Oncology

## 2013-10-26 ENCOUNTER — Ambulatory Visit
Admission: RE | Admit: 2013-10-26 | Discharge: 2013-10-26 | Disposition: A | Discharge status: Home / Self Care | Payer: 59 | Source: Ambulatory Visit | Attending: Radiation Oncology | Admitting: Radiation Oncology

## 2013-10-26 ENCOUNTER — Encounter: Payer: Self-pay | Admitting: Radiation Oncology

## 2013-10-26 DIAGNOSIS — C50119 Malignant neoplasm of central portion of unspecified female breast: Secondary | ICD-10-CM

## 2013-10-26 NOTE — Progress Notes (Signed)
   Department of Radiation Oncology  Phone:  [phone] Fax:        [phone]  Weekly Treatment Note    Name: CAREL SCHNEE Date: 10/26/2013 MRN: [id number] DOB: [birth year]    Current fraction: 22   MEDICATIONS: Current Outpatient Prescriptions  Medication Sig Dispense Refill  . emollient (BIAFINE) cream Apply 1 application topically 2 (two) times daily. Apply to rt cw area after rad and bedtome daily      . hyaluronate sodium (RADIAPLEXRX) GEL Apply 1 application topically 2 (two) times daily. Apply to affected skin area after rad tx and bedtime daily and on weekends      . Multiple Vitamins-Minerals (MULTIVITAMIN PO) Take by mouth daily.      . non-metallic deodorant Jethro Poling) MISC Apply 1 application topically daily.      Marland Kitchen OVER THE COUNTER MEDICATION Peptids supplements      . UNABLE TO FIND H3716 Silicone Breast Prosthesis, Rt quantity 1 L8020 Mastectomy form, Rt, quantity 1 L8000 Mastectomy bra, quantity 6       No current facility-administered medications for this encounter.     ALLERGIES: Penicillins   LABORATORY DATA:  Lab Results  Component Value Date   WBC 4.5 03/26/2013   HGB 13.4 03/26/2013   HCT 40.5 03/26/2013   MCV 85.1 03/26/2013   PLT 330 03/26/2013   Lab Results  Component Value Date   NA 142 03/26/2013   K 4.2 03/26/2013   CL 105 11/08/2012   CO2 28 03/26/2013   Lab Results  Component Value Date   ALT 30 03/26/2013   AST 26 03/26/2013   ALKPHOS 107 03/26/2013   BILITOT 0.46 03/26/2013     NARRATIVE: [name] was seen today for weekly treatment management. The chart was checked and the patient's films were reviewed. The patient is doing very well. We outlined the area for her upcoming boost treatment today. The patient's skin looks quite good. She complains of some irritative symptoms in the upper chest.  PHYSICAL EXAMINATION:   The patient's skin looks quite good today. Hyperpigmentation present, most notably in the upper  medial aspect of the chest wall.  ASSESSMENT: The patient is doing satisfactorily with treatment.  PLAN: We will continue with the patient's radiation treatment as planned.

## 2013-10-26 NOTE — Progress Notes (Signed)
MD saw patient in the back [facility]#L3, not sent to nursing for assessment, per [facility] Rad RT 11:33 AM

## 2013-10-29 ENCOUNTER — Ambulatory Visit
Admission: RE | Admit: 2013-10-29 | Discharge: 2013-10-29 | Disposition: A | Discharge status: Home / Self Care | Payer: 59 | Source: Ambulatory Visit | Attending: Radiation Oncology | Admitting: Radiation Oncology

## 2013-10-30 ENCOUNTER — Ambulatory Visit
Admission: RE | Admit: 2013-10-30 | Discharge: 2013-10-30 | Disposition: A | Discharge status: Home / Self Care | Payer: 59 | Source: Ambulatory Visit | Attending: Radiation Oncology | Admitting: Radiation Oncology

## 2013-10-31 ENCOUNTER — Encounter: Payer: Self-pay | Admitting: Radiation Oncology

## 2013-10-31 ENCOUNTER — Ambulatory Visit
Admission: RE | Admit: 2013-10-31 | Discharge: 2013-10-31 | Disposition: A | Discharge status: Home / Self Care | Payer: 59 | Source: Ambulatory Visit | Attending: Radiation Oncology | Admitting: Radiation Oncology

## 2013-10-31 NOTE — Progress Notes (Signed)
  Radiation Oncology         [phone] ________________________________  Name: [name] MRN: [id number]  Date: 11/01/2013  DOB: [birth year]  Weekly Radiation Therapy Management  Current Dose: 46.8 Gy     Planned Dose:  60.4 Gy  Narrative . . . . . . . . The patient presents for routine under treatment assessment.                                   The patient is without complaint.                                 Set-up films were reviewed.                                 The chart was checked. Physical Findings. . .  weight is 159 lb 3.2 oz (72.213 kg). Her oral temperature is 98.2 F (36.8 C). Her blood pressure is 119/62 and her pulse is 73. Her respiration is 20. . Weight essentially stable.  No significant changes. Impression . . . . . . . The patient is tolerating radiation. Plan . . . . . . . . . . . . Continue treatment as planned.  ________________________________  Sheral Apley. Tammi Klippel, M.D.

## 2013-11-01 ENCOUNTER — Ambulatory Visit
Admission: RE | Admit: 2013-11-01 | Discharge: 2013-11-01 | Disposition: A | Discharge status: Home / Self Care | Payer: 59 | Source: Ambulatory Visit | Attending: Radiation Oncology | Admitting: Radiation Oncology

## 2013-11-01 ENCOUNTER — Other Ambulatory Visit (INDEPENDENT_AMBULATORY_CARE_PROVIDER_SITE_OTHER): Payer: Self-pay

## 2013-11-01 ENCOUNTER — Encounter: Payer: Self-pay | Admitting: Radiation Oncology

## 2013-11-01 VITALS — BP 119/62 | HR 73 | Temp 98.2°F | Resp 20 | Wt 159.2 lb

## 2013-11-01 DIAGNOSIS — C50119 Malignant neoplasm of central portion of unspecified female breast: Secondary | ICD-10-CM

## 2013-11-01 MED ORDER — UNABLE TO FIND: Status: DC

## 2013-11-01 NOTE — Progress Notes (Signed)
Weekly rad txs rt cw and subclav area, erythema, dry desquamation,  Tenderness  Using biafine cream bid, appetite fair, no pain,  No nausea,  10:49 AM

## 2013-11-02 ENCOUNTER — Encounter (INDEPENDENT_AMBULATORY_CARE_PROVIDER_SITE_OTHER): Payer: Commercial Managed Care - PPO | Admitting: General Surgery

## 2013-11-02 ENCOUNTER — Ambulatory Visit
Admission: RE | Admit: 2013-11-02 | Discharge: 2013-11-02 | Disposition: A | Discharge status: Home / Self Care | Payer: 59 | Source: Ambulatory Visit | Attending: Radiation Oncology | Admitting: Radiation Oncology

## 2013-11-05 ENCOUNTER — Ambulatory Visit
Admission: RE | Admit: 2013-11-05 | Discharge: 2013-11-05 | Disposition: A | Discharge status: Home / Self Care | Payer: 59 | Source: Ambulatory Visit | Attending: Radiation Oncology | Admitting: Radiation Oncology

## 2013-11-06 ENCOUNTER — Ambulatory Visit: Payer: 59

## 2013-11-06 ENCOUNTER — Ambulatory Visit
Admission: RE | Admit: 2013-11-06 | Discharge: 2013-11-06 | Disposition: A | Discharge status: Home / Self Care | Payer: 59 | Source: Ambulatory Visit | Attending: Radiation Oncology | Admitting: Radiation Oncology

## 2013-11-07 ENCOUNTER — Ambulatory Visit
Admission: RE | Admit: 2013-11-07 | Discharge: 2013-11-07 | Disposition: A | Discharge status: Home / Self Care | Payer: 59 | Source: Ambulatory Visit | Attending: Radiation Oncology | Admitting: Radiation Oncology

## 2013-11-08 ENCOUNTER — Ambulatory Visit: Payer: 59

## 2013-11-08 ENCOUNTER — Ambulatory Visit
Admission: RE | Admit: 2013-11-08 | Discharge: 2013-11-08 | Disposition: A | Discharge status: Home / Self Care | Payer: 59 | Source: Ambulatory Visit | Attending: Radiation Oncology | Admitting: Radiation Oncology

## 2013-11-08 ENCOUNTER — Encounter: Payer: Self-pay | Admitting: Radiation Oncology

## 2013-11-08 VITALS — BP 118/69 | HR 83 | Temp 98.8°F | Resp 20 | Wt 159.0 lb

## 2013-11-08 DIAGNOSIS — C50111 Malignant neoplasm of central portion of right female breast: Secondary | ICD-10-CM

## 2013-11-08 NOTE — Progress Notes (Signed)
Patient denies pain, fatigue, loss of appetite. She states she was fatigued until she began watching her diet. Pt is applying Biafine to right chest wall. She denies itching. Advised she continue to apply x 2-3 weeks then apply lotion with vit E. Gave pt 1 month FU card.

## 2013-11-08 NOTE — Progress Notes (Signed)
  Department of Radiation Oncology  Phone:  [phone] Fax:        [phone]  Weekly Treatment Note    Name: [name] Date: 11/08/2013 MRN: [id number] DOB: Jul 07, 1955   Current dose: 60.4 Gy  Current fraction: 33   MEDICATIONS: Current Outpatient Prescriptions  Medication Sig Dispense Refill  . emollient (BIAFINE) cream Apply 1 application topically 2 (two) times daily. Apply to rt cw area after rad and bedtome daily      . hyaluronate sodium (RADIAPLEXRX) GEL Apply 1 application topically 2 (two) times daily. Apply to affected skin area after rad tx and bedtime daily and on weekends      . Multiple Vitamins-Minerals (MULTIVITAMIN PO) Take by mouth daily.      . non-metallic deodorant Jethro Poling) MISC Apply 1 application topically daily.      Marland Kitchen OVER THE COUNTER MEDICATION Peptids supplements      . UNABLE TO FIND O9735 Silicone Breast Prosthesis, Rt quantity 1 L8020 Mastectomy form, Rt, quantity 1 L8000 Mastectomy bra, quantity 6      . UNABLE TO FIND 174.9 Malignant Neoplasm   Mastectomy Right  L8030-Silicone Breast Prosthesis-1  L8000- Mastectomy Bra-2  1 each  0   No current facility-administered medications for this encounter.     ALLERGIES: Penicillins   LABORATORY DATA:  Lab Results  Component Value Date   WBC 4.5 03/26/2013   HGB 13.4 03/26/2013   HCT 40.5 03/26/2013   MCV 85.1 03/26/2013   PLT 330 03/26/2013   Lab Results  Component Value Date   NA 142 03/26/2013   K 4.2 03/26/2013   CL 105 11/08/2012   CO2 28 03/26/2013   Lab Results  Component Value Date   ALT 30 03/26/2013   AST 26 03/26/2013   ALKPHOS 107 03/26/2013   BILITOT 0.46 03/26/2013     NARRATIVE: [name] was seen today for weekly treatment management. The chart was checked and the patient's films were reviewed. The patient states she is done well in her final week of treatment. She denies any pain or tiredness. She has continued to use skin cream on a daily  basis.  PHYSICAL EXAMINATION: weight is 159 lb (72.122 kg). Her temperature is 98.8 F (37.1 C). Her blood pressure is 118/69 and her pulse is 83. Her respiration is 20.      some erythema and hyperpigmentation along with some radiation dermatitis in the upper inner portion of the treatment area. No moist desquamation. Overall her skin looks quite good for finishing treatment.  ASSESSMENT: The patient did satisfactorily with treatment.  PLAN: The patient will follow-up in our clinic in 1 month.

## 2013-11-09 ENCOUNTER — Ambulatory Visit: Payer: 59

## 2013-11-12 ENCOUNTER — Ambulatory Visit: Payer: 59

## 2013-11-19 NOTE — Progress Notes (Signed)
  Radiation Oncology         [phone] ________________________________  Name: [name] MRN: [id number]  Date: 11/08/2013  DOB: 05-05-1956  End of Treatment Note  Diagnosis:   Right-sided breast cancer     Indication for treatment:  Curative       Radiation treatment dates:   09/26/2013 through 11/08/2013  Site/dose:   The patient was treated to the right chest wall and right supraclavicular region to a dose of 50.4 gray at 1.8 gray per fraction. A forward planning technique was used for a total of 6 customized fields. This consisted of a 3-D conformal technique. The patient then received a 10 gray boost to the mastectomy scar using a en face electron field, 6 MeV electrons. The patient's total dose was 60.4 gray.  Narrative: The patient tolerated radiation treatment relatively well.   The patient experienced some moderate skin irritation by the end of treatment. Overall her skin held up excellent during her course of radiation treatment.  Plan: The patient has completed radiation treatment. The patient will return to radiation oncology clinic for routine followup in one month. I advised the patient to call or return sooner if they have any questions or concerns related to their recovery or treatment. ________________________________  Jodelle Gross, M.D., Ph.D.

## 2013-11-19 NOTE — Progress Notes (Signed)
  Radiation Oncology         [phone] ________________________________  Name: [name] MRN: [id number]  Date: 10/26/2013  DOB: 25-Aug-1955  Complex simulation note  The patient has undergone complex simulation for her upcoming boost treatment for her diagnosis of breast cancer. The patient has initially been planned to receive 50.4 Gy. The patient will now receive a  10 Gy boost to the mastectomy scar which has been identified. This will be accomplished using an en face electron field. Based on the depth of the target area, 6 MeV electrons will be used and this field has been normalized to the 90 % isodose line. The patient's final total dose therefore will be 60.4 Gy. A special port plan is requested for the boost treatment.   _______________________________  Jodelle Gross, MD, PhD

## 2013-11-19 NOTE — Progress Notes (Signed)
  Radiation Oncology         [phone] ________________________________  Name: [name] MRN: [id number]  Date: 09/24/2013  DOB: 13-Oct-1955  Simulation Verification Note   NARRATIVE: The patient was brought to the treatment unit and placed in the planned treatment position. The clinical setup was verified. Then port films were obtained and uploaded to the radiation oncology medical record software.  The treatment beams were carefully compared against the planned radiation fields. The position, location, and shape of the radiation fields was reviewed. The targeted volume of tissue appears to be appropriately covered by the radiation beams. Based on my personal review, I approved the simulation verification. The patient's treatment will proceed as planned.  ________________________________   Jodelle Gross, MD, PhD

## 2013-12-10 ENCOUNTER — Encounter (INDEPENDENT_AMBULATORY_CARE_PROVIDER_SITE_OTHER): Payer: Commercial Managed Care - PPO | Admitting: General Surgery

## 2013-12-13 ENCOUNTER — Encounter (INDEPENDENT_AMBULATORY_CARE_PROVIDER_SITE_OTHER): Payer: Self-pay | Admitting: General Surgery

## 2013-12-13 ENCOUNTER — Ambulatory Visit (INDEPENDENT_AMBULATORY_CARE_PROVIDER_SITE_OTHER): Payer: Commercial Managed Care - PPO | Admitting: General Surgery

## 2013-12-13 ENCOUNTER — Other Ambulatory Visit (INDEPENDENT_AMBULATORY_CARE_PROVIDER_SITE_OTHER): Payer: Self-pay

## 2013-12-13 VITALS — BP 118/74 | HR 83 | Temp 97.5°F | Ht 65.0 in | Wt 156.0 lb

## 2013-12-13 DIAGNOSIS — D0591 Unspecified type of carcinoma in situ of right breast: Secondary | ICD-10-CM

## 2013-12-13 DIAGNOSIS — C50911 Malignant neoplasm of unspecified site of right female breast: Secondary | ICD-10-CM

## 2013-12-13 DIAGNOSIS — C50919 Malignant neoplasm of unspecified site of unspecified female breast: Secondary | ICD-10-CM

## 2013-12-13 DIAGNOSIS — R229 Localized swelling, mass and lump, unspecified: Secondary | ICD-10-CM

## 2013-12-13 DIAGNOSIS — R2231 Localized swelling, mass and lump, right upper limb: Secondary | ICD-10-CM

## 2013-12-13 NOTE — Patient Instructions (Signed)
Your right chest wall appears to be healing well from the radiation therapy.  I do not feel any evidence of cancer  under the skin or in the lymph nodes.  You will be referred back so that you can be assigned to a new medical oncologist. I think they need to see you right away to decide any other treatment is necessary.  Return to see Dr. Dalbert Batman in 6 months, sooner if there are any problems.

## 2013-12-13 NOTE — Progress Notes (Signed)
Patient ID: [name], female   DOB: Sep 16, 1955, 58 y.o.   MRN: [id number]  History: This patient returns for followup regarding her recurrent right breast cancer. On 11/07/2012 she underwent a right modified radical mastectomy. 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 1/13 lymph nodes was positive. Receptor positive, HER-2-negative. Stage T2, N1a. .  She is BRCA2 positive. She declined chemotherapy and she declined antiestrogen therapy and was being followed by Dr. Marcy Panning. On 09/03/2013 she presented with a small palpable mass in the right axilla which I excised and revealed metastatic breast cancer, receptor positive, HER-2-negative. The case was discussed and the recommendation was to proceed with radiation therapy to the right chest wall and right axilla. Dr. Lisbeth Renshaw supervised that and that has been completed. She feels pretty good had no major complications.   She has not been reassign to a medical oncologist. Dr. Lisbeth Renshaw perform CT scan of chest abdomen and pelvis which was negative other than some tiny right axillary lymph nodes. A PET scan does not appear to have been done.  Past history, family history, social history, and review of systems have been documented chart, unchanged and noncontributory except as described above.  Exam: Patient looks well. Very flat and somewhat depressed affect no different than usual. No distress. Neck reveals no adenopathy or mass Right chest wall reveals radiation changes. Mastectomy skin flaps and right axilla are soft. I don't feel any nodules or ulcerations. Musculoskeletal reveals normal range of motion right shoulder. Gait normal Neurologic reveals no gross motor or sensory deficits.  Assessment: Recurrent breast cancer, right axilla. No evidence of recurrence following excision and chest wall radiation therapy. History Invasive carcinoma right breast, pathologic stage TII, N1 A., 13 months postop right modified radical mastectomy   BRCA2 positive  Plan:  I strongly urged her to be reassign to a medical oncologist, and she agrees. i told her  they would need to discuss antiestrogen  therapy and chemotherapy once again with her. We'll make that referral Defer scheduling of PET/CT to medical oncology Return see me in 37 months   Dora Clauss M. Dalbert Batman, M.D., [facility] General and Minimally invasive Surgery Breast and Colorectal Surgery Office:   [phone] Pager:   [phone]

## 2013-12-14 ENCOUNTER — Telehealth: Payer: Self-pay | Admitting: *Deleted

## 2013-12-14 NOTE — Telephone Encounter (Signed)
Received referral from Dr. Dalbert Batman to get pt schedule w/ new Med Onc since Dr. Humphrey Rolls is no longer here.  Called and confirmed 01/15/14 appt w/ pt.  Emailed Holley Raring and [place] at [facility] to make them aware.

## 2013-12-18 ENCOUNTER — Encounter: Payer: Self-pay | Admitting: Radiation Oncology

## 2013-12-19 ENCOUNTER — Ambulatory Visit
Admission: RE | Admit: 2013-12-19 | Discharge: 2013-12-19 | Disposition: A | Discharge status: Home / Self Care | Payer: 59 | Source: Ambulatory Visit | Attending: Radiation Oncology | Admitting: Radiation Oncology

## 2013-12-19 ENCOUNTER — Encounter: Payer: Self-pay | Admitting: Radiation Oncology

## 2013-12-19 VITALS — BP 132/82 | HR 75 | Temp 98.2°F | Resp 20 | Ht 65.0 in | Wt 155.6 lb

## 2013-12-19 DIAGNOSIS — C50111 Malignant neoplasm of central portion of right female breast: Secondary | ICD-10-CM

## 2013-12-19 HISTORY — DX: Personal history of irradiation: Z92.3

## 2013-12-19 NOTE — Progress Notes (Signed)
Follow up s/p right cw rad txs 09/26/13-11/08/13, tanning still  On chest area , has appt with Dr. Lindi Adie 01/15/14, saw Dr. Dalbert Batman  12/13/13  No c/o pain, using her own natural lotion,  Does have muscle spasms in right cchest area occasionally, appetite good, energy level good 10:55 AM

## 2013-12-19 NOTE — Progress Notes (Signed)
Radiation Oncology         [phone] ________________________________  Name: [name] MRN: [id number]  Date: 12/19/2013  DOB: [birth year]  Follow-Up Visit Note  CC: No PCP Per Patient  Adin Hector, MD  Diagnosis:   Right -sided breast cancer  Interval Since Last Radiation:  Approximately one month   Narrative:  The patient returns today for routine follow-up.  She has done well overall since she finished treatment. The patient's skin has healed significantly since she completed her course of radiation treatment. The patient saw Dr. Dalbert Batman within the last week and she is scheduled to see medical oncology in early September.  ALLERGIES:  is allergic to penicillins.  Meds: Current Outpatient Prescriptions  Medication Sig Dispense Refill  . Multiple Vitamins-Minerals (MULTIVITAMIN PO) Take by mouth daily.      Marland Kitchen OVER THE COUNTER MEDICATION Peptids supplements      . UNABLE TO FIND 174.9 Malignant Neoplasm   Mastectomy Right  L8030-Silicone Breast Prosthesis-1  L8000- Mastectomy Bra-2  1 each  0  . UNABLE TO FIND H6073 Silicone Breast Prosthesis, Rt quantity 1 L8020 Mastectomy form, Rt, quantity 1 L8000 Mastectomy bra, quantity 6       No current facility-administered medications for this encounter.    Physical Findings: The patient is in no acute distress. Patient is alert and oriented.  height is 5\' 5"  (1.651 m) and weight is 155 lb 9.6 oz (70.58 kg). Her oral temperature is 98.2 F (36.8 C). Her blood pressure is 132/82 and her pulse is 75. Her respiration is 20. .   The skin in the treatment area has healed satisfactorily, no areas of concern/moist desquamation/poor healing. Some ongoing hyperpigmentation and dryness is present in the treatment area.  Lab Findings: Lab Results  Component Value Date   WBC 4.5 03/26/2013   HGB 13.4 03/26/2013   HCT 40.5 03/26/2013   MCV 85.1 03/26/2013   PLT 330 03/26/2013     Radiographic Findings: No results  found.  Impression:    The patient has done satisfactorily since finishing treatment. The skin has healed well. We discussed having the patient is a skin moisturizer for a couple of more weeks. I believe the skin should heal very well over this time. The patient is scheduled to see medical oncology in several weeks.  Plan:  The patient will followup in our clinic on a when necessary basis.   Jodelle Gross, M.D., Ph.D.

## 2014-01-14 ENCOUNTER — Other Ambulatory Visit: Payer: Self-pay

## 2014-01-14 ENCOUNTER — Telehealth: Payer: Self-pay | Admitting: Hematology and Oncology

## 2014-01-14 DIAGNOSIS — Z9011 Acquired absence of right breast and nipple: Secondary | ICD-10-CM

## 2014-01-14 DIAGNOSIS — Z1231 Encounter for screening mammogram for malignant neoplasm of breast: Secondary | ICD-10-CM

## 2014-01-14 NOTE — Telephone Encounter (Signed)
pt called to r/s appt..done...pt aware of new d.t °

## 2014-01-15 ENCOUNTER — Ambulatory Visit: Payer: 59 | Admitting: Hematology and Oncology

## 2014-01-15 ENCOUNTER — Other Ambulatory Visit: Payer: 59

## 2014-02-01 ENCOUNTER — Other Ambulatory Visit: Payer: Self-pay

## 2014-02-01 DIAGNOSIS — C50119 Malignant neoplasm of central portion of unspecified female breast: Secondary | ICD-10-CM

## 2014-02-04 ENCOUNTER — Ambulatory Visit (HOSPITAL_BASED_OUTPATIENT_CLINIC_OR_DEPARTMENT_OTHER): Payer: 59 | Admitting: Hematology and Oncology

## 2014-02-04 ENCOUNTER — Other Ambulatory Visit (HOSPITAL_BASED_OUTPATIENT_CLINIC_OR_DEPARTMENT_OTHER): Payer: 59

## 2014-02-04 ENCOUNTER — Encounter: Payer: Self-pay | Admitting: Hematology and Oncology

## 2014-02-04 VITALS — BP 124/70 | HR 93 | Temp 98.1°F | Resp 18 | Ht 65.0 in | Wt 152.0 lb

## 2014-02-04 DIAGNOSIS — Z853 Personal history of malignant neoplasm of breast: Secondary | ICD-10-CM

## 2014-02-04 DIAGNOSIS — C50119 Malignant neoplasm of central portion of unspecified female breast: Secondary | ICD-10-CM

## 2014-02-04 DIAGNOSIS — C50111 Malignant neoplasm of central portion of right female breast: Secondary | ICD-10-CM

## 2014-02-04 LAB — CBC WITH DIFFERENTIAL/PLATELET
BASO%: 0.6 % (ref 0.0–2.0)
BASOS ABS: 0 10*3/uL (ref 0.0–0.1)
EOS ABS: 0 10*3/uL (ref 0.0–0.5)
EOS%: 0.8 % (ref 0.0–7.0)
HCT: 34.1 % — ABNORMAL LOW (ref 34.8–46.6)
HGB: 10.9 g/dL — ABNORMAL LOW (ref 11.6–15.9)
LYMPH#: 1 10*3/uL (ref 0.9–3.3)
LYMPH%: 20.5 % (ref 14.0–49.7)
MCH: 25.1 pg (ref 25.1–34.0)
MCHC: 32 g/dL (ref 31.5–36.0)
MCV: 78.4 fL — ABNORMAL LOW (ref 79.5–101.0)
MONO#: 0.3 10*3/uL (ref 0.1–0.9)
MONO%: 6.6 % (ref 0.0–14.0)
NEUT%: 71.5 % (ref 38.4–76.8)
NEUTROS ABS: 3.5 10*3/uL (ref 1.5–6.5)
Platelets: 414 10*3/uL — ABNORMAL HIGH (ref 145–400)
RBC: 4.35 10*6/uL (ref 3.70–5.45)
RDW: 15.1 % — AB (ref 11.2–14.5)
WBC: 4.9 10*3/uL (ref 3.9–10.3)

## 2014-02-04 LAB — COMPREHENSIVE METABOLIC PANEL (CC13)
ALT: 68 U/L — ABNORMAL HIGH (ref 0–55)
AST: 61 U/L — ABNORMAL HIGH (ref 5–34)
Albumin: 3.1 g/dL — ABNORMAL LOW (ref 3.5–5.0)
Alkaline Phosphatase: 286 U/L — ABNORMAL HIGH (ref 40–150)
Anion Gap: 15 mEq/L — ABNORMAL HIGH (ref 3–11)
BUN: 25 mg/dL (ref 7.0–26.0)
CALCIUM: 10.2 mg/dL (ref 8.4–10.4)
CHLORIDE: 101 meq/L (ref 98–109)
CO2: 24 meq/L (ref 22–29)
Creatinine: 0.8 mg/dL (ref 0.6–1.1)
GLUCOSE: 100 mg/dL (ref 70–140)
POTASSIUM: 4.1 meq/L (ref 3.5–5.1)
SODIUM: 140 meq/L (ref 136–145)
TOTAL PROTEIN: 8.2 g/dL (ref 6.4–8.3)
Total Bilirubin: 0.24 mg/dL (ref 0.20–1.20)

## 2014-02-04 NOTE — Assessment & Plan Note (Addendum)
Right breast cancer T2, N1, M0 stage IIB ER/PR positive HER-2 negative status post right mastectomy and radiation to right chest wall and axilla  Patient has refused adjuvant chemotherapy and antiestrogen therapy and is receiving glucose of treatment from [place] which is applied in the form of a patch that sucks out impurities from the body. Apparently this leaves a big opening in the skin that heals after one to 2 weeks. She has not been doing that once again.  Surveillance: Mammograms scheduled for March 2016. We will do a breast exam and followup at that time. Survivorship: Discussed the importance of physical exercise in decreasing the likelihood of breast cancer recurrence. Recommended 30 mins daily 6 days a week of either brisk walking or cycling or swimming. Encouraged patient to eat more fruits and vegetables and decrease red meat. Patient plans on doing stretching exercises to relieve the knots in the muscles.  I discussed with her that the muscle pain is not related to radiation and that she should focus on doing exercises and stretching to ease the symptoms

## 2014-02-04 NOTE — Progress Notes (Signed)
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 one out of 13 lymph nodes positive ER 100%, PR 29%, HER-2 negative ratio 0.93 Ki-67 89%   09/26/2013 - 11/08/2013 Radiation Therapy Radiation to right chest wall and right supraclavicular region to a dose of 50.4 gray at 1.8 gray per fraction    Anti-estrogen oral therapy Patient refused antiestrogen therapy and chemotherapy, receives Blue crystal treatment from [place] (patch that sucks out impurities)    CHIEF COMPLIANT: One-year followup from diagnosis of breast cancer  INTERVAL HISTORY: [name] is a 58 year old Caucasian lady with above-mentioned history of right-sided breast cancer. She was treated with right breast mastectomy in June of 2014. She finally did radiation therapy that was completed in June of 2015. She has refused to undergo antiestrogen therapy or chemotherapy in spite of her lymph node positive disease. Instead she went to [place] where she underwent treatment with blue crystals as mentioned above. She reports that she wants to do it again soon. She provided some anecdotes of friends and patient's who had extremely poor prognosis and survived after getting this treatment. Patient does continue to want [place] to do surveillance with mammograms. She just does not want to receive any oral therapy to decrease the chance of recurrent breast cancer.  REVIEW  OF SYSTEMS:   Constitutional: Denies fevers, chills or abnormal weight loss Eyes: Denies blurriness of vision Ears, nose, mouth, throat, and face: Denies mucositis or sore throat Respiratory: Denies cough, dyspnea or wheezes Cardiovascular: Denies palpitation, chest discomfort or lower extremity swelling Gastrointestinal:  Denies nausea, heartburn or change in bowel habits Skin: Denies abnormal skin rashes Lymphatics: Denies new lymphadenopathy or easy bruising Neurological:Denies numbness, tingling or new weaknesses Behavioral/Psych: Mood is stable, no new changes  Breast:  denies any pain or lumps or nodules in left breast All other systems were reviewed with the patient and are negative.  I have reviewed the past medical history, past surgical history, social history and family history with the patient and they are unchanged from previous note.  ALLERGIES:  is allergic to penicillins.  MEDICATIONS:  Current Outpatient Prescriptions  Medication Sig Dispense Refill  . Multiple Vitamins-Minerals (MULTIVITAMIN PO) Take by mouth daily.      Marland Kitchen OVER THE COUNTER MEDICATION Peptids supplements      . UNABLE TO FIND I6803 Silicone Breast Prosthesis, Rt quantity 1 L8020 Mastectomy form, Rt, quantity 1 L8000 Mastectomy bra, quantity 6      . UNABLE TO FIND 174.9 Malignant Neoplasm   Mastectomy Right  L8030-Silicone Breast Prosthesis-1  L8000- Mastectomy Bra-2  1 each  0   No current facility-administered medications for this visit.    PHYSICAL EXAMINATION: ECOG PERFORMANCE STATUS: 0 - Asymptomatic  Filed Vitals:   02/04/14 1450  BP: 124/70  Pulse: 93  Temp: 98.1 F (36.7 C)  Resp:  [place]   02/04/14 1450  Weight: 152 lb (68.947 kg)    GENERAL:alert, no distress and comfortable SKIN: skin color, texture, turgor are normal, no rashes or significant lesions EYES: normal, Conjunctiva are pink and non-injected, sclera clear OROPHARYNX:no exudate, no erythema and  lips, buccal mucosa, and tongue normal  NECK: supple, thyroid normal size, non-tender, without nodularity LYMPH:  no palpable lymphadenopathy in the cervical, axillary or inguinal LUNGS: clear to auscultation and percussion with normal breathing effort HEART: regular rate & rhythm and no murmurs and no lower extremity edema ABDOMEN:abdomen soft, non-tender and normal bowel sounds Musculoskeletal:no cyanosis of digits and no clubbing  NEURO: alert & oriented x 3 with fluent speech, no focal motor/sensory deficits  LABORATORY DATA:  I have reviewed the data as listed   Chemistry      Component Value Date/Time   NA 140 02/04/2014 1441   NA 139 11/08/2012 0620   K 4.1 02/04/2014 1441   K 4.2 11/08/2012 0620   CL 105 11/08/2012 0620   CL 106 10/23/2012 1459   CO2 24 02/04/2014 1441   CO2 30 11/08/2012 0620   BUN 25.0 02/04/2014 1441   BUN 8 11/08/2012 0620   CREATININE 0.8 02/04/2014 1441   CREATININE 0.74 11/08/2012 0620   CREATININE 0.82 08/18/2011 1056      Component Value Date/Time   CALCIUM 10.2 02/04/2014 1441   CALCIUM 9.0 11/08/2012 0620   ALKPHOS 286* 02/04/2014 1441   ALKPHOS 96 11/06/2012 0850   AST 61* 02/04/2014 1441   AST 15 11/06/2012 0850   ALT 68* 02/04/2014 1441   ALT 14 11/06/2012 0850   BILITOT 0.24 02/04/2014 1441   BILITOT 0.4 11/06/2012 0850       Lab Results  Component Value Date   WBC 4.9 02/04/2014   HGB 10.9* 02/04/2014   HCT 34.1* 02/04/2014   MCV 78.4* 02/04/2014   PLT 414* 02/04/2014   NEUTROABS 3.5 02/04/2014     RADIOGRAPHIC STUDIES: No results found.   ASSESSMENT & PLAN:  Cancer of central portion of female breast Right breast cancer T2, N1, M0 stage IIB ER/PR positive HER-2 negative status post right mastectomy and radiation to right chest wall and axilla  Patient has refused adjuvant chemotherapy and antiestrogen therapy and is receiving glucose of treatment from [place] which is applied in the form of a patch that sucks out impurities from the body.  Apparently this leaves a big opening in the skin that heals after one to 2 weeks. She has not been doing that once again.  Surveillance: Mammograms scheduled for March 2016. We will do a breast exam and followup at that time. Survivorship: Discussed the importance of physical exercise in decreasing the likelihood of breast cancer recurrence. Recommended 30 mins daily 6 days a week of either brisk walking or cycling or swimming. Encouraged patient to eat more fruits and vegetables and decrease red meat. Patient plans on doing stretching exercises to relieve the knots in the muscles.  I discussed with her that the muscle pain is not related to radiation and that she should focus on doing exercises and stretching to ease the symptoms     Orders Placed This Encounter  Procedures  . MM Digital Diagnostic Unilat L    Standing Status: Future     Number of Occurrences:      Standing Expiration Date: 02/04/2015    Order Specific Question:  Reason for Exam (SYMPTOM  OR DIAGNOSIS REQUIRED)  Answer:  H/O Right breast cancer mastectomy    Order Specific Question:  Is the patient pregnant?    Answer:  No    Order Specific Question:  Preferred imaging location?    Answer:  [facility]   The patient has a good understanding of the overall plan. she agrees with it. She will call with any problems that may develop before her next visit here.  I spent 15 minutes counseling the patient face to face. The total time spent in the appointment was 20 minutes and more than 50% was on counseling and review of test results    Joanna Eisenmenger, MD 02/04/2014 4:18 PM

## 2014-02-06 ENCOUNTER — Telehealth: Payer: Self-pay | Admitting: Hematology and Oncology

## 2014-02-06 ENCOUNTER — Other Ambulatory Visit: Payer: Self-pay | Admitting: Hematology and Oncology

## 2014-02-06 DIAGNOSIS — Z1231 Encounter for screening mammogram for malignant neoplasm of breast: Secondary | ICD-10-CM

## 2014-02-06 DIAGNOSIS — Z9011 Acquired absence of right breast and nipple: Secondary | ICD-10-CM

## 2014-02-06 NOTE — Telephone Encounter (Signed)
, °

## 2014-02-09 ENCOUNTER — Encounter (HOSPITAL_BASED_OUTPATIENT_CLINIC_OR_DEPARTMENT_OTHER): Payer: Self-pay | Admitting: Emergency Medicine

## 2014-02-09 ENCOUNTER — Emergency Department (HOSPITAL_BASED_OUTPATIENT_CLINIC_OR_DEPARTMENT_OTHER)
Admission: EM | Admit: 2014-02-09 | Discharge: 2014-02-09 | Discharge status: Against Medical Advice | Payer: 59 | Attending: Emergency Medicine | Admitting: Emergency Medicine

## 2014-02-09 DIAGNOSIS — M549 Dorsalgia, unspecified: Secondary | ICD-10-CM | POA: Insufficient documentation

## 2014-02-09 DIAGNOSIS — M542 Cervicalgia: Secondary | ICD-10-CM | POA: Insufficient documentation

## 2014-02-09 NOTE — ED Notes (Addendum)
Pt reports neck and back pain x several weeks.  Reports that OTC medication is not working for pain.  Pt ambulatory-requesting MRI.  Pt reports calling her PCP and seeing them 5 days ago.

## 2014-02-11 ENCOUNTER — Emergency Department (HOSPITAL_COMMUNITY)
Admission: EM | Admit: 2014-02-11 | Discharge: 2014-02-11 | Disposition: A | Discharge status: Home / Self Care | Payer: 59 | Attending: Emergency Medicine | Admitting: Emergency Medicine

## 2014-02-11 ENCOUNTER — Emergency Department (HOSPITAL_COMMUNITY): Payer: 59

## 2014-02-11 ENCOUNTER — Encounter (HOSPITAL_COMMUNITY): Payer: Self-pay | Admitting: Emergency Medicine

## 2014-02-11 ENCOUNTER — Ambulatory Visit (HOSPITAL_BASED_OUTPATIENT_CLINIC_OR_DEPARTMENT_OTHER): Payer: 59 | Admitting: Hematology and Oncology

## 2014-02-11 DIAGNOSIS — Z853 Personal history of malignant neoplasm of breast: Secondary | ICD-10-CM | POA: Diagnosis not present

## 2014-02-11 DIAGNOSIS — C773 Secondary and unspecified malignant neoplasm of axilla and upper limb lymph nodes: Secondary | ICD-10-CM

## 2014-02-11 DIAGNOSIS — Z79899 Other long term (current) drug therapy: Secondary | ICD-10-CM | POA: Diagnosis not present

## 2014-02-11 DIAGNOSIS — Z8742 Personal history of other diseases of the female genital tract: Secondary | ICD-10-CM | POA: Insufficient documentation

## 2014-02-11 DIAGNOSIS — C7952 Secondary malignant neoplasm of bone marrow: Secondary | ICD-10-CM

## 2014-02-11 DIAGNOSIS — M545 Low back pain, unspecified: Secondary | ICD-10-CM

## 2014-02-11 DIAGNOSIS — C419 Malignant neoplasm of bone and articular cartilage, unspecified: Secondary | ICD-10-CM | POA: Diagnosis not present

## 2014-02-11 DIAGNOSIS — Z8719 Personal history of other diseases of the digestive system: Secondary | ICD-10-CM | POA: Diagnosis not present

## 2014-02-11 DIAGNOSIS — Z880 Allergy status to penicillin: Secondary | ICD-10-CM | POA: Diagnosis not present

## 2014-02-11 DIAGNOSIS — Z923 Personal history of irradiation: Secondary | ICD-10-CM | POA: Diagnosis not present

## 2014-02-11 DIAGNOSIS — M542 Cervicalgia: Secondary | ICD-10-CM

## 2014-02-11 DIAGNOSIS — C50119 Malignant neoplasm of central portion of unspecified female breast: Secondary | ICD-10-CM

## 2014-02-11 DIAGNOSIS — Z170 Estrogen receptor positive status [ER+]: Secondary | ICD-10-CM

## 2014-02-11 DIAGNOSIS — C7951 Secondary malignant neoplasm of bone: Secondary | ICD-10-CM

## 2014-02-11 MED ORDER — TRAMADOL HCL 50 MG PO TABS: 50.0000 mg | ORAL_TABLET | Freq: Four times a day (QID) | ORAL | Status: DC | PRN

## 2014-02-11 NOTE — ED Provider Notes (Signed)
CSN: [id number]     Arrival date & time 02/11/14  1146 History  This chart was scribed for non-physician practitioner, Zacarias Pontes, PA-C,working with Malvin Johns, MD, by Marlowe Kays, ED Scribe. This patient was seen in room WTR8/WTR8 and the patient's care was started at 1:21 PM.  Chief Complaint  Patient presents with  . neck and back pain    Patient is a 58 y.o. female presenting with back pain. The history is provided by the patient. No language interpreter was used.  Back Pain Location:  Lumbar spine Quality:  Burning Radiates to:  L thigh Pain severity:  Severe Onset quality:  Gradual Duration:  3 weeks Timing:  Constant Progression:  Worsening Relieved by:  Ibuprofen Ineffective treatments:  Ibuprofen Associated symptoms: no abdominal pain, no bladder incontinence, no bowel incontinence, no chest pain, no dysuria, no fever, no numbness and no weakness   Risk factors: hx of cancer    HPI Comments:  EMANUEL CAMPOS is a 58 y.o. female with a PMHx of ovarian cysts, breast CA in 2014 s/p radiation with no ongoing treatment, who presents to the Emergency Department complaining of severe, left-sided neck pain and severe lower back pain that started approximately 3 weeks ago. Pt describes the pain as a severe, burning pain and rates is at 8/10. The back pain radiates around the left groin and down her left leg. She states that she has been taking Ibuprofen that has relieved the pain but reports that it always returns. She states the pain is worse today. She reports she finished radiation two months ago for breast cancer and saw Dr. Lindi Adie last week. He told her she probably pulled some muscles but it was not related to the radiation. He did not prescribe any medications for pain. She denies SOB, CP, fever, cough, abd pain, n/v/d, weakness, saddle anesthesia, bowel or bladder incontinence, hematochezia, melena, dysuria, or hematuria. Pt is ambulatory without issue.  Past  Medical History  Diagnosis Date  . Ovarian cyst   . Breast calcification, left   . Diverticulitis     when it flares pt. uses Probiotic, also use baking soda & honey everyday   . Breast cancer     "right" (11/07/2012)  . S/P radiation therapy 09/26/13-11/08/13    right breast   Past Surgical History  Procedure Laterality Date  . Mastectomy modified radical Right 11/07/2012  . Vaginal hysterectomy  2012  . Vaginal delivery      x2  . 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 Breast biopsy Right 10/2012  . Mastectomy modified radical Right 11/07/2012    Procedure: MASTECTOMY MODIFIED RADICAL;  Surgeon: Adin Hector, MD;  Location: [facility];  Service: General;  Laterality: Right;  . Simple excision Right 09/03/2013    simple in office excision of mass right axilla   Family History  Problem Relation Age of Onset  . Cancer Mother     ovarian  . Cancer Brother     bone  . Colon cancer Neg Hx    History  Substance Use Topics  . Smoking status: Never Smoker   . Smokeless tobacco: Never Used  . Alcohol Use: No   OB History   Grav Para Term Preterm Abortions TAB SAB Ect Mult Living   '3 2 2  1  1   2     '$ Review of Systems  Constitutional: Negative for fever and chills.  Respiratory: Negative for  cough and shortness of breath.   Cardiovascular: Negative for chest pain.  Gastrointestinal: Negative for nausea, vomiting, abdominal pain, diarrhea, blood in stool and bowel incontinence.  Genitourinary: Negative for bladder incontinence, dysuria, hematuria, flank pain and vaginal discharge.  Musculoskeletal: Positive for back pain, neck pain and neck stiffness.  Skin: Negative for color change.  Neurological: Negative for weakness and numbness.  Hematological: Does not bruise/bleed easily.  Psychiatric/Behavioral: Negative for confusion.   A complete 10 system review of systems was obtained and all systems are negative except as  noted in the HPI and PMH.   Allergies  Penicillins  Home Medications   Prior to Admission medications   Medication Sig Start Date End Date Taking? Authorizing Provider  Multiple Vitamins-Minerals (MULTIVITAMIN PO) Take by mouth daily.    Historical Provider, MD  OVER THE COUNTER MEDICATION Peptids supplements    Historical Provider, MD  UNABLE TO FIND W4315 Silicone Breast Prosthesis, Rt quantity 1 L8020 Mastectomy form, Rt, quantity 1 L8000 Mastectomy bra, quantity 6    Historical Provider, MD  UNABLE TO FIND 174.9 Malignant Neoplasm   Mastectomy Right  L8030-Silicone Breast Prosthesis-1  L8000- Mastectomy Bra-2 11/01/13   Fanny Skates, MD   Triage Vitals: BP 158/78  Pulse 107  Temp(Src) 98.7 F (37.1 C) (Oral)  Resp 16  SpO2 97% Physical Exam  Nursing note and vitals reviewed. Constitutional: She is oriented to person, place, and time. Vital signs are normal. She appears well-developed and well-nourished. No distress.  HENT:  Head: Normocephalic and atraumatic.  Mouth/Throat: Mucous membranes are normal.  Eyes: EOM are normal.  Neck: Neck supple. Spinous process tenderness and muscular tenderness present. No rigidity. Decreased range of motion (due to pain) present. No edema and no erythema present.    ROM limited secondary to pain, spinous process TTP without deformity or step offs, L sided paraspinous muscle TTP with spasm  Cardiovascular: Normal rate and intact distal pulses.   Pulmonary/Chest: Effort normal. No respiratory distress.  Abdominal: Normal appearance. She exhibits no distension.  Musculoskeletal:       Cervical back: She exhibits decreased range of motion (due to pain), tenderness (midline) and spasm (L sided paraspinous muscles).       Lumbar back: She exhibits bony tenderness. She exhibits normal range of motion, no deformity and no spasm.       Back:  Cspine as above Lumbar spinous process TTP with no bony deformity or step offs. No Spasms felt in  paraspinous muscles. FROM intact with minimal pain. Gait WNL, nonantalgic. Strength 5/5 in all extremities, sensation grossly intact in all extremities, cap refill and distal pulses intact in all extremities.   Neurological: She is alert and oriented to person, place, and time. She has normal strength. No sensory deficit. Gait normal.  Skin: Skin is warm, dry and intact. No rash noted.  Psychiatric: She has a normal mood and affect. Her behavior is normal.    ED Course  Procedures (including critical care time)  DIAGNOSTIC STUDIES: Oxygen Saturation is 97% on RA, normal by my interpretation.   COORDINATION OF CARE: 1:29 PM- Will speak with Dr. Tamera Punt about appropriate course of treatment secondary to patient's history. Pt verbalizes understanding and agrees to plan.  1:33 PM- Will CT cervical and lumbar spine.   Medications - No data to display  Labs Review Labs Reviewed - No data to display  Imaging Review Ct Cervical Spine Wo Contrast  02/11/2014   CLINICAL DATA:  Severe left neck pain for  3 weeks. History of breast cancer. No history of injury.  EXAM: CT CERVICAL SPINE WITHOUT CONTRAST  TECHNIQUE: Multidetector CT imaging of the cervical spine was performed without intravenous contrast. Multiplanar CT image reconstructions were also generated.  COMPARISON:  None.  FINDINGS: Mixed lytic and sclerotic lesions are identified throughout the cervical spine. There is a mild inferior endplate compression fracture of C4 with minimal retropulsion off the inferior endplate but no resultant central canal stenosis. Vertebral body height is otherwise maintained. Alignment normal. There is some loss of disc space height and endplate spurring at B1-5. The central canal appears open at all levels. Uncovertebral disease causes foraminal narrowing on the left at C5-6 and C6-7, worse at C5-6.  IMPRESSION: Multiple lytic and sclerotic lesions throughout the cervical spine likely secondary to metastatic disease  given the patient's history of breast cancer. Findings could also be due to multiple myeloma.  Mild inferior endplate compression fracture of C4 is age indeterminate but likely represents a pathologic fracture. There is only minimal bony retropulsion off the inferior endplate but no central canal stenosis.  Spondylosis most notably C5-6.  Critical Value/emergent results were called by telephone at the time of interpretation on 02/11/2014 at 2:39 pm to DR. BELFI, who verbally acknowledged these results.   Electronically Signed   By: Inge Rise M.D.   On: 02/11/2014 14:44   Ct Lumbar Spine Wo Contrast  02/11/2014   CLINICAL DATA:  Severe low back pain beginning 3 weeks ago, 8/10, radiating to LEFT groin and down LEFT leg. History breast cancer post radiation therapy  EXAM: CT LUMBAR SPINE WITHOUT CONTRAST  TECHNIQUE: Multidetector CT imaging of the lumbar spine was performed without intravenous contrast administration. Multiplanar CT image reconstructions were also generated.  COMPARISON:  CT abdomen and pelvis 09/18/2013  FINDINGS: Atherosclerotic calcification aorta.  No additional local retroperitoneal abnormalities.  Bones appear demineralized.  Sclerotic lesions are identified within the L3 and L5 vertebral bodies either representing sclerotic or treated metastases.  Additional bony lucency is seen at the anterior aspect of the L3 vertebral body superiorly which could represent a lytic metastatic lesion.  Additional vague areas of sclerosis are seen within remaining vertebral bodies cannot exclude additional subtle sclerotic or treated metastases.  Vertebral body and disc space heights maintained without fracture or subluxation.  Questionable subtle sclerotic lesion within the RIGHT lateral aspect of the sacrum.  No spinal stenosis or neural foraminal stenosis identified.  No focal disc herniation or neural compression.  IMPRESSION: No evidence of disc herniation or neural compression.  Sclerotic lesions  within the L3 and L5 vertebral bodies, with suspect additional subtle sclerotic lesions within remaining vertebral bodies either representing sclerotic or treated metastases.  Lytic lesion anterior L3 vertebral body cannot exclude lytic metastasis.   Electronically Signed   By: Lavonia Dana M.D.   On: 02/11/2014 14:50     EKG Interpretation None      MDM   Final diagnoses:  Bony metastasis  Neck pain  Midline low back pain without sciatica    58y/o female with PMHx of breast cancer, refused ongoing treatment at last appt 1 wk ago, here with ongoing cervical and lumbar pain. Concerning for mets since pt had done conservative treatment without relief, therefore proceeded with CT imaging which revealed multiple lytic lesions c/w mets to bones. Pt here by herself, and wants to go home, but she has no family or friends at home, son is currently out of town. Will consult Dr. Lindi Adie to see  if she could be followed up ASAP, before sending her home with pain management. Pt seems relatively in shock, states "so I'm dying?" but declines further eval or inpatient pain management. Will await consult return call and plan for d/c with pain management.   4:13 PM No returned page, therefore called office. Levada Dy, nurse at Dr. Geralyn Flash office, returning call, states they will work her in to the schedule today and do all ongoing management for her pain/bony mets. Will d/c pt now with instructions to walk over to cancer center now. Pt agrees. Pt stable at time of discharge without any questions.  I personally performed the services described in this documentation, which was scribed in my presence. The recorded information has been reviewed and is accurate.    Patty Sermons [place], [place] 02/11/14 1658

## 2014-02-11 NOTE — Assessment & Plan Note (Signed)
1. Right breast cancer T2, N1, M0 stage IIB ER/PR positive HER-2 negative status post right mastectomy and radiation to right chest wall and axilla; patient previously refused adjuvant antiestrogen and adjuvant chemotherapies.  2. Neck and back pain: Patient went to emergency room where she had CT of her neck and her back which revealed lytic bone lesions suspicious for metastatic breast cancer. She came straight from the emergency room for office to discuss appropriate management. I recommended treating her with comedo 50 mg by mouth 3 times a day when necessary. We will need to obtain a biopsy of the vertebral not and I will consult interventional radiology for this. I would also like to get a PET/CT scan to restage her.  3. Patient had previously been reluctant to take any anti-estrogen therapy. I recommended that if she was positive for ER and PR, she needs to consider taking antiestrogen therapy. She reported that she will take it at this time. 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 In that the goal of treatment would be palliation, prolongation of life. We are unable to offer her any curative therapy at this time.  4. Return to clinic after the biopsy to discuss the pathology report and to start antiestrogen therapy. I discussed with her that if the pain does not resolve with tramadol, palliative radiation therapy may be an option.

## 2014-02-11 NOTE — Progress Notes (Signed)
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 one out of 13 lymph nodes positive ER 100%, PR 29%, HER-2 negative ratio 0.93 Ki-67 89%   09/26/2013 - 11/08/2013 Radiation Therapy Radiation to right chest wall and right supraclavicular region to a dose of 50.4 gray at 1.8 gray per fraction    Anti-estrogen oral therapy Patient refused antiestrogen therapy and chemotherapy, receives Blue crystal treatment from [place] (patch that sucks out impurities)    CHIEF COMPLIANT: Neck and low back pain  INTERVAL HISTORY: [name] is a 58 year old Caucasian lady with above-mentioned history of right-sided breast cancer diagnosed in June 2014 and she underwent mastectomy followed by radiation therapy to the chest wall and right supraclavicular region but she does use antiestrogen therapy and chemotherapy. Initially she had one positive lymph node it was ER/PR positive HER-2 negative. I saw her recently for her to followup. Today she presented to the emergency room with complaints of 1-2 weeks of low back and neck pain. She was taking ibuprofen which was relieving the pain but her symptoms were getting worse when she turns or twists. She was also getting stomach upset related to ibuprofen. She was seen in emergency room and a CT of her neck and back with update. The scans sho multiple bone lytic and sclerotic lesions suspicious for  metastatic breast cancer. We were asked to see her urgently as an ER followup to discuss the treatment plan.  REVIEW OF SYSTEMS:   Constitutional: Denies fevers, chills or abnormal weight loss Eyes: Denies blurriness of vision Ears, nose, mouth, throat, and face: Denies mucositis or sore throat Respiratory: Denies cough, dyspnea or wheezes Cardiovascular: Denies palpitation, chest discomfort or lower extremity swelling Gastrointestinal:  Denies nausea, heartburn or change in bowel habits Skin: Denies abnormal skin rashes Lymphatics: Denies new lymphadenopathy or easy bruising Neurological:Denies numbness, tingling or new weaknesses Behavioral/Psych: Mood is stable, no new changes  All other systems were reviewed with the patient and are negative.  I have reviewed the past medical history, past surgical history, social history and family history with the patient and they are unchanged from previous note.  ALLERGIES:  is allergic to penicillins.  MEDICATIONS:  Current Outpatient Prescriptions  Medication Sig Dispense Refill  . Multiple Vitamins-Minerals (MULTIVITAMIN PO) Take by mouth daily.      Marland Kitchen OVER THE COUNTER MEDICATION Peptids supplements      . traMADol (ULTRAM) 50 MG tablet Take 1 tablet (50 mg total) by mouth every 6 (six) hours as needed.  90 tablet  3  . UNABLE TO FIND [id number] Silicone Breast Prosthesis, Rt quantity 1 L8020 Mastectomy form, Rt, quantity 1 L8000 Mastectomy bra, quantity 6      . UNABLE TO FIND 174.9 Malignant Neoplasm   Mastectomy Right  L8030-Silicone Breast Prosthesis-1  L8000- Mastectomy Bra-2  1 each  0  No current facility-administered medications for this visit.    PHYSICAL EXAMINATION: ECOG PERFORMANCE STATUS: 2 - Symptomatic, <50% confined to bed  There were no vitals filed for this visit. There were no vitals filed for this visit.  GENERAL:alert, no distress and comfortable SKIN: skin color, texture, turgor are normal, no rashes or  significant lesions EYES: normal, Conjunctiva are pink and non-injected, sclera clear OROPHARYNX:no exudate, no erythema and lips, buccal mucosa, and tongue normal  NECK: supple, thyroid normal size, non-tender, without nodularity LYMPH:  no palpable lymphadenopathy in the cervical, axillary or inguinal LUNGS: clear to auscultation and percussion with normal breathing effort HEART: regular rate & rhythm and no murmurs and no lower extremity edema ABDOMEN:abdomen soft, non-tender and normal bowel sounds Musculoskeletal: Tenderness in the back NEURO: alert & oriented x 3 with fluent speech, no focal motor/sensory deficits  LABORATORY DATA:  I have reviewed the data as listed   Chemistry      Component Value Date/Time   NA 140 02/04/2014 1441   NA 139 11/08/2012 0620   K 4.1 02/04/2014 1441   K 4.2 11/08/2012 0620   CL 105 11/08/2012 0620   CL 106 10/23/2012 1459   CO2 24 02/04/2014 1441   CO2 30 11/08/2012 0620   BUN 25.0 02/04/2014 1441   BUN 8 11/08/2012 0620   CREATININE 0.8 02/04/2014 1441   CREATININE 0.74 11/08/2012 0620   CREATININE 0.82 08/18/2011 1056      Component Value Date/Time   CALCIUM 10.2 02/04/2014 1441   CALCIUM 9.0 11/08/2012 0620   ALKPHOS 286* 02/04/2014 1441   ALKPHOS 96 11/06/2012 0850   AST 61* 02/04/2014 1441   AST 15 11/06/2012 0850   ALT 68* 02/04/2014 1441   ALT 14 11/06/2012 0850   BILITOT 0.24 02/04/2014 1441   BILITOT 0.4 11/06/2012 0850       Lab Results  Component Value Date   WBC 4.9 02/04/2014   HGB 10.9* 02/04/2014   HCT 34.1* 02/04/2014   MCV 78.4* 02/04/2014   PLT 414* 02/04/2014   NEUTROABS 3.5 02/04/2014     RADIOGRAPHIC STUDIES: I have personally reviewed the radiology reports and agreed with their findings. Ct Cervical Spine Wo Contrast  02/11/2014   CLINICAL DATA:  Severe left neck pain for 3 weeks. History of breast cancer. No history of injury.  EXAM: CT CERVICAL SPINE WITHOUT CONTRAST  TECHNIQUE: Multidetector CT imaging of the cervical spine  was performed without intravenous contrast. Multiplanar CT image reconstructions were also generated.  COMPARISON:  None.  FINDINGS: Mixed lytic and sclerotic lesions are identified throughout the cervical spine. There is a mild inferior endplate compression fracture of C4 with minimal retropulsion off the inferior endplate but no resultant central canal stenosis. Vertebral body height is otherwise maintained. Alignment normal. There is some loss of disc space height and endplate spurring at Y1-0. The central canal appears open at all levels. Uncovertebral disease causes foraminal narrowing on the left at C5-6 and C6-7, worse at C5-6.  IMPRESSION: Multiple lytic and sclerotic lesions throughout the cervical spine likely secondary to metastatic disease given the patient's history of breast cancer. Findings could also be due to multiple myeloma.  Mild inferior endplate compression fracture of C4 is age indeterminate but likely represents a pathologic fracture. There is only minimal bony retropulsion off the inferior endplate but no central canal stenosis.  Spondylosis most notably C5-6.  Critical Value/emergent results were called by telephone at the time of interpretation on 02/11/2014 at 2:39 pm  to DR. BELFI, who verbally acknowledged these results.   Electronically Signed   By: Inge Rise M.D.   On: 02/11/2014 14:44   Ct Lumbar Spine Wo Contrast  02/11/2014   CLINICAL DATA:  Severe low back pain beginning 3 weeks ago, 8/10, radiating to LEFT groin and down LEFT leg. History breast cancer post radiation therapy  EXAM: CT LUMBAR SPINE WITHOUT CONTRAST  TECHNIQUE: Multidetector CT imaging of the lumbar spine was performed without intravenous contrast administration. Multiplanar CT image reconstructions were also generated.  COMPARISON:  CT abdomen and pelvis 09/18/2013  FINDINGS: Atherosclerotic calcification aorta.  No additional local retroperitoneal abnormalities.  Bones appear demineralized.  Sclerotic  lesions are identified within the L3 and L5 vertebral bodies either representing sclerotic or treated metastases.  Additional bony lucency is seen at the anterior aspect of the L3 vertebral body superiorly which could represent a lytic metastatic lesion.  Additional vague areas of sclerosis are seen within remaining vertebral bodies cannot exclude additional subtle sclerotic or treated metastases.  Vertebral body and disc space heights maintained without fracture or subluxation.  Questionable subtle sclerotic lesion within the RIGHT lateral aspect of the sacrum.  No spinal stenosis or neural foraminal stenosis identified.  No focal disc herniation or neural compression.  IMPRESSION: No evidence of disc herniation or neural compression.  Sclerotic lesions within the L3 and L5 vertebral bodies, with suspect additional subtle sclerotic lesions within remaining vertebral bodies either representing sclerotic or treated metastases.  Lytic lesion anterior L3 vertebral body cannot exclude lytic metastasis.   Electronically Signed   By: Lavonia Dana M.D.   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 Right breast cancer T2, N1, M0 stage IIB ER/PR positive HER-2 negative status post right mastectomy and radiation to right chest wall and axilla; patient previously refused adjuvant antiestrogen and adjuvant chemotherapies.  2. Neck and back pain: Patient went to emergency room where she had CT of her neck and her back which revealed lytic bone lesions suspicious for metastatic breast cancer. She came straight from the emergency room for office to discuss appropriate management. I recommended treating her with comedo 50 mg by mouth 3 times a day when necessary. We will need to obtain a biopsy of the vertebral not and I will consult interventional radiology for this. I would also like to get a PET/CT scan to restage her.  3. Patient had previously been reluctant to take any  anti-estrogen therapy. I recommended that if she was positive for ER and PR, she needs to consider taking antiestrogen therapy. She reported that she will take it at this time. 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 In that the goal of treatment would be palliation, prolongation of life. We are unable to offer her any curative therapy at this time.  4. Return to clinic after the biopsy to discuss the pathology report and to start antiestrogen therapy. I discussed with her that if the pain does not resolve with tramadol, palliative radiation therapy may be an option.    Orders Placed This Encounter  Procedures  . CT Biopsy    Standing Status: Future     Number of Occurrences:      Standing Expiration Date: 03/18/2015    Order Specific Question:  Reason for Exam (SYMPTOM  OR DIAGNOSIS REQUIRED)    Answer:  CT guided biopsy of the vertebral  mets    Order Specific Question:  Preferred imaging location?    Answer:  [facility]    Order Specific Question:  Is the patient pregnant?    Answer:  No  . NM PET Image Restage (PS) Whole Body    Standing Status: Future     Number of Occurrences:      Standing Expiration Date: 02/11/2015    Order Specific Question:  Reason for Exam (SYMPTOM  OR DIAGNOSIS REQUIRED)    Answer:  Bone lesions suspicious for metastatic disease    Order Specific Question:  Is the patient pregnant?    Answer:  No    Order Specific Question:  Preferred imaging location?    Answer:  [facility]   The patient has a good understanding of the overall plan. she agrees with it. She will call with any problems that may develop before her next visit here.  I spent 30 minutes counseling the patient face to face. The total time spent in the appointment was 30 minutes and more than 50% was on counseling and review of test results    Rulon Eisenmenger, MD 02/11/2014 5:07 PM

## 2014-02-11 NOTE — ED Notes (Signed)
Per pt, states neck and lower back pain-[facility] the [facility]

## 2014-02-11 NOTE — Discharge Instructions (Signed)
Go see Dr. Geralyn Flash office right now, they will work you in and treat your ongoing pain related to the bony metastasis in your spine.

## 2014-02-12 ENCOUNTER — Telehealth: Payer: Self-pay | Admitting: Hematology and Oncology

## 2014-02-12 NOTE — ED Provider Notes (Signed)
Medical screening examination/treatment/procedure(s) were performed by non-physician practitioner and as supervising physician I was immediately available for consultation/collaboration.   EKG Interpretation None       Malvin Johns, MD 02/12/14 0730

## 2014-02-12 NOTE — Telephone Encounter (Signed)
, °

## 2014-02-13 ENCOUNTER — Telehealth: Payer: Self-pay | Admitting: Hematology and Oncology

## 2014-02-13 NOTE — Telephone Encounter (Signed)
, °

## 2014-02-14 ENCOUNTER — Telehealth: Payer: Self-pay

## 2014-02-14 ENCOUNTER — Ambulatory Visit (HOSPITAL_COMMUNITY): Admission: RE | Admit: 2014-02-14 | Payer: 59 | Source: Ambulatory Visit

## 2014-02-14 NOTE — Telephone Encounter (Signed)
Returned pt call - pt reports PET r/s to 10/8 as machine was done.  Let pt know we were looking for fincl assistance for her with the PET.  Pt also wanted to know if we received her disability form from her insurance company.  I let pt know I would check with managed care.  Pt voiced understanding. Inbskt sent to [place].

## 2014-02-14 NOTE — Progress Notes (Signed)
Inbasket sent to [place] for financial assistance for the patient.  Webb Silversmith moved PET out pending this.

## 2014-02-18 ENCOUNTER — Encounter: Payer: Self-pay | Admitting: Hematology and Oncology

## 2014-02-18 ENCOUNTER — Telehealth: Payer: Self-pay

## 2014-02-18 NOTE — Telephone Encounter (Signed)
FMLA form placed on desk today.  In for Dr Lindi Adie to sign. Returned pt call and let her know I had just received the form and it should be signed and sent within the next few days.  Pt voiced understanding.

## 2014-02-18 NOTE — Progress Notes (Signed)
Put fmla form on nurse's desk °

## 2014-02-20 ENCOUNTER — Encounter: Payer: Self-pay | Admitting: Hematology and Oncology

## 2014-02-20 NOTE — Progress Notes (Signed)
Faxed fmla form to [employer] @ [phone] °

## 2014-02-21 ENCOUNTER — Encounter (HOSPITAL_COMMUNITY)
Admission: RE | Admit: 2014-02-21 | Discharge: 2014-02-21 | Disposition: A | Discharge status: Home / Self Care | Payer: 59 | Source: Ambulatory Visit | Attending: Diagnostic Radiology | Admitting: Diagnostic Radiology

## 2014-02-21 DIAGNOSIS — C7951 Secondary malignant neoplasm of bone: Secondary | ICD-10-CM | POA: Insufficient documentation

## 2014-02-21 DIAGNOSIS — C50111 Malignant neoplasm of central portion of right female breast: Secondary | ICD-10-CM | POA: Insufficient documentation

## 2014-02-21 LAB — GLUCOSE, CAPILLARY: Glucose-Capillary: 101 mg/dL — ABNORMAL HIGH (ref 70–99)

## 2014-02-21 MED ORDER — FLUDEOXYGLUCOSE F - 18 (FDG) INJECTION
7.9000 | Freq: Once | INTRAVENOUS | Status: AC | PRN
  Administered 2014-02-21: 7.9 via INTRAVENOUS

## 2014-02-28 ENCOUNTER — Encounter (HOSPITAL_COMMUNITY): Payer: Self-pay | Admitting: Pharmacy Technician

## 2014-02-28 ENCOUNTER — Telehealth: Payer: Self-pay

## 2014-02-28 NOTE — Telephone Encounter (Signed)
Let pt know we would need to reschedule appt to after 10/19 biopsy.  Pt voiced understanding.   Appt cancelled.  POF sent to r/s

## 2014-03-01 ENCOUNTER — Telehealth: Payer: Self-pay | Admitting: Hematology and Oncology

## 2014-03-01 ENCOUNTER — Ambulatory Visit: Payer: 59 | Admitting: Hematology and Oncology

## 2014-03-01 ENCOUNTER — Other Ambulatory Visit: Payer: Self-pay | Admitting: Radiology

## 2014-03-01 NOTE — Telephone Encounter (Signed)
returned pt call adn s.w pt and confirmed pt appts....pt ok and aware

## 2014-03-04 ENCOUNTER — Ambulatory Visit (HOSPITAL_COMMUNITY)
Admission: RE | Admit: 2014-03-04 | Discharge: 2014-03-04 | Disposition: A | Discharge status: Home / Self Care | Payer: 59 | Source: Ambulatory Visit | Attending: Hematology and Oncology | Admitting: Hematology and Oncology

## 2014-03-04 ENCOUNTER — Encounter (HOSPITAL_COMMUNITY): Payer: Self-pay

## 2014-03-04 DIAGNOSIS — C50111 Malignant neoplasm of central portion of right female breast: Secondary | ICD-10-CM | POA: Diagnosis not present

## 2014-03-04 LAB — CBC
HEMATOCRIT: 32.3 % — AB (ref 36.0–46.0)
Hemoglobin: 10 g/dL — ABNORMAL LOW (ref 12.0–15.0)
MCH: 23.5 pg — AB (ref 26.0–34.0)
MCHC: 31 g/dL (ref 30.0–36.0)
MCV: 75.8 fL — ABNORMAL LOW (ref 78.0–100.0)
PLATELETS: 390 10*3/uL (ref 150–400)
RBC: 4.26 MIL/uL (ref 3.87–5.11)
RDW: 17.2 % — AB (ref 11.5–15.5)
WBC: 4.6 10*3/uL (ref 4.0–10.5)

## 2014-03-04 LAB — PROTIME-INR
INR: 1.01 (ref 0.00–1.49)
Prothrombin Time: 13.4 seconds (ref 11.6–15.2)

## 2014-03-04 LAB — APTT: aPTT: 30 seconds (ref 24–37)

## 2014-03-04 MED ORDER — FENTANYL CITRATE 0.05 MG/ML IJ SOLN
INTRAMUSCULAR | Status: AC | PRN
  Administered 2014-03-04 (×2): 50 ug via INTRAVENOUS

## 2014-03-04 MED ORDER — SODIUM CHLORIDE 0.9 % IV SOLN
INTRAVENOUS | Status: DC
  Administered 2014-03-04: 08:00:00 via INTRAVENOUS

## 2014-03-04 MED ORDER — MIDAZOLAM HCL 2 MG/2ML IJ SOLN
INTRAMUSCULAR | Status: AC | PRN
  Administered 2014-03-04 (×3): 1 mg via INTRAVENOUS

## 2014-03-04 MED ORDER — MIDAZOLAM HCL 2 MG/2ML IJ SOLN
INTRAMUSCULAR | Status: AC
  Filled 2014-03-04: qty 6

## 2014-03-04 MED ORDER — FENTANYL CITRATE 0.05 MG/ML IJ SOLN
INTRAMUSCULAR | Status: AC
  Filled 2014-03-04: qty 6

## 2014-03-04 NOTE — H&P (Signed)
Chief Complaint: "I'm having a bone biopsy"  Referring Physician(s): Gudena,Vinay K  History of Present Illness: [name] is a 58 y.o. female with history of right breast carcinoma 2014 and recent complaints of back pain with PET scan revealing widespread osseous metastatic disease. She presents today for CT guided biopsy of a right iliac bone lesion.  Past Medical History  Diagnosis Date  . Ovarian cyst   . Breast calcification, left   . Diverticulitis     when it flares pt. uses Probiotic, also use baking soda & honey everyday   . Breast cancer     "right" (11/07/2012)  . S/P radiation therapy 09/26/13-11/08/13    right breast    Past Surgical History  Procedure Laterality Date  . Mastectomy modified radical Right 11/07/2012  . Vaginal hysterectomy  2012  . Vaginal delivery      x2  . 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 Breast biopsy Right 10/2012  . Mastectomy modified radical Right 11/07/2012    Procedure: MASTECTOMY MODIFIED RADICAL;  Surgeon: Adin Hector, MD;  Location: [facility];  Service: General;  Laterality: Right;  . Simple excision Right 09/03/2013    simple in office excision of mass right axilla    Allergies: Penicillins  Medications: Prior to Admission medications   Medication Sig Start Date End Date Taking? Authorizing Provider  Calcium Carbonate-Vit D-Min (CALCIUM 1200 PO) Take 1 tablet by mouth every morning.   Yes Historical Provider, MD  Multiple Vitamins-Minerals (MULTIVITAMIN PO) Take 1 tablet by mouth every morning.    Yes Historical Provider, MD  OVER THE COUNTER MEDICATION Take 4 tablets by mouth every morning. Peptids supplements   Yes Historical Provider, MD  OVER THE COUNTER MEDICATION Take 4 tablets by mouth 2 (two) times daily. ([place])   Yes Historical Provider, MD  traMADol (ULTRAM) 50 MG tablet Take 50 mg by mouth every 6 (six) hours as needed for moderate pain.   Yes  Historical Provider, MD  UNABLE TO FIND [id number] Silicone Breast Prosthesis, Rt quantity 1 L8020 Mastectomy form, Rt, quantity 1 L8000 Mastectomy bra, quantity 6    Historical Provider, MD  UNABLE TO FIND 174.9 Malignant Neoplasm   Mastectomy Right  L8030-Silicone Breast Prosthesis-1  L8000- Mastectomy Bra-2 11/01/13   Fanny Skates, MD    Family History  Problem Relation Age of Onset  . Cancer Mother     ovarian  . Cancer Brother     bone  . Colon cancer Neg Hx     History   Social History  . Marital Status: Single    Spouse Name: N/A    Number of Children: 2  . Years of Education: N/A   Occupational History  .  [employer]    flexible nurse technician   Social History Main Topics  . Smoking status: Never Smoker   . Smokeless tobacco: Never Used  . Alcohol Use: No  . Drug Use: No  . Sexual Activity: Not Currently    Partners: Male   Other Topics Concern  . None   Social History Narrative  . None         Review of Systems  Constitutional: Negative for fever and chills.  Respiratory: Negative for cough and shortness of breath.   Cardiovascular: Negative for chest pain.  Gastrointestinal: Negative for nausea, vomiting, abdominal pain and blood in stool.  Genitourinary: Negative for dysuria and hematuria.  Musculoskeletal:  Positive for back pain.  Neurological: Negative for headaches.  Hematological: Does not bruise/bleed easily.    Vital Signs: BP 134/81  Pulse 89  Temp(Src) 98.1 F (36.7 C) (Oral)  Resp 18  Ht $R'5\' 5"'vi$  (1.651 m)  Wt 149 lb (67.586 kg)  BMI 24.79 kg/m2  SpO2 98%  Physical Exam  Constitutional: She appears well-developed and well-nourished.  Cardiovascular: Normal rate and regular rhythm.   Pulmonary/Chest: Effort normal and breath sounds normal.  Abdominal: Soft. Bowel sounds are normal. There is no tenderness.  Musculoskeletal: Normal range of motion. She exhibits no edema.  Neurological: She is alert.    Imaging: Ct  Cervical Spine Wo Contrast  02/11/2014   CLINICAL DATA:  Severe left neck pain for 3 weeks. History of breast cancer. No history of injury.  EXAM: CT CERVICAL SPINE WITHOUT CONTRAST  TECHNIQUE: Multidetector CT imaging of the cervical spine was performed without intravenous contrast. Multiplanar CT image reconstructions were also generated.  COMPARISON:  None.  FINDINGS: Mixed lytic and sclerotic lesions are identified throughout the cervical spine. There is a mild inferior endplate compression fracture of C4 with minimal retropulsion off the inferior endplate but no resultant central canal stenosis. Vertebral body height is otherwise maintained. Alignment normal. There is some loss of disc space height and endplate spurring at X3-2. The central canal appears open at all levels. Uncovertebral disease causes foraminal narrowing on the left at C5-6 and C6-7, worse at C5-6.  IMPRESSION: Multiple lytic and sclerotic lesions throughout the cervical spine likely secondary to metastatic disease given the patient's history of breast cancer. Findings could also be due to multiple myeloma.  Mild inferior endplate compression fracture of C4 is age indeterminate but likely represents a pathologic fracture. There is only minimal bony retropulsion off the inferior endplate but no central canal stenosis.  Spondylosis most notably C5-6.  Critical Value/emergent results were called by telephone at the time of interpretation on 02/11/2014 at 2:39 pm to DR. BELFI, who verbally acknowledged these results.   Electronically Signed   By: Inge Rise M.D.   On: 02/11/2014 14:44   Ct Lumbar Spine Wo Contrast  02/11/2014   CLINICAL DATA:  Severe low back pain beginning 3 weeks ago, 8/10, radiating to LEFT groin and down LEFT leg. History breast cancer post radiation therapy  EXAM: CT LUMBAR SPINE WITHOUT CONTRAST  TECHNIQUE: Multidetector CT imaging of the lumbar spine was performed without intravenous contrast administration.  Multiplanar CT image reconstructions were also generated.  COMPARISON:  CT abdomen and pelvis 09/18/2013  FINDINGS: Atherosclerotic calcification aorta.  No additional local retroperitoneal abnormalities.  Bones appear demineralized.  Sclerotic lesions are identified within the L3 and L5 vertebral bodies either representing sclerotic or treated metastases.  Additional bony lucency is seen at the anterior aspect of the L3 vertebral body superiorly which could represent a lytic metastatic lesion.  Additional vague areas of sclerosis are seen within remaining vertebral bodies cannot exclude additional subtle sclerotic or treated metastases.  Vertebral body and disc space heights maintained without fracture or subluxation.  Questionable subtle sclerotic lesion within the RIGHT lateral aspect of the sacrum.  No spinal stenosis or neural foraminal stenosis identified.  No focal disc herniation or neural compression.  IMPRESSION: No evidence of disc herniation or neural compression.  Sclerotic lesions within the L3 and L5 vertebral bodies, with suspect additional subtle sclerotic lesions within remaining vertebral bodies either representing sclerotic or treated metastases.  Lytic lesion anterior L3 vertebral body cannot exclude lytic metastasis.  Electronically Signed   By: Lavonia Dana M.D.   On: 02/11/2014 14:50   Nm Pet Image Restag (ps) Skull Base To Thigh  02/21/2014   CLINICAL DATA:  Subsequent treatment strategy for metastatic breast cancer.  EXAM: NUCLEAR MEDICINE PET SKULL BASE TO THIGH  TECHNIQUE: 7.9 mCi F-18 FDG was injected intravenously. Full-ring PET imaging was performed from the skull base to thigh after the radiotracer. CT data was obtained and used for attenuation correction and anatomic localization.  FASTING BLOOD GLUCOSE:  Value: 101 mg/dl  COMPARISON:  CT scans 02/11/2014 and 09/18/2013  FINDINGS: NECK  No hypermetabolic lymph nodes in the neck.  CHEST  No hypermetabolic mediastinal or hilar  nodes. No suspicious pulmonary nodules on the CT scan. There are radiation changes involving the anterior aspect of the right lung. Is also bibasilar scarring and atelectasis. No obvious pulmonary nodules.  ABDOMEN/PELVIS  No abnormal hypermetabolic activity within the liver, pancreas, adrenal glands, or spleen. No hypermetabolic lymph nodes in the abdomen or pelvis.  SKELETON  There is diffuse widespread osseous metastatic disease involving the axial and appendicular skeleton. The spine, ribs and pelvis are diffusely in wall. There are also lesions in the femurs bilaterally, the left scapula and both humeri. Skull lesions are also noted.  IMPRESSION: Diffuse/widespread osseous metastatic disease.  No chest wall tumor, axillary adenopathy, lung nodules or abdominal metastasis.   Electronically Signed   By: Kalman Jewels M.D.   On: 02/21/2014 15:24    Labs:  CBC:  Recent Labs  03/26/13 1018 02/04/14 1440 03/04/14 0745  WBC 4.5 4.9 4.6  HGB 13.4 10.9* 10.0*  HCT 40.5 34.1* 32.3*  PLT 330 414* 390    COAGS:  Recent Labs  03/04/14 0745  INR 1.01  APTT 30    BMP:  Recent Labs  03/26/13 1018 09/18/13 0843 02/04/14 1441  NA 142  --  140  K 4.2  --  4.1  CO2 28  --  24  GLUCOSE 93  --  100  BUN 16.0 17.9 25.0  CALCIUM 10.0  --  10.2  CREATININE 0.8 0.9 0.8    LIVER FUNCTION TESTS:  Recent Labs  03/26/13 1018 02/04/14 1441  BILITOT 0.46 0.24  AST 26 61*  ALT 30 68*  ALKPHOS 107 286*  PROT 7.5 8.2  ALBUMIN 4.0 3.1*    TUMOR MARKERS: No results found for this basename: AFPTM, CEA, CA199, CHROMGRNA,  in the last 8760 hours  Assessment and Plan: [name] is a 58 y.o. female with history of right breast carcinoma 2014 and recent complaints of back pain with PET scan revealing widespread osseous metastatic disease. She presents today for CT guided biopsy of a right iliac bone lesion. Details/risks of procedure d/w pt with her understanding and  consent.          Signed: Autumn Messing 03/04/2014, 8:27 AM

## 2014-03-04 NOTE — Discharge Instructions (Signed)
Bone Marrow Aspiration, Bone Marrow Biopsy °Care After °Read the instructions outlined below and refer to this sheet in the next few weeks. These discharge instructions provide you with general information on caring for yourself after you leave the hospital. Your caregiver may also give you specific instructions. While your treatment has been planned according to the most current medical practices available, unavoidable complications occasionally occur. If you have any problems or questions after discharge, call your caregiver. °FINDING OUT THE RESULTS OF YOUR TEST °Not all test results are available during your visit. If your test results are not back during the visit, make an appointment with your caregiver to find out the results. Do not assume everything is normal if you have not heard from your caregiver or the medical facility. It is important for you to follow up on all of your test results.  °HOME CARE INSTRUCTIONS  °You have had sedation and may be sleepy or dizzy. Your thinking may not be as clear as usual. For the next 24 hours: °· Only take over-the-counter or prescription medicines for pain, discomfort, and or fever as directed by your caregiver. °· Do not drink alcohol. °· Do not smoke. °· Do not drive. °· Do not make important legal decisions. °· Do not operate heavy machinery. °· Do not care for small children by yourself. °· Keep your dressing clean and dry. You may replace dressing with a bandage after 24 hours. °· You may take a bath or shower after 24 hours. °· Use an ice pack for 20 minutes every 2 hours while awake for pain as needed. °SEEK MEDICAL CARE IF:  °· There is redness, swelling, or increasing pain at the biopsy site. °· There is pus coming from the biopsy site. °· There is drainage from a biopsy site lasting longer than one day. °· An unexplained oral temperature above 102° F (38.9° C) develops. °SEEK IMMEDIATE MEDICAL CARE IF:  °· You develop a rash. °· You have difficulty  breathing. °· You develop any reaction or side effects to medications given. °Document Released: 11/20/2004 Document Revised: 07/26/2011 Document Reviewed: 04/30/2008 °Exit[employer]® Patient Information ©2015 [employer]. This information is not intended to replace advice given to you by your health care provider. Make sure you discuss any questions you have with your health care provider. °Conscious Sedation, Adult, Care After °Refer to this sheet in the next few weeks. These instructions provide you with information on caring for yourself after your procedure. Your health care provider may also give you more specific instructions. Your treatment has been planned according to current medical practices, but problems sometimes occur. Call your health care provider if you have any problems or questions after your procedure. °WHAT TO EXPECT AFTER THE PROCEDURE  °After your procedure: °· You may feel sleepy, clumsy, and have poor balance for several hours. °· Vomiting may occur if you eat too soon after the procedure. °HOME CARE INSTRUCTIONS °· Do not participate in any activities where you could become injured for at least 24 hours. Do not: °¨ Drive. °¨ Swim. °¨ Ride a bicycle. °¨ Operate heavy machinery. °¨ Cook. °¨ Use power tools. °¨ Climb ladders. °¨ Work from a high place. °· Do not make important decisions or sign legal documents until you are improved. °· If you vomit, drink water, juice, or soup when you can drink without vomiting. Make sure you have little or no nausea before eating solid foods. °· Only take over-the-counter or prescription medicines for pain, discomfort, or fever   as directed by your health care provider.  Make sure you and your family fully understand everything about the medicines given to you, including what side effects may occur.  You should not drink alcohol, take sleeping pills, or take medicines that cause drowsiness for at least 24 hours.  If you smoke, do not smoke without  supervision.  If you are feeling better, you may resume normal activities 24 hours after you were sedated.  Keep all appointments with your health care provider. SEEK MEDICAL CARE IF:  Your skin is pale or bluish in color.  You continue to feel nauseous or vomit.  Your pain is getting worse and is not helped by medicine.  You have bleeding or swelling.  You are still sleepy or feeling clumsy after 24 hours. SEEK IMMEDIATE MEDICAL CARE IF:  You develop a rash.  You have difficulty breathing.  You develop any type of allergic problem.  You have a fever. MAKE SURE YOU:  Understand these instructions.  Will watch your condition.  Will get help right away if you are not doing well or get worse. Document Released: 02/21/2013 Document Reviewed: 02/21/2013 [facility] Patient Information 2015 [place], [place]. This information is not intended to replace advice given to you by your health care provider. Make sure you discuss any questions you have with your health care provider.

## 2014-03-04 NOTE — H&P (Signed)
Agree.  For CT guided bone biopsy today.  Iliac bone is most accessible target.

## 2014-03-04 NOTE — Procedures (Signed)
Procedure:  CT guided right iliac bone biopsy Findings:  Right iliac bone 11 G core biopsy performed.  No complications.

## 2014-03-05 ENCOUNTER — Telehealth: Payer: Self-pay | Admitting: Hematology and Oncology

## 2014-03-05 NOTE — Telephone Encounter (Signed)
, °

## 2014-03-11 ENCOUNTER — Other Ambulatory Visit: Payer: Self-pay | Admitting: *Deleted

## 2014-03-11 DIAGNOSIS — C50919 Malignant neoplasm of unspecified site of unspecified female breast: Secondary | ICD-10-CM

## 2014-03-11 DIAGNOSIS — C50119 Malignant neoplasm of central portion of unspecified female breast: Secondary | ICD-10-CM

## 2014-03-11 MED ORDER — LORAZEPAM 0.5 MG PO TABS: ORAL_TABLET | ORAL | Status: DC

## 2014-03-11 MED ORDER — OXYCODONE-ACETAMINOPHEN 5-325 MG PO TABS: ORAL_TABLET | ORAL | Status: DC

## 2014-03-11 NOTE — Progress Notes (Signed)
This RN spoke with pt per her " walk in " to clinic to ask for " pain med and something for sleep ".  Per further inquiry Zoei states ongoing pain with use of tramadol as well as " very restless ". Pain is " primarily in my back and hips " that is not controlled well.  Noted new dx of widespread bone mets.  Pt is scheduled to see DR VG tomorrow.  Per review with DR GM- obtained prescriptionsfor percocet and ativan.  Medications reviewed with pt as well as verified with pt need to maintain appointment scheduled for tomorrow.

## 2014-03-12 ENCOUNTER — Ambulatory Visit (HOSPITAL_BASED_OUTPATIENT_CLINIC_OR_DEPARTMENT_OTHER): Payer: 59 | Admitting: Hematology and Oncology

## 2014-03-12 VITALS — BP 149/59 | HR 99 | Temp 99.6°F | Resp 18 | Ht 65.0 in | Wt 151.9 lb

## 2014-03-12 DIAGNOSIS — C50111 Malignant neoplasm of central portion of right female breast: Secondary | ICD-10-CM

## 2014-03-12 DIAGNOSIS — C50119 Malignant neoplasm of central portion of unspecified female breast: Secondary | ICD-10-CM

## 2014-03-12 DIAGNOSIS — C7951 Secondary malignant neoplasm of bone: Secondary | ICD-10-CM

## 2014-03-12 DIAGNOSIS — Z170 Estrogen receptor positive status [ER+]: Secondary | ICD-10-CM

## 2014-03-12 MED ORDER — OXYCODONE-ACETAMINOPHEN 5-325 MG PO TABS: ORAL_TABLET | ORAL | Status: DC

## 2014-03-12 NOTE — Assessment & Plan Note (Signed)
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  Pathology counseling: I reviewed the pathology report which was consistent with metastatic breast cancer involving the bone.  Recommendation: Ibrance with letrozole along with Xgeva once a month. Return to clinic one week after starting Ibrance for blood count check as well as to receive Xgeva at the same time.

## 2014-03-12 NOTE — Progress Notes (Signed)
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 one out of 13 lymph nodes positive ER 100%, PR 29%, HER-2 negative ratio 0.93 Ki-67 89%   09/26/2013 - 11/08/2013 Radiation Therapy Radiation to right chest wall and right supraclavicular region to a dose of 50.4 gray at 1.8 gray per fraction    Anti-estrogen oral therapy Patient refused antiestrogen therapy and chemotherapy, receives Blue crystal treatment from [place] (patch that sucks out impurities)   02/21/2014 PET scan Diffuse bone metastases involving the axial and appendicular skeleton, biopsy of the left iliac bone positive for metastatic breast cancer    CHIEF COMPLIANT: Metastatic breast cancer here to discuss treatment options  INTERVAL HISTORY: [name] is a 58 year old Caucasian lady with above-mentioned history of right-sided breast cancer diagnosed June 2014 no results with metastatic disease to the bones October 2015. She had bone biopsy and a PET/CT scan and is here today to discuss those results and treatment plan. She was having a lot of bone pain for which she was prescribed Percocet yesterday and that helped her with the pain issues.  REVIEW OF SYSTEMS:   Constitutional: Denies fevers, chills or abnormal weight loss; bone pain is improved since starting Percocet Eyes: Denies blurriness of vision Ears, nose, mouth, throat, and  face: Denies mucositis or sore throat Respiratory: Denies cough, dyspnea or wheezes Cardiovascular: Denies palpitation, chest discomfort or lower extremity swelling Gastrointestinal:  Denies nausea, heartburn or change in bowel habits Skin: Denies abnormal skin rashes Lymphatics: Denies new lymphadenopathy or easy bruising Neurological:Denies numbness, tingling or new weaknesses Behavioral/Psych: Mood is stable, no new changes  Breast:  denies any pain or lumps or nodules in either breasts All other systems were reviewed with the patient and are negative.  I have reviewed the past medical history, past surgical history, social history and family history with the patient and they are unchanged from previous note.  ALLERGIES:  is allergic to penicillins.  MEDICATIONS:  Current Outpatient Prescriptions  Medication Sig Dispense Refill  . Calcium Carbonate-Vit D-Min (CALCIUM 1200 PO) Take 1 tablet by mouth every morning.      Marland Kitchen LORazepam (ATIVAN) 0.5 MG tablet May take 1-2 tablets every 8 hours as needed for restlessness.  30 tablet  0  . Multiple Vitamins-Minerals (MULTIVITAMIN PO) Take 1 tablet by mouth every morning.       Marland Kitchen OVER THE COUNTER MEDICATION Take 4 tablets by mouth every morning. Peptids supplements      . OVER THE COUNTER MEDICATION Take 4 tablets by mouth 2 (two) times daily. ([place])      . oxyCODONE-acetaminophen (PERCOCET/ROXICET) 5-325 MG per tablet 1-2 tabs every 6 hours as needed  30 tablet  0  . traMADol (ULTRAM) 50 MG tablet Take 50 mg by mouth every 6 (six) hours as needed for moderate  pain.      Karen Kays TO FIND [id number] Silicone Breast Prosthesis, Rt quantity 1 L8020 Mastectomy form, Rt, quantity 1 L8000 Mastectomy bra, quantity 6      . UNABLE TO FIND 174.9 Malignant Neoplasm   Mastectomy Right  L8030-Silicone Breast Prosthesis-1  L8000- Mastectomy Bra-2  1 each  0   No current facility-administered medications for this visit.    PHYSICAL  EXAMINATION: ECOG PERFORMANCE STATUS: 1 - Symptomatic but completely ambulatory  Filed Vitals:   03/12/14 1500  BP: 149/59  Pulse: 99  Temp: 99.6 F (37.6 C)  Resp: 18   Filed Weights   03/12/14 1500  Weight: 151 lb 14.4 oz (68.901 kg)    GENERAL:alert, no distress and comfortable SKIN: skin color, texture, turgor are normal, no rashes or significant lesions EYES: normal, Conjunctiva are pink and non-injected, sclera clear OROPHARYNX:no exudate, no erythema and lips, buccal mucosa, and tongue normal  NECK: supple, thyroid normal size, non-tender, without nodularity LYMPH:  no palpable lymphadenopathy in the cervical, axillary or inguinal LUNGS: clear to auscultation and percussion with normal breathing effort HEART: regular rate & rhythm and no murmurs and no lower extremity edema ABDOMEN:abdomen soft, non-tender and normal bowel sounds Musculoskeletal:no cyanosis of digits and no clubbing  NEURO: alert & oriented x 3 with fluent speech, no focal motor/sensory deficits  LABORATORY DATA:  I have reviewed the data as listed   Chemistry      Component Value Date/Time   NA 140 02/04/2014 1441   NA 139 11/08/2012 0620   K 4.1 02/04/2014 1441   K 4.2 11/08/2012 0620   CL 105 11/08/2012 0620   CL 106 10/23/2012 1459   CO2 24 02/04/2014 1441   CO2 30 11/08/2012 0620   BUN 25.0 02/04/2014 1441   BUN 8 11/08/2012 0620   CREATININE 0.8 02/04/2014 1441   CREATININE 0.74 11/08/2012 0620   CREATININE 0.82 08/18/2011 1056      Component Value Date/Time   CALCIUM 10.2 02/04/2014 1441   CALCIUM 9.0 11/08/2012 0620   ALKPHOS 286* 02/04/2014 1441   ALKPHOS 96 11/06/2012 0850   AST 61* 02/04/2014 1441   AST 15 11/06/2012 0850   ALT 68* 02/04/2014 1441   ALT 14 11/06/2012 0850   BILITOT 0.24 02/04/2014 1441   BILITOT 0.4 11/06/2012 0850       Lab Results  Component Value Date   WBC 4.6 03/04/2014   HGB 10.0* 03/04/2014   HCT 32.3* 03/04/2014   MCV 75.8* 03/04/2014   PLT 390 03/04/2014    NEUTROABS 3.5 02/04/2014     RADIOGRAPHIC STUDIES: I have personally reviewed the radiology reports and agreed with their findings. No results found.   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  Pathology counseling: I reviewed the pathology report which was consistent with metastatic breast cancer involving the bone.  Recommendation: Ibrance with letrozole along with Xgeva once a month. Return to clinic one week after starting Ibrance for blood count check as well as to receive Xgeva at the same time.   No orders of the defined types were placed in this encounter.   The patient has a good understanding of the overall plan. she agrees with it. She will call with any  problems that may develop before her next visit here.  I spent 20 minutes counseling the patient face to face. The total time spent in the appointment was 25 minutes and more than 50% was on counseling and review of test results    Rulon Eisenmenger, MD 03/12/2014 3:59 PM

## 2014-03-13 ENCOUNTER — Telehealth: Payer: Self-pay | Admitting: *Deleted

## 2014-03-13 ENCOUNTER — Other Ambulatory Visit: Payer: Self-pay | Admitting: *Deleted

## 2014-03-13 ENCOUNTER — Other Ambulatory Visit: Payer: Self-pay | Admitting: Hematology and Oncology

## 2014-03-13 ENCOUNTER — Encounter: Payer: Self-pay | Admitting: Hematology and Oncology

## 2014-03-13 ENCOUNTER — Telehealth: Payer: Self-pay | Admitting: Hematology and Oncology

## 2014-03-13 DIAGNOSIS — C50111 Malignant neoplasm of central portion of right female breast: Secondary | ICD-10-CM

## 2014-03-13 MED ORDER — PALBOCICLIB 125 MG PO CAPS: 125.0000 mg | ORAL_CAPSULE | Freq: Every day | ORAL | Status: DC

## 2014-03-13 NOTE — Progress Notes (Signed)
Put [employer] disability form on nurse's desk

## 2014-03-13 NOTE — Telephone Encounter (Signed)
Received disability form from [employer]. Form to [employer] [place].

## 2014-03-13 NOTE — Telephone Encounter (Signed)
, °

## 2014-03-18 ENCOUNTER — Encounter (HOSPITAL_COMMUNITY): Payer: Self-pay

## 2014-03-20 ENCOUNTER — Encounter: Payer: Self-pay | Admitting: Hematology and Oncology

## 2014-03-20 NOTE — Progress Notes (Signed)
Faxed disability form to [employer] @ [phone]

## 2014-03-22 ENCOUNTER — Ambulatory Visit (HOSPITAL_BASED_OUTPATIENT_CLINIC_OR_DEPARTMENT_OTHER): Payer: 59 | Admitting: Oncology

## 2014-03-22 ENCOUNTER — Ambulatory Visit: Payer: 59

## 2014-03-22 ENCOUNTER — Encounter: Payer: Self-pay | Admitting: Oncology

## 2014-03-22 ENCOUNTER — Other Ambulatory Visit (HOSPITAL_BASED_OUTPATIENT_CLINIC_OR_DEPARTMENT_OTHER): Payer: 59

## 2014-03-22 ENCOUNTER — Telehealth: Payer: Self-pay | Admitting: Oncology

## 2014-03-22 VITALS — BP 119/66 | HR 92 | Temp 98.0°F | Resp 18 | Ht 65.0 in | Wt 148.0 lb

## 2014-03-22 DIAGNOSIS — C773 Secondary and unspecified malignant neoplasm of axilla and upper limb lymph nodes: Secondary | ICD-10-CM

## 2014-03-22 DIAGNOSIS — C50111 Malignant neoplasm of central portion of right female breast: Secondary | ICD-10-CM

## 2014-03-22 DIAGNOSIS — C50919 Malignant neoplasm of unspecified site of unspecified female breast: Secondary | ICD-10-CM

## 2014-03-22 DIAGNOSIS — C50811 Malignant neoplasm of overlapping sites of right female breast: Secondary | ICD-10-CM

## 2014-03-22 DIAGNOSIS — C7951 Secondary malignant neoplasm of bone: Secondary | ICD-10-CM

## 2014-03-22 DIAGNOSIS — Z170 Estrogen receptor positive status [ER+]: Secondary | ICD-10-CM

## 2014-03-22 DIAGNOSIS — C50119 Malignant neoplasm of central portion of unspecified female breast: Secondary | ICD-10-CM

## 2014-03-22 LAB — COMPREHENSIVE METABOLIC PANEL (CC13)
ALT: 63 U/L — ABNORMAL HIGH (ref 0–55)
ANION GAP: 13 meq/L — AB (ref 3–11)
AST: 63 U/L — ABNORMAL HIGH (ref 5–34)
Albumin: 2.5 g/dL — ABNORMAL LOW (ref 3.5–5.0)
Alkaline Phosphatase: 642 U/L — ABNORMAL HIGH (ref 40–150)
BILIRUBIN TOTAL: 0.41 mg/dL (ref 0.20–1.20)
BUN: 13.7 mg/dL (ref 7.0–26.0)
CO2: 26 meq/L (ref 22–29)
CREATININE: 0.9 mg/dL (ref 0.6–1.1)
Calcium: 10.3 mg/dL (ref 8.4–10.4)
Chloride: 99 mEq/L (ref 98–109)
Glucose: 138 mg/dl (ref 70–140)
Potassium: 4.6 mEq/L (ref 3.5–5.1)
SODIUM: 138 meq/L (ref 136–145)
TOTAL PROTEIN: 7.6 g/dL (ref 6.4–8.3)

## 2014-03-22 LAB — CBC WITH DIFFERENTIAL/PLATELET
BASO%: 0.5 % (ref 0.0–2.0)
Basophils Absolute: 0 10*3/uL (ref 0.0–0.1)
EOS%: 0.1 % (ref 0.0–7.0)
Eosinophils Absolute: 0 10*3/uL (ref 0.0–0.5)
HEMATOCRIT: 30.6 % — AB (ref 34.8–46.6)
HGB: 9.6 g/dL — ABNORMAL LOW (ref 11.6–15.9)
LYMPH#: 0.6 10*3/uL — AB (ref 0.9–3.3)
LYMPH%: 14.2 % (ref 14.0–49.7)
MCH: 23.2 pg — ABNORMAL LOW (ref 25.1–34.0)
MCHC: 31.2 g/dL — AB (ref 31.5–36.0)
MCV: 74.3 fL — ABNORMAL LOW (ref 79.5–101.0)
MONO#: 0.1 10*3/uL (ref 0.1–0.9)
MONO%: 2.3 % (ref 0.0–14.0)
NEUT#: 3.4 10*3/uL (ref 1.5–6.5)
NEUT%: 82.9 % — AB (ref 38.4–76.8)
Platelets: 655 10*3/uL — ABNORMAL HIGH (ref 145–400)
RBC: 4.12 10*6/uL (ref 3.70–5.45)
RDW: 20.1 % — ABNORMAL HIGH (ref 11.2–14.5)
WBC: 4.1 10*3/uL (ref 3.9–10.3)

## 2014-03-22 MED ORDER — DENOSUMAB 120 MG/1.7ML ~~LOC~~ SOLN
120.0000 mg | Freq: Once | SUBCUTANEOUS | Status: AC
  Administered 2014-03-22: 120 mg via SUBCUTANEOUS
  Filled 2014-03-22: qty 1.7

## 2014-03-22 NOTE — Progress Notes (Signed)
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 one out of 13 lymph nodes positive ER 100%, PR 29%, HER-2 negative ratio 0.93 Ki-67 89%   09/26/2013 - 11/08/2013 Radiation Therapy Radiation to right chest wall and right supraclavicular region to a dose of 50.4 gray at 1.8 gray per fraction    Anti-estrogen oral therapy Patient refused antiestrogen therapy and chemotherapy, receives Blue crystal treatment from [place] (patch that sucks out impurities)   02/21/2014 PET scan Diffuse bone metastases involving the axial and appendicular skeleton, biopsy of the left iliac bone positive for metastatic breast cancer    CHIEF COMPLIANT: Metastatic breast cancer   INTERVAL HISTORY: [name] is a 58 year old Caucasian lady with above-mentioned history of right-sided breast cancer diagnosed June 2014 no results with metastatic disease to the bones October 2015. The patient started [place] and Femara one week ago. She has been tolerating this medication well. Her bone pain is about the same. She takes Percocet as needed for her bone pain. She averages approximately 1-2 Percocet per day.She denies chest pain, shortness of breath, abdominal pain. She had some intermittent nausea over the past week but none today. No rashes.  REVIEW OF SYSTEMS:   Constitutional: Denies fevers, chills or abnormal weight loss; bone  pain is improved since starting Percocet Eyes: Denies blurriness of vision Ears, nose, mouth, throat, and face: Denies mucositis or sore throat Respiratory: Denies cough, dyspnea or wheezes Cardiovascular: Denies palpitation, chest discomfort or lower extremity swelling Gastrointestinal:  Denies nausea, heartburn or change in bowel habits Skin: Denies abnormal skin rashes Lymphatics: Denies new lymphadenopathy or easy bruising Neurological:Denies numbness, tingling or new weaknesses Behavioral/Psych: Mood is stable, no new changes  Breast:  denies any pain or lumps or nodules in either breasts All other systems were reviewed with the patient and are negative.  I have reviewed the past medical history, past surgical history, social history and family history with the patient and they are unchanged from previous note.  ALLERGIES:  is allergic to penicillins.  MEDICATIONS:  Current Outpatient Prescriptions  Medication Sig Dispense Refill  . Calcium Carbonate-Vit D-Min (CALCIUM 1200 PO) Take 1 tablet by mouth every morning.    Marland Kitchen LORazepam (ATIVAN) 0.5 MG tablet May take 1-2 tablets every 8 hours as needed for restlessness. 30 tablet 0  . Multiple Vitamins-Minerals (MULTIVITAMIN PO) Take 1 tablet by mouth every morning.     Marland Kitchen OVER THE COUNTER MEDICATION Take 4 tablets by mouth every morning. Peptids supplements    . OVER THE COUNTER MEDICATION Take 4 tablets by mouth 2 (two) times daily. ([place])    . oxyCODONE-acetaminophen (PERCOCET/ROXICET) 5-325 MG per tablet 1-2 tabs every 6 hours as needed 30 tablet 0  . palbociclib (IBRANCE) 125 MG capsule Take 1 capsule (125 mg total) by mouth daily with  breakfast. Take whole with food. 21 capsule 3  . traMADol (ULTRAM) 50 MG tablet Take 50 mg by mouth every 6 (six) hours as needed for moderate pain.    Marland Kitchen UNABLE TO FIND [id number] Silicone Breast Prosthesis, Rt quantity 1 L8020 Mastectomy form, Rt, quantity 1 L8000 Mastectomy bra, quantity 6    .  UNABLE TO FIND 174.9 Malignant Neoplasm   Mastectomy Right  L8030-Silicone Breast Prosthesis-1  L8000- Mastectomy Bra-2 1 each 0   No current facility-administered medications for this visit.    PHYSICAL EXAMINATION: ECOG PERFORMANCE STATUS: 1 - Symptomatic but completely ambulatory  Filed Vitals:   03/22/14 1316  BP: 119/66  Pulse: 92  Temp: 98 F (36.7 C)  Resp: 18   Filed Weights   03/22/14 1316  Weight: 148 lb (67.132 kg)    GENERAL:alert, no distress and comfortable SKIN: skin color, texture, turgor are normal, no rashes or significant lesions EYES: normal, Conjunctiva are pink and non-injected, sclera clear OROPHARYNX:no exudate, no erythema and lips, buccal mucosa, and tongue normal  NECK: supple, thyroid normal size, non-tender, without nodularity LYMPH:  no palpable lymphadenopathy in the cervical, axillary or inguinal LUNGS: clear to auscultation and percussion with normal breathing effort HEART: regular rate & rhythm and no murmurs and no lower extremity edema ABDOMEN:abdomen soft, non-tender and normal bowel sounds Musculoskeletal:no cyanosis of digits and no clubbing  NEURO: alert & oriented x 3 with fluent speech, no focal motor/sensory deficits  LABORATORY DATA:  I have reviewed the data as listed   Chemistry      Component Value Date/Time   NA 138 03/22/2014 1252   NA 139 11/08/2012 0620   K 4.6 03/22/2014 1252   K 4.2 11/08/2012 0620   CL 105 11/08/2012 0620   CL 106 10/23/2012 1459   CO2 26 03/22/2014 1252   CO2 30 11/08/2012 0620   BUN 13.7 03/22/2014 1252   BUN 8 11/08/2012 0620   CREATININE 0.9 03/22/2014 1252   CREATININE 0.74 11/08/2012 0620   CREATININE 0.82 08/18/2011 1056      Component Value Date/Time   CALCIUM 10.3 03/22/2014 1252   CALCIUM 9.0 11/08/2012 0620   ALKPHOS 642* 03/22/2014 1252   ALKPHOS 96 11/06/2012 0850   AST 63* 03/22/2014 1252   AST 15 11/06/2012 0850   ALT 63* 03/22/2014 1252   ALT 14 11/06/2012 0850    BILITOT 0.41 03/22/2014 1252   BILITOT 0.4 11/06/2012 0850       Lab Results  Component Value Date   WBC 4.1 03/22/2014   HGB 9.6* 03/22/2014   HCT 30.6* 03/22/2014   MCV 74.3* 03/22/2014   PLT 655* 03/22/2014   NEUTROABS 3.4 03/22/2014     RADIOGRAPHIC STUDIES: I have personally reviewed the radiology reports and agreed with their findings. No results found.   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  She is tolerating Ibrance and letrozole well. CBC was reviewed with her in detail. I have recommended that she continue these medications. She will return to the cancer center weekly for CBC.in consultation with Dr. Jana Hakim today, we have initiated Xgeva per Dr. Geralyn Flash last note.side effects of this medication have been discussed with the patient including arthralgias, hypocalcemia, and possible osteonecrosis of the  jaw. The patient has seen her dentist recently and has no current dental issues. She was instructed take TUMS 1 tablet 4 times a day for 2 days and then continue her calcium supplement as she is already taking.  Return to clinic weekly for CBC and 2 weeks for a visit.    Orders Placed This Encounter  Procedures  . CBC with Differential    Standing Status: Standing     Number of Occurrences: 3     Standing Expiration Date: 03/23/2015  . Comprehensive metabolic panel    Standing Status: Future     Number of Occurrences:      Standing Expiration Date: 03/22/2015  . SCHEDULING COMMUNICATION    Schedule 15 minute injection appointment   The patient has a good understanding of the overall plan. she agrees with it. She will call with any problems  that may develop before her next visit here.  I spent 20 minutes counseling the patient face to face. The total time spent in the appointment was 30 minutes and more than 50% was on counseling and review of test results    Mikey Bussing, NP 03/22/2014 4:09 PM

## 2014-03-22 NOTE — Patient Instructions (Signed)
Denosumab injection What is this medicine? DENOSUMAB (den oh sue mab) slows bone breakdown. Prolia is used to treat osteoporosis in women after menopause and in men. Xgeva is used to prevent bone fractures and other bone problems caused by cancer bone metastases. Xgeva is also used to treat giant cell tumor of the bone. This medicine may be used for other purposes; ask your health care provider or pharmacist if you have questions. COMMON BRAND NAME(S): Prolia, XGEVA What should I tell my health care provider before I take this medicine? They need to know if you have any of these conditions: -dental disease -eczema -infection or history of infections -kidney disease or on dialysis -low blood calcium or vitamin D -malabsorption syndrome -scheduled to have surgery or tooth extraction -taking medicine that contains denosumab -thyroid or parathyroid disease -an unusual reaction to denosumab, other medicines, foods, dyes, or preservatives -pregnant or trying to get pregnant -breast-feeding How should I use this medicine? This medicine is for injection under the skin. It is given by a health care professional in a hospital or clinic setting. If you are getting Prolia, a special MedGuide will be given to you by the pharmacist with each prescription and refill. Be sure to read this information carefully each time. For Prolia, talk to your pediatrician regarding the use of this medicine in children. Special care may be needed. For Xgeva, talk to your pediatrician regarding the use of this medicine in children. While this drug may be prescribed for children as young as 13 years for selected conditions, precautions do apply. Overdosage: If you think you've taken too much of this medicine contact a poison control center or emergency room at once. Overdosage: If you think you have taken too much of this medicine contact a poison control center or emergency room at once. NOTE: This medicine is only for  you. Do not share this medicine with others. What if I miss a dose? It is important not to miss your dose. Call your doctor or health care professional if you are unable to keep an appointment. What may interact with this medicine? Do not take this medicine with any of the following medications: -other medicines containing denosumab This medicine may also interact with the following medications: -medicines that suppress the immune system -medicines that treat cancer -steroid medicines like prednisone or cortisone This list may not describe all possible interactions. Give your health care provider a list of all the medicines, herbs, non-prescription drugs, or dietary supplements you use. Also tell them if you smoke, drink alcohol, or use illegal drugs. Some items may interact with your medicine. What should I watch for while using this medicine? Visit your doctor or health care professional for regular checks on your progress. Your doctor or health care professional may order blood tests and other tests to see how you are doing. Call your doctor or health care professional if you get a cold or other infection while receiving this medicine. Do not treat yourself. This medicine may decrease your body's ability to fight infection. You should make sure you get enough calcium and vitamin D while you are taking this medicine, unless your doctor tells you not to. Discuss the foods you eat and the vitamins you take with your health care professional. See your dentist regularly. Brush and floss your teeth as directed. Before you have any dental work done, tell your dentist you are receiving this medicine. Do not become pregnant while taking this medicine or for 5 months after stopping   it. Women should inform their doctor if they wish to become pregnant or think they might be pregnant. There is a potential for serious side effects to an unborn child. Talk to your health care professional or pharmacist for more  information. What side effects may I notice from receiving this medicine? Side effects that you should report to your doctor or health care professional as soon as possible: -allergic reactions like skin rash, itching or hives, swelling of the face, lips, or tongue -breathing problems -chest pain -fast, irregular heartbeat -feeling faint or lightheaded, falls -fever, chills, or any other sign of infection -muscle spasms, tightening, or twitches -numbness or tingling -skin blisters or bumps, or is dry, peels, or red -slow healing or unexplained pain in the mouth or jaw -unusual bleeding or bruising Side effects that usually do not require medical attention (Report these to your doctor or health care professional if they continue or are bothersome.): -muscle pain -stomach upset, gas This list may not describe all possible side effects. Call your doctor for medical advice about side effects. You may report side effects to FDA at 1-800-FDA-1088. Where should I keep my medicine? This medicine is only given in a clinic, doctor's office, or other health care setting and will not be stored at home. NOTE: This sheet is a summary. It may not cover all possible information. If you have questions about this medicine, talk to your doctor, pharmacist, or health care provider.  2015, Elsevier/Gold Standard. (2011-11-01 12:37:47)  

## 2014-03-22 NOTE — Telephone Encounter (Signed)
Gave avs & cal for Nov.  °

## 2014-03-22 NOTE — Assessment & Plan Note (Signed)
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  She is tolerating Ibrance and letrozole well. CBC was reviewed with her in detail. I have recommended that she continue these medications. She will return to the cancer center weekly for CBC.in consultation with Dr. Jana Hakim today, we have initiated Xgeva per Dr. Geralyn Flash last note.side effects of this medication have been discussed with the patient including arthralgias, hypocalcemia, and possible osteonecrosis of the jaw. The patient has seen her dentist recently and has no current dental issues. She was instructed take TUMS 1 tablet 4 times a day for 2 days and then continue her calcium supplement as she is already taking.  Return to clinic weekly for CBC and 2 weeks for a visit.

## 2014-03-29 ENCOUNTER — Other Ambulatory Visit: Payer: Self-pay | Admitting: Hematology and Oncology

## 2014-03-29 ENCOUNTER — Other Ambulatory Visit (HOSPITAL_BASED_OUTPATIENT_CLINIC_OR_DEPARTMENT_OTHER): Payer: 59

## 2014-03-29 ENCOUNTER — Encounter: Payer: Self-pay | Admitting: *Deleted

## 2014-03-29 DIAGNOSIS — C7951 Secondary malignant neoplasm of bone: Secondary | ICD-10-CM

## 2014-03-29 DIAGNOSIS — C50119 Malignant neoplasm of central portion of unspecified female breast: Secondary | ICD-10-CM

## 2014-03-29 DIAGNOSIS — C50111 Malignant neoplasm of central portion of right female breast: Secondary | ICD-10-CM

## 2014-03-29 LAB — CBC WITH DIFFERENTIAL/PLATELET
BASO%: 1 % (ref 0.0–2.0)
BASOS ABS: 0 10*3/uL (ref 0.0–0.1)
EOS ABS: 0 10*3/uL (ref 0.0–0.5)
EOS%: 0.8 % (ref 0.0–7.0)
HEMATOCRIT: 31.8 % — AB (ref 34.8–46.6)
HGB: 9.8 g/dL — ABNORMAL LOW (ref 11.6–15.9)
LYMPH%: 39 % (ref 14.0–49.7)
MCH: 23.2 pg — ABNORMAL LOW (ref 25.1–34.0)
MCHC: 30.7 g/dL — ABNORMAL LOW (ref 31.5–36.0)
MCV: 75.4 fL — ABNORMAL LOW (ref 79.5–101.0)
MONO#: 0.1 10*3/uL (ref 0.1–0.9)
MONO%: 2.8 % (ref 0.0–14.0)
NEUT%: 56.4 % (ref 38.4–76.8)
NEUTROS ABS: 1.1 10*3/uL — AB (ref 1.5–6.5)
Platelets: 368 10*3/uL (ref 145–400)
RBC: 4.21 10*6/uL (ref 3.70–5.45)
RDW: 20.3 % — ABNORMAL HIGH (ref 11.2–14.5)
WBC: 1.9 10*3/uL — AB (ref 3.9–10.3)
lymph#: 0.7 10*3/uL — ABNORMAL LOW (ref 0.9–3.3)

## 2014-03-29 MED ORDER — PALBOCICLIB 100 MG PO CAPS: 100.0000 mg | ORAL_CAPSULE | Freq: Every day | ORAL | Status: DC

## 2014-03-29 NOTE — Progress Notes (Signed)
[place] Social Work  Clinical Social Work was referred by Therapist, sports to review and complete healthcare advance directives.  Clinical Social Worker met with patient in [place] office.  The patient designated daughter Harmon Pier as their primary healthcare agent and son Barnabas Lister as their secondary agent.  Patient also completed healthcare living will.    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  Clinical Social Worker notarized documents and made copies for patient/family. Clinical Social Worker will send documents to medical records to be scanned into patient's chart. Clinical Social Worker encouraged patient/family to contact with any additional questions or concerns.  Polo Riley, MSW, [place] Worker [facility] [phone]

## 2014-04-05 ENCOUNTER — Other Ambulatory Visit (HOSPITAL_BASED_OUTPATIENT_CLINIC_OR_DEPARTMENT_OTHER): Payer: 59

## 2014-04-05 ENCOUNTER — Ambulatory Visit (HOSPITAL_BASED_OUTPATIENT_CLINIC_OR_DEPARTMENT_OTHER): Payer: 59 | Admitting: Hematology and Oncology

## 2014-04-05 ENCOUNTER — Telehealth: Payer: Self-pay | Admitting: Hematology and Oncology

## 2014-04-05 DIAGNOSIS — C50111 Malignant neoplasm of central portion of right female breast: Secondary | ICD-10-CM

## 2014-04-05 DIAGNOSIS — Z170 Estrogen receptor positive status [ER+]: Secondary | ICD-10-CM

## 2014-04-05 DIAGNOSIS — C50919 Malignant neoplasm of unspecified site of unspecified female breast: Secondary | ICD-10-CM

## 2014-04-05 DIAGNOSIS — D709 Neutropenia, unspecified: Secondary | ICD-10-CM

## 2014-04-05 LAB — CBC WITH DIFFERENTIAL/PLATELET
BASO%: 1.7 % (ref 0.0–2.0)
Basophils Absolute: 0 10*3/uL (ref 0.0–0.1)
EOS%: 0.6 % (ref 0.0–7.0)
Eosinophils Absolute: 0 10*3/uL (ref 0.0–0.5)
HEMATOCRIT: 33 % — AB (ref 34.8–46.6)
HEMOGLOBIN: 10.1 g/dL — AB (ref 11.6–15.9)
LYMPH#: 1 10*3/uL (ref 0.9–3.3)
LYMPH%: 56.2 % — AB (ref 14.0–49.7)
MCH: 24.1 pg — AB (ref 25.1–34.0)
MCHC: 30.6 g/dL — ABNORMAL LOW (ref 31.5–36.0)
MCV: 78.8 fL — AB (ref 79.5–101.0)
MONO#: 0.1 10*3/uL (ref 0.1–0.9)
MONO%: 2.8 % (ref 0.0–14.0)
NEUT%: 38.7 % (ref 38.4–76.8)
NEUTROS ABS: 0.7 10*3/uL — AB (ref 1.5–6.5)
Platelets: 201 10*3/uL (ref 145–400)
RBC: 4.19 10*6/uL (ref 3.70–5.45)
RDW: 22 % — ABNORMAL HIGH (ref 11.2–14.5)
WBC: 1.8 10*3/uL — ABNORMAL LOW (ref 3.9–10.3)

## 2014-04-05 LAB — COMPREHENSIVE METABOLIC PANEL (CC13)
ALK PHOS: 432 U/L — AB (ref 40–150)
ALT: 48 U/L (ref 0–55)
ANION GAP: 11 meq/L (ref 3–11)
AST: 39 U/L — ABNORMAL HIGH (ref 5–34)
Albumin: 3.2 g/dL — ABNORMAL LOW (ref 3.5–5.0)
BUN: 16.4 mg/dL (ref 7.0–26.0)
CO2: 25 meq/L (ref 22–29)
Calcium: 8.5 mg/dL (ref 8.4–10.4)
Chloride: 108 mEq/L (ref 98–109)
Creatinine: 1 mg/dL (ref 0.6–1.1)
Glucose: 140 mg/dl (ref 70–140)
Potassium: 4.1 mEq/L (ref 3.5–5.1)
Sodium: 143 mEq/L (ref 136–145)
TOTAL PROTEIN: 7.3 g/dL (ref 6.4–8.3)
Total Bilirubin: 0.32 mg/dL (ref 0.20–1.20)

## 2014-04-05 NOTE — Progress Notes (Signed)
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   11/09/2012 Surgery 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 one out of 13 lymph nodes positive ER 100%, PR 29%, HER-2 negative ratio 0.93 Ki-67 89%   09/26/2013 - 11/08/2013 Radiation Therapy Radiation to right chest wall and right supraclavicular region to a dose of 50.4 gray at 1.8 gray per fraction    Anti-estrogen oral therapy Patient refused antiestrogen therapy and chemotherapy, receives Blue crystal treatment from [place] (patch that sucks out impurities)   02/21/2014 PET scan Diffuse bone metastases involving the axial and appendicular skeleton, biopsy of the left iliac bone positive for metastatic breast cancer   03/12/2014 Treatment Plan Change Ibrance with letrozole and Xgeva     CHIEF COMPLIANT: followup on it comes on letrozole  INTERVAL HISTORY: [name] is a 58 year old Caucasian with above-mentioned history of metastatic breast cancer currently on high-dose with letrozole with Xgeva. She has been tolerating it runs fairly well except for anticipated 3 nausea that she gets every time she takes it. She does not have any major hot flashes or muscle aches or pains related to letrozole. She receives [place] without any major problems as well. She denies any fevers or chills.  REVIEW OF SYSTEMS:    Constitutional: Denies fevers, chills or abnormal weight loss Eyes: Denies blurriness of vision Ears, nose, mouth, throat, and face: Denies mucositis or sore throat Respiratory: Denies cough, dyspnea or wheezes Cardiovascular: Denies palpitation, chest discomfort or lower extremity swelling Gastrointestinal:  nausea,  Denies heartburn or change in bowel habits Skin: Denies abnormal skin rashes Lymphatics: Denies new lymphadenopathy or easy bruising Neurological:Denies numbness, tingling or new weaknesses Behavioral/Psych: Mood is stable, no new changes  All other systems were reviewed with the patient and are negative.  I have reviewed the past medical history, past surgical history, social history and family history with the patient and they are unchanged from previous note.  ALLERGIES:  is allergic to penicillins.  MEDICATIONS:  Current Outpatient Prescriptions  Medication Sig Dispense Refill  . Calcium Carbonate-Vit D-Min (CALCIUM 1200 PO) Take 1 tablet by mouth every morning.    Marland Kitchen LORazepam (ATIVAN) 0.5 MG tablet May take 1-2 tablets every 8 hours as needed for restlessness. 30 tablet 0  . Multiple Vitamins-Minerals (MULTIVITAMIN PO) Take 1 tablet by mouth every morning.     Marland Kitchen OVER THE COUNTER MEDICATION Take 4 tablets by mouth every morning. Peptids supplements    . OVER THE COUNTER MEDICATION Take 4 tablets by mouth 2 (two) times daily. ([place] Tail)    . oxyCODONE-acetaminophen (PERCOCET/ROXICET) 5-325 MG per tablet 1-2 tabs every 6 hours as needed 30 tablet 0  . palbociclib (IBRANCE) 100 MG capsule Take 1 capsule (100 mg total) by mouth daily with breakfast. Take whole with food. 21 capsule 3  . traMADol (ULTRAM) 50 MG tablet  Take 50 mg by mouth every 6 (six) hours as needed for moderate pain.    Marland Kitchen UNABLE TO FIND Z3299 Silicone Breast Prosthesis, Rt quantity 1 L8020 Mastectomy form, Rt, quantity 1 L8000 Mastectomy bra, quantity 6    . UNABLE TO FIND 174.9 Malignant Neoplasm    Mastectomy Right  L8030-Silicone Breast Prosthesis-1  L8000- Mastectomy Bra-2 1 each 0   No current facility-administered medications for this visit.    PHYSICAL EXAMINATION: ECOG PERFORMANCE STATUS: 1 - Symptomatic but completely ambulatory  Filed Vitals:   04/05/14 1023  BP: 121/58  Pulse: 94  Temp: 98.1 F (36.7 C)  Resp: 18   Filed Weights   04/05/14 1023  Weight: 147 lb 8 oz (66.906 kg)    GENERAL:alert, no distress and comfortable SKIN: skin color, texture, turgor are normal, no rashes or significant lesions EYES: normal, Conjunctiva are pink and non-injected, sclera clear OROPHARYNX:no exudate, no erythema and lips, buccal mucosa, and tongue normal  NECK: supple, thyroid normal size, non-tender, without nodularity LYMPH:  no palpable lymphadenopathy in the cervical, axillary or inguinal LUNGS: clear to auscultation and percussion with normal breathing effort HEART: regular rate & rhythm and no murmurs and no lower extremity edema ABDOMEN:abdomen soft, non-tender and normal bowel sounds Musculoskeletal:no cyanosis of digits and no clubbing  NEURO: alert & oriented x 3 with fluent speech, no focal motor/sensory deficits  LABORATORY DATA:  I have reviewed the data as listed   Chemistry      Component Value Date/Time   NA 143 04/05/2014 1015   NA 139 11/08/2012 0620   K 4.1 04/05/2014 1015   K 4.2 11/08/2012 0620   CL 105 11/08/2012 0620   CL 106 10/23/2012 1459   CO2 25 04/05/2014 1015   CO2 30 11/08/2012 0620   BUN 16.4 04/05/2014 1015   BUN 8 11/08/2012 0620   CREATININE 1.0 04/05/2014 1015   CREATININE 0.74 11/08/2012 0620   CREATININE 0.82 08/18/2011 1056      Component Value Date/Time   CALCIUM 8.5 04/05/2014 1015   CALCIUM 9.0 11/08/2012 0620   ALKPHOS 432* 04/05/2014 1015   ALKPHOS 96 11/06/2012 0850   AST 39* 04/05/2014 1015   AST 15 11/06/2012 0850   ALT 48 04/05/2014 1015   ALT 14 11/06/2012 0850   BILITOT 0.32 04/05/2014 1015    BILITOT 0.4 11/06/2012 0850       Lab Results  Component Value Date   WBC 1.8* 04/05/2014   HGB 10.1* 04/05/2014   HCT 33.0* 04/05/2014   MCV 78.8* 04/05/2014   PLT 201 04/05/2014   NEUTROABS 0.7* 04/05/2014     ASSESSMENT & PLAN:  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  Patient is tolerating Ibrance and letrozole fairly well Toxicities of Ibrance: 1. Nausea improved with Ginger herbal supplements 2. Fatigue 3. Neutropenia ANC 0.7: Provider neutropenic precautions  Plan: Decrease the dosage of Ibrance to 100 mg daily followup in 2 weeks with lab work. Patient will get Delton See once a month she had received the first injection 2 weeks ago. She does not have an appointment in the computer. I will request an appointment for her.  Monitoring closely for toxicities.   Orders Placed This Encounter  Procedures  . CBC with Differential  Standing Status: Future     Number of Occurrences:      Standing Expiration Date: 04/05/2015  . Comprehensive metabolic panel (Cmet) - [facility]    Standing Status: Future     Number of Occurrences:      Standing Expiration Date: 04/05/2015   The patient has a good understanding of the overall plan. she agrees with it. She will call with any problems that may develop before her next visit here.    Rulon Eisenmenger, MD 04/05/2014 11:47 AM

## 2014-04-05 NOTE — Assessment & Plan Note (Signed)
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  Patient is tolerating Ibrance and letrozole fairly well Toxicities of Ibrance: 1. Nausea improved with Ginger herbal supplements 2. Fatigue 3. Neutropenia ANC 0.7  Plan: Decrease the dosage of picograms to 100 mg daily followup in 2 weeks with lab work. Patient will get Delton See once a month she had received the first injection 2 weeks ago. She does not have an appointment in the computer. I will request an appointment for her.  Monitoring closely for toxicities.

## 2014-04-12 ENCOUNTER — Other Ambulatory Visit: Payer: 59

## 2014-04-19 ENCOUNTER — Other Ambulatory Visit (HOSPITAL_BASED_OUTPATIENT_CLINIC_OR_DEPARTMENT_OTHER): Payer: 59

## 2014-04-19 ENCOUNTER — Ambulatory Visit (HOSPITAL_BASED_OUTPATIENT_CLINIC_OR_DEPARTMENT_OTHER): Payer: 59

## 2014-04-19 ENCOUNTER — Ambulatory Visit (HOSPITAL_BASED_OUTPATIENT_CLINIC_OR_DEPARTMENT_OTHER): Payer: 59 | Admitting: Hematology and Oncology

## 2014-04-19 ENCOUNTER — Telehealth: Payer: Self-pay | Admitting: Hematology and Oncology

## 2014-04-19 VITALS — BP 129/66 | HR 89 | Temp 98.4°F | Resp 19 | Ht 65.0 in | Wt 150.2 lb

## 2014-04-19 DIAGNOSIS — C7951 Secondary malignant neoplasm of bone: Secondary | ICD-10-CM

## 2014-04-19 DIAGNOSIS — C50111 Malignant neoplasm of central portion of right female breast: Secondary | ICD-10-CM

## 2014-04-19 DIAGNOSIS — C50919 Malignant neoplasm of unspecified site of unspecified female breast: Secondary | ICD-10-CM

## 2014-04-19 DIAGNOSIS — Z170 Estrogen receptor positive status [ER+]: Secondary | ICD-10-CM

## 2014-04-19 LAB — CBC WITH DIFFERENTIAL/PLATELET
BASO%: 3.8 % — ABNORMAL HIGH (ref 0.0–2.0)
Basophils Absolute: 0.1 10e3/uL (ref 0.0–0.1)
EOS%: 0.5 % (ref 0.0–7.0)
Eosinophils Absolute: 0 10e3/uL (ref 0.0–0.5)
HCT: 30.2 % — ABNORMAL LOW (ref 34.8–46.6)
HGB: 9.6 g/dL — ABNORMAL LOW (ref 11.6–15.9)
LYMPH%: 44.9 % (ref 14.0–49.7)
MCH: 26 pg (ref 25.1–34.0)
MCHC: 31.8 g/dL (ref 31.5–36.0)
MCV: 81.8 fL (ref 79.5–101.0)
MONO#: 0.1 10e3/uL (ref 0.1–0.9)
MONO%: 6.5 % (ref 0.0–14.0)
NEUT#: 0.8 10e3/uL — ABNORMAL LOW (ref 1.5–6.5)
NEUT%: 44.3 % (ref 38.4–76.8)
Platelets: 275 10e3/uL (ref 145–400)
RBC: 3.69 10e6/uL — ABNORMAL LOW (ref 3.70–5.45)
RDW: 28.7 % — ABNORMAL HIGH (ref 11.2–14.5)
WBC: 1.9 10e3/uL — ABNORMAL LOW (ref 3.9–10.3)
lymph#: 0.8 10e3/uL — ABNORMAL LOW (ref 0.9–3.3)
nRBC: 1 % — ABNORMAL HIGH (ref 0–0)

## 2014-04-19 LAB — COMPREHENSIVE METABOLIC PANEL (CC13)
ALT: 21 U/L (ref 0–55)
AST: 24 U/L (ref 5–34)
Albumin: 3.4 g/dL — ABNORMAL LOW (ref 3.5–5.0)
Alkaline Phosphatase: 391 U/L — ABNORMAL HIGH (ref 40–150)
Anion Gap: 10 meq/L (ref 3–11)
BUN: 18.1 mg/dL (ref 7.0–26.0)
CO2: 21 meq/L — ABNORMAL LOW (ref 22–29)
Calcium: 8.6 mg/dL (ref 8.4–10.4)
Chloride: 111 meq/L — ABNORMAL HIGH (ref 98–109)
Creatinine: 0.8 mg/dL (ref 0.6–1.1)
EGFR: 78 ml/min/1.73 m2 — ABNORMAL LOW
Glucose: 93 mg/dL (ref 70–140)
Potassium: 4.5 meq/L (ref 3.5–5.1)
Sodium: 142 meq/L (ref 136–145)
Total Bilirubin: 0.22 mg/dL (ref 0.20–1.20)
Total Protein: 6.9 g/dL (ref 6.4–8.3)

## 2014-04-19 MED ORDER — DENOSUMAB 120 MG/1.7ML ~~LOC~~ SOLN
120.0000 mg | Freq: Once | SUBCUTANEOUS | Status: AC
  Administered 2014-04-19: 120 mg via SUBCUTANEOUS
  Filled 2014-04-19: qty 1.7

## 2014-04-19 NOTE — Progress Notes (Signed)
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   Staging form: Breast, AJCC 7th Edition     Clinical: Stage IIB (T2, N1, M1) - Signed by Sabas Sous, MD on 03/13/2014       Prognostic indicators: Estrogen receptor positive Progesterone receptor positive HER-2/neu negative Ki-67 83/89%      Pathologic: No stage assigned - Unsigned       Prognostic indicators: Estrogen receptor positive Progesterone receptor positive HER-2/neu negative Ki-67 83/89%    SUMMARY OF ONCOLOGIC HISTORY:   Cancer of central portion of right female breast   11/09/2012 Surgery 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 one out of 13 lymph nodes positive ER 100%, PR 29%, HER-2 negative ratio 0.93 Ki-67 89%   09/26/2013 - 11/08/2013 Radiation Therapy Radiation to right chest wall and right supraclavicular region to a dose of 50.4 gray at 1.8 gray per fraction    Anti-estrogen oral therapy Patient refused antiestrogen therapy and chemotherapy, receives Blue crystal treatment from [place] (patch that sucks out impurities)   02/21/2014 PET scan Diffuse bone metastases involving the axial and appendicular skeleton, biopsy of the left iliac bone positive for metastatic breast cancer   03/12/2014 Treatment Plan Change Ibrance with letrozole and Xgeva     CHIEF COMPLIANT: Followup on letrozole but Ibrance  INTERVAL HISTORY: [name] is a 58-year-old Caucasian with above-mentioned history of metastatic breast cancer is currently on treatment with Ibrance with letrozole and Xgeva. She appears to be tolerating the treatment extremely well without any major side effects other than neutropenia related to the treatment. We decreased the dosage of it grams from 125-100 mg and she is here today to do blood work and followup. She has not noticed any major difference other than the fatigue is  slightly better.denies any fevers or chills  REVIEW OF SYSTEMS:   Constitutional: Denies fevers, chills or abnormal weight loss Eyes: Denies blurriness of vision Ears, nose, mouth, throat, and face: Denies mucositis or sore throat Respiratory: Denies cough, dyspnea or wheezes Cardiovascular: Denies palpitation, chest discomfort or lower extremity swelling Gastrointestinal:  Denies nausea, heartburn or change in bowel habits Skin: Denies abnormal skin rashes Lymphatics: Denies new lymphadenopathy or easy bruising Neurological:Denies numbness, tingling or new weaknesses Behavioral/Psych: Mood is stable, no new changes  All other systems were reviewed with the patient and are negative.  I have reviewed the past medical history, past surgical history, social history and family history with the patient and they are unchanged from previous note.  ALLERGIES:  is allergic to penicillins.  MEDICATIONS:  Current Outpatient Prescriptions  Medication Sig Dispense Refill  . Calcium Carbonate-Vit D-Min (CALCIUM 1200 PO) Take 1 tablet by mouth every morning.    Marland Kitchen LORazepam (ATIVAN) 0.5 MG tablet May take 1-2 tablets every 8 hours as needed for restlessness. 30 tablet 0  . Multiple Vitamins-Minerals (MULTIVITAMIN PO) Take 1 tablet by mouth every morning.     Marland Kitchen OVER THE COUNTER MEDICATION Take 4 tablets by mouth every morning. Peptids supplements    . OVER THE COUNTER MEDICATION Take 4 tablets by mouth 2 (two) times daily. ([place] Tail)    . oxyCODONE-acetaminophen (PERCOCET/ROXICET) 5-325 MG per tablet 1-2 tabs every 6 hours as needed 30 tablet 0  . palbociclib (IBRANCE) 100 MG capsule Take 1 capsule (100 mg total) by mouth daily with breakfast. Take whole with food. 21  capsule 3  . traMADol (ULTRAM) 50 MG tablet Take 50 mg by mouth every 6 (six) hours as needed for moderate pain.    Marland Kitchen UNABLE TO FIND [id number] Silicone Breast Prosthesis, Rt quantity 1 L8020 Mastectomy form, Rt, quantity 1 L8000  Mastectomy bra, quantity 6    . UNABLE TO FIND 174.9 Malignant Neoplasm   Mastectomy Right  L8030-Silicone Breast Prosthesis-1  L8000- Mastectomy Bra-2 1 each 0   No current facility-administered medications for this visit.    PHYSICAL EXAMINATION: ECOG PERFORMANCE STATUS: 0 - Asymptomatic  Filed Vitals:   04/19/14 1238  BP: 129/66  Pulse: 89  Temp: 98.4 F (36.9 C)  Resp: 19   Filed Weights   04/19/14 1238  Weight: 150 lb 3.2 oz (68.13 kg)    GENERAL:alert, no distress and comfortable SKIN: skin color, texture, turgor are normal, no rashes or significant lesions EYES: normal, Conjunctiva are pink and non-injected, sclera clear OROPHARYNX:no exudate, no erythema and lips, buccal mucosa, and tongue normal  NECK: supple, thyroid normal size, non-tender, without nodularity LYMPH:  no palpable lymphadenopathy in the cervical, axillary or inguinal LUNGS: clear to auscultation and percussion with normal breathing effort HEART: regular rate & rhythm and no murmurs and no lower extremity edema ABDOMEN:abdomen soft, non-tender and normal bowel sounds Musculoskeletal:no cyanosis of digits and no clubbing  NEURO: alert & oriented x 3 with fluent speech, no focal motor/sensory deficits  LABORATORY DATA:  I have reviewed the data as listed   Chemistry      Component Value Date/Time   NA 143 04/05/2014 1015   NA 139 11/08/2012 0620   K 4.1 04/05/2014 1015   K 4.2 11/08/2012 0620   CL 105 11/08/2012 0620   CL 106 10/23/2012 1459   CO2 25 04/05/2014 1015   CO2 30 11/08/2012 0620   BUN 16.4 04/05/2014 1015   BUN 8 11/08/2012 0620   CREATININE 1.0 04/05/2014 1015   CREATININE 0.74 11/08/2012 0620   CREATININE 0.82 08/18/2011 1056      Component Value Date/Time   CALCIUM 8.5 04/05/2014 1015   CALCIUM 9.0 11/08/2012 0620   ALKPHOS 432* 04/05/2014 1015   ALKPHOS 96 11/06/2012 0850   AST 39* 04/05/2014 1015   AST 15 11/06/2012 0850   ALT 48 04/05/2014 1015   ALT 14  11/06/2012 0850   BILITOT 0.32 04/05/2014 1015   BILITOT 0.4 11/06/2012 0850       Lab Results  Component Value Date   WBC 1.8* 04/05/2014   HGB 10.1* 04/05/2014   HCT 33.0* 04/05/2014   MCV 78.8* 04/05/2014   PLT 201 04/05/2014   NEUTROABS 0.7* 04/05/2014     RADIOGRAPHIC STUDIES: I have personally reviewed the radiology reports and agreed with their findings. No results found.   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  Patient is tolerating Ibrance and letrozole fairly well after reducing the dosage of Ibrance to 100 mg daily Toxicities of Ibrance: 1. Nausea improved with Ginger herbal supplements 2. Fatigue: much improved lower dosage  3. Neutropenia: We did use the dosage to 100 mg of Ibrance her ANC had improved from 0.7-0.86. I discussed with her that we will continue the same dosage and she will come back to see  [place] in one month for followup and to repeat blood work. Discussed with her about neutropenic precautions. Instructed her to call us if she develops any fevers. 3. anemia  Bone metastases: Xgeva be given today Monitoring closely for toxicities.    Orders Placed This Encounter  Procedures  . CBC with Differential    Standing Status: Future     Number of Occurrences:      Standing Expiration Date: 04/19/2015   The patient has a good understanding of the overall plan. she agrees with it. She will call with any problems that may develop before her next visit here.   Rulon Eisenmenger, MD 04/19/2014 1:05 PM

## 2014-04-19 NOTE — Patient Instructions (Signed)
Denosumab injection What is this medicine? DENOSUMAB (den oh sue mab) slows bone breakdown. Prolia is used to treat osteoporosis in women after menopause and in men. Xgeva is used to prevent bone fractures and other bone problems caused by cancer bone metastases. Xgeva is also used to treat giant cell tumor of the bone. This medicine may be used for other purposes; ask your health care provider or pharmacist if you have questions. COMMON BRAND NAME(S): Prolia, XGEVA What should I tell my health care provider before I take this medicine? They need to know if you have any of these conditions: -dental disease -eczema -infection or history of infections -kidney disease or on dialysis -low blood calcium or vitamin D -malabsorption syndrome -scheduled to have surgery or tooth extraction -taking medicine that contains denosumab -thyroid or parathyroid disease -an unusual reaction to denosumab, other medicines, foods, dyes, or preservatives -pregnant or trying to get pregnant -breast-feeding How should I use this medicine? This medicine is for injection under the skin. It is given by a health care professional in a hospital or clinic setting. If you are getting Prolia, a special MedGuide will be given to you by the pharmacist with each prescription and refill. Be sure to read this information carefully each time. For Prolia, talk to your pediatrician regarding the use of this medicine in children. Special care may be needed. For Xgeva, talk to your pediatrician regarding the use of this medicine in children. While this drug may be prescribed for children as young as 13 years for selected conditions, precautions do apply. Overdosage: If you think you've taken too much of this medicine contact a poison control center or emergency room at once. Overdosage: If you think you have taken too much of this medicine contact a poison control center or emergency room at once. NOTE: This medicine is only for  you. Do not share this medicine with others. What if I miss a dose? It is important not to miss your dose. Call your doctor or health care professional if you are unable to keep an appointment. What may interact with this medicine? Do not take this medicine with any of the following medications: -other medicines containing denosumab This medicine may also interact with the following medications: -medicines that suppress the immune system -medicines that treat cancer -steroid medicines like prednisone or cortisone This list may not describe all possible interactions. Give your health care provider a list of all the medicines, herbs, non-prescription drugs, or dietary supplements you use. Also tell them if you smoke, drink alcohol, or use illegal drugs. Some items may interact with your medicine. What should I watch for while using this medicine? Visit your doctor or health care professional for regular checks on your progress. Your doctor or health care professional may order blood tests and other tests to see how you are doing. Call your doctor or health care professional if you get a cold or other infection while receiving this medicine. Do not treat yourself. This medicine may decrease your body's ability to fight infection. You should make sure you get enough calcium and vitamin D while you are taking this medicine, unless your doctor tells you not to. Discuss the foods you eat and the vitamins you take with your health care professional. See your dentist regularly. Brush and floss your teeth as directed. Before you have any dental work done, tell your dentist you are receiving this medicine. Do not become pregnant while taking this medicine or for 5 months after stopping   it. Women should inform their doctor if they wish to become pregnant or think they might be pregnant. There is a potential for serious side effects to an unborn child. Talk to your health care professional or pharmacist for more  information. What side effects may I notice from receiving this medicine? Side effects that you should report to your doctor or health care professional as soon as possible: -allergic reactions like skin rash, itching or hives, swelling of the face, lips, or tongue -breathing problems -chest pain -fast, irregular heartbeat -feeling faint or lightheaded, falls -fever, chills, or any other sign of infection -muscle spasms, tightening, or twitches -numbness or tingling -skin blisters or bumps, or is dry, peels, or red -slow healing or unexplained pain in the mouth or jaw -unusual bleeding or bruising Side effects that usually do not require medical attention (Report these to your doctor or health care professional if they continue or are bothersome.): -muscle pain -stomach upset, gas This list may not describe all possible side effects. Call your doctor for medical advice about side effects. You may report side effects to FDA at 1-800-FDA-1088. Where should I keep my medicine? This medicine is only given in a clinic, doctor's office, or other health care setting and will not be stored at home. NOTE: This sheet is a summary. It may not cover all possible information. If you have questions about this medicine, talk to your doctor, pharmacist, or health care provider.  2015, Elsevier/Gold Standard. (2011-11-01 12:37:47)  

## 2014-04-19 NOTE — Telephone Encounter (Signed)
, °

## 2014-04-19 NOTE — Assessment & Plan Note (Addendum)
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  Patient is tolerating Ibrance and letrozole fairly well after reducing the dosage of Ibrance to 100 mg daily Toxicities of Ibrance: 1. Nausea improved with Ginger herbal supplements 2. Fatigue: much improved lower dosage  3. Neutropenia: We did use the dosage to 100 mg of Ibrance her ANC had improved from 0.7-0.86. I discussed with her that we will continue the same dosage and she will come back to see [place] in one month for followup and to repeat blood work. Discussed with her about neutropenic precautions. Instructed her to call us if she develops any fevers.  Bone metastases: Delton See be given today Monitoring closely for toxicities.

## 2014-04-26 ENCOUNTER — Other Ambulatory Visit (HOSPITAL_BASED_OUTPATIENT_CLINIC_OR_DEPARTMENT_OTHER): Payer: 59

## 2014-04-26 DIAGNOSIS — C50111 Malignant neoplasm of central portion of right female breast: Secondary | ICD-10-CM

## 2014-04-26 DIAGNOSIS — C50919 Malignant neoplasm of unspecified site of unspecified female breast: Secondary | ICD-10-CM

## 2014-04-26 LAB — CBC WITH DIFFERENTIAL/PLATELET
BASO%: 2.3 % — ABNORMAL HIGH (ref 0.0–2.0)
BASOS ABS: 0 10*3/uL (ref 0.0–0.1)
EOS%: 0.6 % (ref 0.0–7.0)
Eosinophils Absolute: 0 10*3/uL (ref 0.0–0.5)
HCT: 31.5 % — ABNORMAL LOW (ref 34.8–46.6)
HEMOGLOBIN: 10 g/dL — AB (ref 11.6–15.9)
LYMPH%: 42.8 % (ref 14.0–49.7)
MCH: 26.5 pg (ref 25.1–34.0)
MCHC: 31.6 g/dL (ref 31.5–36.0)
MCV: 83.9 fL (ref 79.5–101.0)
MONO#: 0.2 10*3/uL (ref 0.1–0.9)
MONO%: 8.8 % (ref 0.0–14.0)
NEUT#: 0.8 10*3/uL — ABNORMAL LOW (ref 1.5–6.5)
NEUT%: 45.5 % (ref 38.4–76.8)
Platelets: 285 10*3/uL (ref 145–400)
RBC: 3.75 10*6/uL (ref 3.70–5.45)
RDW: 35.1 % — ABNORMAL HIGH (ref 11.2–14.5)
WBC: 1.7 10*3/uL — ABNORMAL LOW (ref 3.9–10.3)
lymph#: 0.7 10*3/uL — ABNORMAL LOW (ref 0.9–3.3)

## 2014-05-16 ENCOUNTER — Ambulatory Visit: Payer: 59 | Admitting: Lab

## 2014-05-16 ENCOUNTER — Other Ambulatory Visit: Payer: Self-pay

## 2014-05-16 ENCOUNTER — Telehealth: Payer: Self-pay | Admitting: Hematology and Oncology

## 2014-05-16 ENCOUNTER — Ambulatory Visit: Payer: 59

## 2014-05-16 ENCOUNTER — Ambulatory Visit (HOSPITAL_BASED_OUTPATIENT_CLINIC_OR_DEPARTMENT_OTHER): Payer: 59

## 2014-05-16 DIAGNOSIS — C7951 Secondary malignant neoplasm of bone: Secondary | ICD-10-CM

## 2014-05-16 DIAGNOSIS — C773 Secondary and unspecified malignant neoplasm of axilla and upper limb lymph nodes: Secondary | ICD-10-CM

## 2014-05-16 DIAGNOSIS — C50811 Malignant neoplasm of overlapping sites of right female breast: Secondary | ICD-10-CM

## 2014-05-16 DIAGNOSIS — C50111 Malignant neoplasm of central portion of right female breast: Secondary | ICD-10-CM

## 2014-05-16 LAB — CBC WITH DIFFERENTIAL/PLATELET
BASO%: 1.1 % (ref 0.0–2.0)
Basophils Absolute: 0 10*3/uL (ref 0.0–0.1)
EOS%: 0.4 % (ref 0.0–7.0)
Eosinophils Absolute: 0 10*3/uL (ref 0.0–0.5)
HCT: 31.7 % — ABNORMAL LOW (ref 34.8–46.6)
HGB: 10.1 g/dL — ABNORMAL LOW (ref 11.6–15.9)
LYMPH%: 26.1 % (ref 14.0–49.7)
MCH: 27.7 pg (ref 25.1–34.0)
MCHC: 32 g/dL (ref 31.5–36.0)
MCV: 86.5 fL (ref 79.5–101.0)
MONO#: 0.2 10*3/uL (ref 0.1–0.9)
MONO%: 9.7 % (ref 0.0–14.0)
NEUT#: 1.4 10*3/uL — ABNORMAL LOW (ref 1.5–6.5)
NEUT%: 62.7 % (ref 38.4–76.8)
Platelets: 272 10*3/uL (ref 145–400)
RBC: 3.67 10*6/uL — ABNORMAL LOW (ref 3.70–5.45)
RDW: 33.4 % — AB (ref 11.2–14.5)
WBC: 2.2 10*3/uL — ABNORMAL LOW (ref 3.9–10.3)
lymph#: 0.6 10*3/uL — ABNORMAL LOW (ref 0.9–3.3)

## 2014-05-16 LAB — COMPREHENSIVE METABOLIC PANEL (CC13)
ALBUMIN: 3.3 g/dL — AB (ref 3.5–5.0)
ALT: 46 U/L (ref 0–55)
AST: 99 U/L — ABNORMAL HIGH (ref 5–34)
Alkaline Phosphatase: 310 U/L — ABNORMAL HIGH (ref 40–150)
Anion Gap: 10 mEq/L (ref 3–11)
BUN: 17.3 mg/dL (ref 7.0–26.0)
CHLORIDE: 102 meq/L (ref 98–109)
CO2: 24 mEq/L (ref 22–29)
Calcium: 9.2 mg/dL (ref 8.4–10.4)
Creatinine: 0.9 mg/dL (ref 0.6–1.1)
EGFR: 69 mL/min/{1.73_m2} — AB (ref >90)
GLUCOSE: 102 mg/dL (ref 70–140)
POTASSIUM: 4.2 meq/L (ref 3.5–5.1)
SODIUM: 136 meq/L (ref 136–145)
TOTAL PROTEIN: 7.4 g/dL (ref 6.4–8.3)
Total Bilirubin: 0.5 mg/dL (ref 0.20–1.20)

## 2014-05-16 NOTE — Telephone Encounter (Signed)
LM to confirm inj for 05/20/14.

## 2014-05-20 ENCOUNTER — Ambulatory Visit (HOSPITAL_BASED_OUTPATIENT_CLINIC_OR_DEPARTMENT_OTHER): Payer: 59

## 2014-05-20 DIAGNOSIS — C773 Secondary and unspecified malignant neoplasm of axilla and upper limb lymph nodes: Secondary | ICD-10-CM

## 2014-05-20 DIAGNOSIS — C7951 Secondary malignant neoplasm of bone: Secondary | ICD-10-CM

## 2014-05-20 DIAGNOSIS — C50919 Malignant neoplasm of unspecified site of unspecified female breast: Secondary | ICD-10-CM

## 2014-05-20 DIAGNOSIS — C50811 Malignant neoplasm of overlapping sites of right female breast: Secondary | ICD-10-CM

## 2014-05-20 MED ORDER — DENOSUMAB 120 MG/1.7ML ~~LOC~~ SOLN
120.0000 mg | Freq: Once | SUBCUTANEOUS | Status: AC
  Administered 2014-05-20: 120 mg via SUBCUTANEOUS
  Filled 2014-05-20: qty 1.7

## 2014-05-20 NOTE — Patient Instructions (Signed)
Denosumab injection What is this medicine? DENOSUMAB (den oh sue mab) slows bone breakdown. Prolia is used to treat osteoporosis in women after menopause and in men. Xgeva is used to prevent bone fractures and other bone problems caused by cancer bone metastases. Xgeva is also used to treat giant cell tumor of the bone. This medicine may be used for other purposes; ask your health care provider or pharmacist if you have questions. COMMON BRAND NAME(S): Prolia, XGEVA What should I tell my health care provider before I take this medicine? They need to know if you have any of these conditions: -dental disease -eczema -infection or history of infections -kidney disease or on dialysis -low blood calcium or vitamin D -malabsorption syndrome -scheduled to have surgery or tooth extraction -taking medicine that contains denosumab -thyroid or parathyroid disease -an unusual reaction to denosumab, other medicines, foods, dyes, or preservatives -pregnant or trying to get pregnant -breast-feeding How should I use this medicine? This medicine is for injection under the skin. It is given by a health care professional in a hospital or clinic setting. If you are getting Prolia, a special MedGuide will be given to you by the pharmacist with each prescription and refill. Be sure to read this information carefully each time. For Prolia, talk to your pediatrician regarding the use of this medicine in children. Special care may be needed. For Xgeva, talk to your pediatrician regarding the use of this medicine in children. While this drug may be prescribed for children as young as 13 years for selected conditions, precautions do apply. Overdosage: If you think you've taken too much of this medicine contact a poison control center or emergency room at once. Overdosage: If you think you have taken too much of this medicine contact a poison control center or emergency room at once. NOTE: This medicine is only for  you. Do not share this medicine with others. What if I miss a dose? It is important not to miss your dose. Call your doctor or health care professional if you are unable to keep an appointment. What may interact with this medicine? Do not take this medicine with any of the following medications: -other medicines containing denosumab This medicine may also interact with the following medications: -medicines that suppress the immune system -medicines that treat cancer -steroid medicines like prednisone or cortisone This list may not describe all possible interactions. Give your health care provider a list of all the medicines, herbs, non-prescription drugs, or dietary supplements you use. Also tell them if you smoke, drink alcohol, or use illegal drugs. Some items may interact with your medicine. What should I watch for while using this medicine? Visit your doctor or health care professional for regular checks on your progress. Your doctor or health care professional may order blood tests and other tests to see how you are doing. Call your doctor or health care professional if you get a cold or other infection while receiving this medicine. Do not treat yourself. This medicine may decrease your body's ability to fight infection. You should make sure you get enough calcium and vitamin D while you are taking this medicine, unless your doctor tells you not to. Discuss the foods you eat and the vitamins you take with your health care professional. See your dentist regularly. Brush and floss your teeth as directed. Before you have any dental work done, tell your dentist you are receiving this medicine. Do not become pregnant while taking this medicine or for 5 months after stopping   it. Women should inform their doctor if they wish to become pregnant or think they might be pregnant. There is a potential for serious side effects to an unborn child. Talk to your health care professional or pharmacist for more  information. What side effects may I notice from receiving this medicine? Side effects that you should report to your doctor or health care professional as soon as possible: -allergic reactions like skin rash, itching or hives, swelling of the face, lips, or tongue -breathing problems -chest pain -fast, irregular heartbeat -feeling faint or lightheaded, falls -fever, chills, or any other sign of infection -muscle spasms, tightening, or twitches -numbness or tingling -skin blisters or bumps, or is dry, peels, or red -slow healing or unexplained pain in the mouth or jaw -unusual bleeding or bruising Side effects that usually do not require medical attention (Report these to your doctor or health care professional if they continue or are bothersome.): -muscle pain -stomach upset, gas This list may not describe all possible side effects. Call your doctor for medical advice about side effects. You may report side effects to FDA at 1-800-FDA-1088. Where should I keep my medicine? This medicine is only given in a clinic, doctor's office, or other health care setting and will not be stored at home. NOTE: This sheet is a summary. It may not cover all possible information. If you have questions about this medicine, talk to your doctor, pharmacist, or health care provider.  2015, Elsevier/Gold Standard. (2011-11-01 12:37:47)  

## 2014-05-22 ENCOUNTER — Other Ambulatory Visit (HOSPITAL_BASED_OUTPATIENT_CLINIC_OR_DEPARTMENT_OTHER): Payer: 59

## 2014-05-22 ENCOUNTER — Ambulatory Visit (HOSPITAL_BASED_OUTPATIENT_CLINIC_OR_DEPARTMENT_OTHER): Payer: 59 | Admitting: Hematology and Oncology

## 2014-05-22 ENCOUNTER — Telehealth: Payer: Self-pay | Admitting: Hematology and Oncology

## 2014-05-22 ENCOUNTER — Other Ambulatory Visit: Payer: Self-pay | Admitting: *Deleted

## 2014-05-22 ENCOUNTER — Telehealth: Payer: Self-pay | Admitting: *Deleted

## 2014-05-22 VITALS — BP 126/61 | HR 80 | Temp 97.8°F | Resp 18 | Ht 65.0 in | Wt 147.6 lb

## 2014-05-22 DIAGNOSIS — C50811 Malignant neoplasm of overlapping sites of right female breast: Secondary | ICD-10-CM

## 2014-05-22 DIAGNOSIS — C50111 Malignant neoplasm of central portion of right female breast: Secondary | ICD-10-CM

## 2014-05-22 DIAGNOSIS — R5383 Other fatigue: Secondary | ICD-10-CM

## 2014-05-22 DIAGNOSIS — D701 Agranulocytosis secondary to cancer chemotherapy: Secondary | ICD-10-CM

## 2014-05-22 DIAGNOSIS — C7951 Secondary malignant neoplasm of bone: Secondary | ICD-10-CM

## 2014-05-22 DIAGNOSIS — C50919 Malignant neoplasm of unspecified site of unspecified female breast: Secondary | ICD-10-CM

## 2014-05-22 DIAGNOSIS — R110 Nausea: Secondary | ICD-10-CM

## 2014-05-22 DIAGNOSIS — D649 Anemia, unspecified: Secondary | ICD-10-CM

## 2014-05-22 DIAGNOSIS — C773 Secondary and unspecified malignant neoplasm of axilla and upper limb lymph nodes: Secondary | ICD-10-CM

## 2014-05-22 LAB — CBC WITH DIFFERENTIAL/PLATELET
BASO%: 2.6 % — ABNORMAL HIGH (ref 0.0–2.0)
BASOS ABS: 0.1 10*3/uL (ref 0.0–0.1)
EOS ABS: 0 10*3/uL (ref 0.0–0.5)
EOS%: 1.4 % (ref 0.0–7.0)
HEMATOCRIT: 32.8 % — AB (ref 34.8–46.6)
HEMOGLOBIN: 10.2 g/dL — AB (ref 11.6–15.9)
LYMPH%: 35.1 % (ref 14.0–49.7)
MCH: 27.2 pg (ref 25.1–34.0)
MCHC: 31 g/dL — ABNORMAL LOW (ref 31.5–36.0)
MCV: 87.9 fL (ref 79.5–101.0)
MONO#: 0.2 10*3/uL (ref 0.1–0.9)
MONO%: 9.2 % (ref 0.0–14.0)
NEUT%: 51.7 % (ref 38.4–76.8)
NEUTROS ABS: 1 10*3/uL — AB (ref 1.5–6.5)
Platelets: 324 10*3/uL (ref 145–400)
RBC: 3.74 10*6/uL (ref 3.70–5.45)
RDW: 32 % — ABNORMAL HIGH (ref 11.2–14.5)
WBC: 2 10*3/uL — ABNORMAL LOW (ref 3.9–10.3)
lymph#: 0.7 10*3/uL — ABNORMAL LOW (ref 0.9–3.3)
nRBC: 3 % — ABNORMAL HIGH (ref 0–0)

## 2014-05-22 LAB — TECHNOLOGIST REVIEW

## 2014-05-22 NOTE — Telephone Encounter (Signed)
Called and left message for Raquel for pre-auth for Ibrance.

## 2014-05-22 NOTE — Assessment & Plan Note (Addendum)
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  Current therapy: Ibrance with letrozole and Xgeva  Patient is tolerating Ibrance and letrozole fairly well after reducing the dosage of Ibrance to 100 mg daily  Toxicities of Ibrance: 1. Nausea improved with Ginger herbal supplements 2. Fatigue: much improved lower dosage  3. Neutropenia: Dosage of Ibrance was reduced to 100 mg for grade 3 neutropenia. Today her [place] is 1000 once we will maintain her current dosage. 4. anemia: being monitored  Bone metastases: Xgeva along with calcium and vitamin D Monitoring closely for toxicities. We will obtain a PET CT scan in 3 weeks to assess response to treatment and follow-up.

## 2014-05-22 NOTE — Telephone Encounter (Signed)
, °

## 2014-05-22 NOTE — Progress Notes (Signed)
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   11/09/2012 Surgery 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 one out of 13 lymph nodes positive ER 100%, PR 29%, HER-2 negative ratio 0.93 Ki-67 89%   09/26/2013 - 11/08/2013 Radiation Therapy Radiation to right chest wall and right supraclavicular region to a dose of 50.4 gray at 1.8 gray per fraction    Anti-estrogen oral therapy Patient refused antiestrogen therapy and chemotherapy, receives Blue crystal treatment from [place] (patch that sucks out impurities)   02/21/2014 PET scan Diffuse bone metastases involving the axial and appendicular skeleton, biopsy of the left iliac bone positive for metastatic breast cancer   03/12/2014 Treatment Plan Change Ibrance with letrozole and Xgeva     CHIEF COMPLIANT: completed 3 cycles of Ibrance, to start cycle 4 this week  INTERVAL HISTORY: [name] is a 59 year old lady with above-mentioned history of metastatic breast cancer currently on Ibrance with letrozole. Her major side effect to treatment has been neutropenia and we had to reduce the dosage to 100 mg. With the reduced dosage of energy levels have been pretty good. She denies any nausea vomiting or taste changes. She has excellent energy levels. Recently she broke [place] and back and now has developed lower back  pain.  REVIEW OF SYSTEMS:   Constitutional: Denies fevers, chills or abnormal weight loss Eyes: Denies blurriness of vision Ears, nose, mouth, throat, and face: Denies mucositis or sore throat Respiratory: Denies cough, dyspnea or wheezes Cardiovascular: Denies palpitation, chest discomfort or lower extremity swelling Gastrointestinal:  Denies nausea, heartburn or change in bowel habits Skin: Denies abnormal skin rashes Lymphatics: Denies new lymphadenopathy or easy bruising Neurological:Denies numbness, tingling or new weaknesses Behavioral/Psych: Mood is stable, no new changes  Breast:  denies any pain or lumps or nodules in either breasts All other systems were reviewed with the patient and are negative.  I have reviewed the past medical history, past surgical history, social history and family history with the patient and they are unchanged from previous note.  ALLERGIES:  is allergic to penicillins.  MEDICATIONS:  Current Outpatient Prescriptions  Medication Sig Dispense Refill  . Calcium Carbonate-Vit D-Min (CALCIUM 1200 PO) Take 1 tablet by mouth every morning.    Marland Kitchen LORazepam (ATIVAN) 0.5 MG tablet May take 1-2 tablets every 8 hours as needed for restlessness. 30 tablet 0  . Multiple Vitamins-Minerals (MULTIVITAMIN PO) Take 1 tablet by mouth every morning.     Marland Kitchen OVER THE COUNTER MEDICATION Take 4 tablets by mouth every morning. Peptids supplements    . OVER THE COUNTER MEDICATION Take 4 tablets by mouth 2 (two) times daily. ([place])    . oxyCODONE-acetaminophen (PERCOCET/ROXICET) 5-325 MG per tablet 1-2 tabs every 6 hours as needed 30 tablet 0  . palbociclib (IBRANCE) 100 MG capsule Take 1 capsule (100 mg total)  by mouth daily with breakfast. Take whole with food. 21 capsule 3  . traMADol (ULTRAM) 50 MG tablet Take 50 mg by mouth every 6 (six) hours as needed for moderate pain.    Marland Kitchen UNABLE TO FIND C1448 Silicone Breast Prosthesis, Rt quantity 1 L8020 Mastectomy form, Rt,  quantity 1 L8000 Mastectomy bra, quantity 6    . UNABLE TO FIND 174.9 Malignant Neoplasm   Mastectomy Right  L8030-Silicone Breast Prosthesis-1  L8000- Mastectomy Bra-2 1 each 0   No current facility-administered medications for this visit.    PHYSICAL EXAMINATION: ECOG PERFORMANCE STATUS: 0 - Asymptomatic  Filed Vitals:   05/22/14 1411  BP: 126/61  Pulse: 80  Temp: 97.8 F (36.6 C)  Resp: 18   Filed Weights   05/22/14 1411  Weight: 147 lb 9.6 oz (66.951 kg)    GENERAL:alert, no distress and comfortable SKIN: skin color, texture, turgor are normal, no rashes or significant lesions EYES: normal, Conjunctiva are pink and non-injected, sclera clear OROPHARYNX:no exudate, no erythema and lips, buccal mucosa, and tongue normal  NECK: supple, thyroid normal size, non-tender, without nodularity LYMPH:  no palpable lymphadenopathy in the cervical, axillary or inguinal LUNGS: clear to auscultation and percussion with normal breathing effort HEART: regular rate & rhythm and no murmurs and no lower extremity edema ABDOMEN:abdomen soft, non-tender and normal bowel sounds Musculoskeletal:no cyanosis of digits and no clubbing  NEURO: alert & oriented x 3 with fluent speech, no focal motor/sensory deficits  LABORATORY DATA:  I have reviewed the data as listed   Chemistry      Component Value Date/Time   NA 136 05/16/2014 1511   NA 139 11/08/2012 0620   K 4.2 05/16/2014 1511   K 4.2 11/08/2012 0620   CL 105 11/08/2012 0620   CL 106 10/23/2012 1459   CO2 24 05/16/2014 1511   CO2 30 11/08/2012 0620   BUN 17.3 05/16/2014 1511   BUN 8 11/08/2012 0620   CREATININE 0.9 05/16/2014 1511   CREATININE 0.74 11/08/2012 0620   CREATININE 0.82 08/18/2011 1056      Component Value Date/Time   CALCIUM 9.2 05/16/2014 1511   CALCIUM 9.0 11/08/2012 0620   ALKPHOS 310* 05/16/2014 1511   ALKPHOS 96 11/06/2012 0850   AST 99* 05/16/2014 1511   AST 15 11/06/2012 0850   ALT 46 05/16/2014  1511   ALT 14 11/06/2012 0850   BILITOT 0.50 05/16/2014 1511   BILITOT 0.4 11/06/2012 0850       Lab Results  Component Value Date   WBC 2.0* 05/22/2014   HGB 10.2* 05/22/2014   HCT 32.8* 05/22/2014   MCV 87.9 05/22/2014   PLT 324 05/22/2014   NEUTROABS 1.0* 05/22/2014    ASSESSMENT & PLAN:  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  Current therapy: Ibrance with letrozole and Xgeva  Patient is tolerating Ibrance and letrozole fairly well after reducing the dosage of Ibrance to 100 mg daily  Toxicities of Ibrance: 1. Nausea improved with Ginger herbal supplements 2. Fatigue: much improved lower dosage  3. Neutropenia: Dosage of Ibrance was reduced to 100 mg for grade 3 neutropenia. Today her [place] is 1000 once we will maintain her current dosage. 4. anemia: being monitored  Bone metastases: Xgeva along with calcium  and vitamin D Monitoring closely for toxicities. We will obtain a PET CT scan in 3 weeks to assess response to treatment and follow-up.   Orders Placed This Encounter  Procedures  . NM PET Image Restage (PS) Whole Body    Standing Status: Future     Number of Occurrences:      Standing Expiration Date: 05/22/2015    Order Specific Question:  Reason for Exam (SYMPTOM  OR DIAGNOSIS REQUIRED)    Answer:  metastatic breast cancer restaging on treatment    Order Specific Question:  Is the patient pregnant?    Answer:  No    Order Specific Question:  Preferred imaging location?    Answer:  [facility]  . CBC with Differential    Standing Status: Future     Number of Occurrences:      Standing Expiration Date: 05/22/2015  . Comprehensive metabolic panel  (Cmet) - [facility]    Standing Status: Future     Number of Occurrences:      Standing Expiration Date: 05/22/2015   The patient has a good understanding of the overall plan. she agrees with it. She will call with any problems that may develop before her next visit here.   Rulon Eisenmenger, MD 05/22/2014 2:30 PM

## 2014-05-23 ENCOUNTER — Encounter: Payer: Self-pay | Admitting: Hematology and Oncology

## 2014-05-23 ENCOUNTER — Encounter: Payer: Self-pay | Admitting: *Deleted

## 2014-05-23 NOTE — Progress Notes (Unsigned)
Faxed ibrance pa form to [employer]

## 2014-05-23 NOTE — Progress Notes (Signed)
RECEIVED A FAX FROM [facility] CONCERNING A PRIOR AUTHORIZATION FOR IBRANCE.

## 2014-05-24 ENCOUNTER — Telehealth: Payer: Self-pay | Admitting: *Deleted

## 2014-05-24 ENCOUNTER — Encounter: Payer: Self-pay | Admitting: Hematology and Oncology

## 2014-05-24 NOTE — Progress Notes (Signed)
[employer] approved ibrance from 05/23/14-05/24/15

## 2014-05-24 NOTE — Telephone Encounter (Signed)
Called [facility] outpatient pharmacy as [facility] as been pre-auth'd. Pharmacy to fill rx and notify patient when ready to pick up.

## 2014-06-03 ENCOUNTER — Encounter: Payer: Self-pay | Admitting: *Deleted

## 2014-06-03 NOTE — Progress Notes (Signed)
FMLA paperwork placed in [name]'s box.

## 2014-06-04 ENCOUNTER — Encounter: Payer: Self-pay | Admitting: Hematology and Oncology

## 2014-06-04 NOTE — Progress Notes (Signed)
Put fmla form on nurse's desk °

## 2014-06-07 ENCOUNTER — Telehealth: Payer: Self-pay

## 2014-06-07 NOTE — Telephone Encounter (Signed)
Medical Certification form signed by Dr. Lindi Adie and returned to [facility].

## 2014-06-10 ENCOUNTER — Encounter: Payer: Self-pay | Admitting: Hematology and Oncology

## 2014-06-10 NOTE — Progress Notes (Signed)
Faxed fmla form to [employer] @ [phone]

## 2014-06-14 ENCOUNTER — Ambulatory Visit: Payer: 59

## 2014-06-19 ENCOUNTER — Other Ambulatory Visit (HOSPITAL_BASED_OUTPATIENT_CLINIC_OR_DEPARTMENT_OTHER): Payer: 59

## 2014-06-19 ENCOUNTER — Telehealth: Payer: Self-pay | Admitting: Hematology and Oncology

## 2014-06-19 ENCOUNTER — Other Ambulatory Visit: Payer: Self-pay | Admitting: *Deleted

## 2014-06-19 ENCOUNTER — Ambulatory Visit (HOSPITAL_BASED_OUTPATIENT_CLINIC_OR_DEPARTMENT_OTHER): Payer: 59

## 2014-06-19 ENCOUNTER — Ambulatory Visit (HOSPITAL_BASED_OUTPATIENT_CLINIC_OR_DEPARTMENT_OTHER): Payer: 59 | Admitting: Hematology and Oncology

## 2014-06-19 VITALS — BP 123/62 | HR 81 | Temp 98.4°F | Resp 18 | Ht 65.0 in | Wt 144.9 lb

## 2014-06-19 DIAGNOSIS — C50111 Malignant neoplasm of central portion of right female breast: Secondary | ICD-10-CM

## 2014-06-19 DIAGNOSIS — C7951 Secondary malignant neoplasm of bone: Secondary | ICD-10-CM

## 2014-06-19 DIAGNOSIS — D709 Neutropenia, unspecified: Secondary | ICD-10-CM

## 2014-06-19 DIAGNOSIS — D649 Anemia, unspecified: Secondary | ICD-10-CM

## 2014-06-19 DIAGNOSIS — Z170 Estrogen receptor positive status [ER+]: Secondary | ICD-10-CM

## 2014-06-19 DIAGNOSIS — C50919 Malignant neoplasm of unspecified site of unspecified female breast: Secondary | ICD-10-CM

## 2014-06-19 LAB — CBC WITH DIFFERENTIAL/PLATELET
BASO%: 3.9 % — ABNORMAL HIGH (ref 0.0–2.0)
Basophils Absolute: 0.1 10*3/uL (ref 0.0–0.1)
EOS%: 0.8 % (ref 0.0–7.0)
Eosinophils Absolute: 0 10*3/uL (ref 0.0–0.5)
HEMATOCRIT: 29.9 % — AB (ref 34.8–46.6)
HGB: 9.7 g/dL — ABNORMAL LOW (ref 11.6–15.9)
LYMPH#: 0.6 10*3/uL — AB (ref 0.9–3.3)
LYMPH%: 45.7 % (ref 14.0–49.7)
MCH: 29.5 pg (ref 25.1–34.0)
MCHC: 32.4 g/dL (ref 31.5–36.0)
MCV: 90.9 fL (ref 79.5–101.0)
MONO#: 0.1 10*3/uL (ref 0.1–0.9)
MONO%: 9.3 % (ref 0.0–14.0)
NEUT#: 0.5 10*3/uL — CL (ref 1.5–6.5)
NEUT%: 40.3 % (ref 38.4–76.8)
NRBC: 13 % — AB (ref 0–0)
Platelets: 145 10*3/uL (ref 145–400)
RBC: 3.29 10*6/uL — ABNORMAL LOW (ref 3.70–5.45)
RDW: 23.6 % — AB (ref 11.2–14.5)
WBC: 1.3 10*3/uL — ABNORMAL LOW (ref 3.9–10.3)

## 2014-06-19 LAB — COMPREHENSIVE METABOLIC PANEL (CC13)
ALBUMIN: 3.3 g/dL — AB (ref 3.5–5.0)
ALK PHOS: 311 U/L — AB (ref 40–150)
ALT: 71 U/L — ABNORMAL HIGH (ref 0–55)
AST: 86 U/L — AB (ref 5–34)
Anion Gap: 10 mEq/L (ref 3–11)
BUN: 21 mg/dL (ref 7.0–26.0)
CO2: 23 mEq/L (ref 22–29)
CREATININE: 0.9 mg/dL (ref 0.6–1.1)
Calcium: 8.8 mg/dL (ref 8.4–10.4)
Chloride: 107 mEq/L (ref 98–109)
EGFR: 74 mL/min/{1.73_m2} — ABNORMAL LOW (ref >90)
Glucose: 96 mg/dl (ref 70–140)
Potassium: 3.9 mEq/L (ref 3.5–5.1)
Sodium: 141 mEq/L (ref 136–145)
Total Bilirubin: 0.32 mg/dL (ref 0.20–1.20)
Total Protein: 7.2 g/dL (ref 6.4–8.3)

## 2014-06-19 LAB — TECHNOLOGIST REVIEW

## 2014-06-19 MED ORDER — DENOSUMAB 120 MG/1.7ML ~~LOC~~ SOLN
120.0000 mg | Freq: Once | SUBCUTANEOUS | Status: AC
  Administered 2014-06-19: 120 mg via SUBCUTANEOUS
  Filled 2014-06-19: qty 1.7

## 2014-06-19 MED ORDER — PALBOCICLIB 75 MG PO CAPS: 75.0000 mg | ORAL_CAPSULE | Freq: Every day | ORAL | Status: DC

## 2014-06-19 NOTE — Assessment & Plan Note (Addendum)
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  Current therapy: Ibrance with letrozole and Xgeva   Toxicities of Ibrance: 1. Nausea improved with Ginger herbal supplements 2. Fatigue: much improved lower dosage  3. Neutropenia: Dosage of Ibrance was reduced to 100 mg for grade 3 neutropenia and further decreased to 75 mg 06/19/2014 for neutropenia ANC 500 4. anemia: being monitored 5. Elevated liver enzymes: Being monitored  Patient has been taking an herbal supplement called Veramin and things that the herbal supplement is a reason why her cancer is getting better and not because of antiestrogen treatments and Ibrance.  Bone metastases: Xgeva along with calcium and vitamin D Monitoring closely for toxicities. Return to clinic after PET CT scan in a month

## 2014-06-19 NOTE — Progress Notes (Signed)
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   11/09/2012 Surgery 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 one out of 13 lymph nodes positive ER 100%, PR 29%, HER-2 negative ratio 0.93 Ki-67 89%   09/26/2013 - 11/08/2013 Radiation Therapy Radiation to right chest wall and right supraclavicular region to a dose of 50.4 gray at 1.8 gray per fraction    Anti-estrogen oral therapy Patient refused antiestrogen therapy and chemotherapy, receives Blue crystal treatment from [place] (patch that sucks out impurities)   02/21/2014 PET scan Diffuse bone metastases involving the axial and appendicular skeleton, biopsy of the left iliac bone positive for metastatic breast cancer   03/12/2014 Treatment Plan Change Ibrance with letrozole and Xgeva     CHIEF COMPLIANT: follow-up on Ibrance  INTERVAL HISTORY: [name] is a 59 year old lady with above-mentioned history metastatic breast cancer currently on Ibrance with letrozole. She has been tolerating Ibrance fairly well without any major symptoms however her blood counts have been going lower. Previously her absolute neutrophil count was 1000 when we had discontinued her Ibrance and she is here today on the week off with a blood count check showing an ANC of 500. She denies any major problems other than occasional constipation and  occasional nausea.  REVIEW OF SYSTEMS:   Constitutional: Denies fevers, chills or abnormal weight loss Eyes: Denies blurriness of vision Ears, nose, mouth, throat, and face: Denies mucositis or sore throat Respiratory: Denies cough, dyspnea or wheezes Cardiovascular: Denies palpitation, chest discomfort or lower extremity swelling Gastrointestinal:  Occasional nausea and constipation Skin: Denies abnormal skin rashes Lymphatics: Denies new lymphadenopathy or easy bruising Neurological:Denies numbness, tingling or new weaknesses Behavioral/Psych: Mood is stable, no new changes  Breast:  denies any pain or lumps or nodules in either breasts All other systems were reviewed with the patient and are negative.  I have reviewed the past medical history, past surgical history, social history and family history with the patient and they are unchanged from previous note.  ALLERGIES:  is allergic to penicillins.  MEDICATIONS:  Current Outpatient Prescriptions  Medication Sig Dispense Refill  . Calcium Carbonate-Vit D-Min (CALCIUM 1200 PO) Take 1 tablet by mouth every morning.    Marland Kitchen LORazepam (ATIVAN) 0.5 MG tablet May take 1-2 tablets every 8 hours as needed for restlessness. 30 tablet 0  . Multiple Vitamins-Minerals (MULTIVITAMIN PO) Take 1 tablet by mouth every morning.     Marland Kitchen OVER THE COUNTER MEDICATION Take 4 tablets by mouth every morning. Peptids supplements    . OVER THE COUNTER MEDICATION Take 4 tablets by mouth 2 (two) times daily. ([place])    . palbociclib (IBRANCE) 75 MG capsule Take 1 capsule (75 mg total) by mouth daily with breakfast. Take whole with food. 21 capsule 3  . traMADol (ULTRAM) 50 MG tablet Take 50 mg by mouth every  6 (six) hours as needed for moderate pain.    Marland Kitchen UNABLE TO FIND F5732 Silicone Breast Prosthesis, Rt quantity 1 L8020 Mastectomy form, Rt, quantity 1 L8000 Mastectomy bra, quantity 6    . UNABLE TO FIND 174.9 Malignant Neoplasm   Mastectomy  Right  L8030-Silicone Breast Prosthesis-1  L8000- Mastectomy Bra-2 1 each 0  . oxyCODONE-acetaminophen (PERCOCET/ROXICET) 5-325 MG per tablet 1-2 tabs every 6 hours as needed (Patient not taking: Reported on 06/19/2014) 30 tablet 0   No current facility-administered medications for this visit.    PHYSICAL EXAMINATION: ECOG PERFORMANCE STATUS: 1 - Symptomatic but completely ambulatory  Filed Vitals:   06/19/14 1408  BP: 123/62  Pulse: 81  Temp: 98.4 F (36.9 C)  Resp: 18   Filed Weights   06/19/14 1408  Weight: 144 lb 14.4 oz (65.726 kg)    GENERAL:alert, no distress and comfortable SKIN: skin color, texture, turgor are normal, no rashes or significant lesions EYES: normal, Conjunctiva are pink and non-injected, sclera clear OROPHARYNX:no exudate, no erythema and lips, buccal mucosa, and tongue normal  NECK: supple, thyroid normal size, non-tender, without nodularity LYMPH:  no palpable lymphadenopathy in the cervical, axillary or inguinal LUNGS: clear to auscultation and percussion with normal breathing effort HEART: regular rate & rhythm and no murmurs and no lower extremity edema ABDOMEN:abdomen soft, non-tender and normal bowel sounds Musculoskeletal:no cyanosis of digits and no clubbing  NEURO: alert & oriented x 3 with fluent speech, no focal motor/sensory deficits  LABORATORY DATA:  I have reviewed the data as listed   Chemistry      Component Value Date/Time   NA 141 06/19/2014 1322   NA 139 11/08/2012 0620   K 3.9 06/19/2014 1322   K 4.2 11/08/2012 0620   CL 105 11/08/2012 0620   CL 106 10/23/2012 1459   CO2 23 06/19/2014 1322   CO2 30 11/08/2012 0620   BUN 21.0 06/19/2014 1322   BUN 8 11/08/2012 0620   CREATININE 0.9 06/19/2014 1322   CREATININE 0.74 11/08/2012 0620   CREATININE 0.82 08/18/2011 1056      Component Value Date/Time   CALCIUM 8.8 06/19/2014 1322   CALCIUM 9.0 11/08/2012 0620   ALKPHOS 311* 06/19/2014 1322   ALKPHOS 96 11/06/2012 0850    AST 86* 06/19/2014 1322   AST 15 11/06/2012 0850   ALT 71* 06/19/2014 1322   ALT 14 11/06/2012 0850   BILITOT 0.32 06/19/2014 1322   BILITOT 0.4 11/06/2012 0850       Lab Results  Component Value Date   WBC 1.3* 06/19/2014   HGB 9.7* 06/19/2014   HCT 29.9* 06/19/2014   MCV 90.9 06/19/2014   PLT 145 06/19/2014   NEUTROABS 0.5* 06/19/2014    ASSESSMENT & PLAN:  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  Current therapy: Ibrance with letrozole and Xgeva   Toxicities of Ibrance: 1. Nausea improved with Ginger herbal supplements 2. Fatigue: much improved lower dosage  3. Neutropenia: Dosage of Ibrance was reduced to 100 mg for grade 3 neutropenia and further decreased to 75 mg 06/19/2014 for neutropenia ANC 500 4. anemia: being monitored 5. Elevated liver enzymes: Being monitored  Patient has been taking an herbal supplement called Veramin and things that the herbal supplement is a  reason why her cancer is getting better and not because of antiestrogen treatments and Ibrance.  Bone metastases: Xgeva along with calcium and vitamin D Monitoring closely for toxicities. Return to clinic after PET CT scan in a month    Orders Placed This Encounter  Procedures  . CBC with Differential    Standing Status: Future     Number of Occurrences:      Standing Expiration Date: 06/19/2015  . Comprehensive metabolic panel (Cmet) - [facility]    Standing Status: Future     Number of Occurrences:      Standing Expiration Date: 06/19/2015   The patient has a good understanding of the overall plan. she agrees with it. She will call with any problems that may develop before her next visit  here.   Rulon Eisenmenger, MD

## 2014-06-19 NOTE — Telephone Encounter (Signed)
per pof to sch pt appt-gave pt copy of sch °

## 2014-06-26 ENCOUNTER — Encounter: Payer: Self-pay | Admitting: Hematology and Oncology

## 2014-06-26 NOTE — Progress Notes (Signed)
Faxed clinical information to [employer] [phone] to extend patient's disability.

## 2014-07-11 ENCOUNTER — Other Ambulatory Visit: Payer: Self-pay | Admitting: *Deleted

## 2014-07-11 DIAGNOSIS — C50119 Malignant neoplasm of central portion of unspecified female breast: Secondary | ICD-10-CM

## 2014-07-11 MED ORDER — OXYCODONE-ACETAMINOPHEN 5-325 MG PO TABS: ORAL_TABLET | ORAL | Status: DC

## 2014-07-12 ENCOUNTER — Ambulatory Visit: Payer: 59

## 2014-07-17 ENCOUNTER — Other Ambulatory Visit: Payer: Self-pay

## 2014-07-17 ENCOUNTER — Other Ambulatory Visit: Payer: 59

## 2014-07-17 ENCOUNTER — Ambulatory Visit: Payer: 59

## 2014-07-17 ENCOUNTER — Ambulatory Visit: Payer: 59 | Admitting: Hematology and Oncology

## 2014-07-17 ENCOUNTER — Telehealth: Payer: Self-pay | Admitting: Hematology and Oncology

## 2014-07-17 NOTE — Telephone Encounter (Signed)
S/w pt confirming r/s labs/ov/inj from 03/02 to 03/08 per 03/02 POF..... KJ

## 2014-07-22 ENCOUNTER — Ambulatory Visit (HOSPITAL_COMMUNITY)
Admission: RE | Admit: 2014-07-22 | Discharge: 2014-07-22 | Disposition: A | Discharge status: Home / Self Care | Payer: 59 | Source: Ambulatory Visit | Attending: Hematology and Oncology | Admitting: Hematology and Oncology

## 2014-07-22 DIAGNOSIS — C50111 Malignant neoplasm of central portion of right female breast: Secondary | ICD-10-CM

## 2014-07-22 LAB — GLUCOSE, CAPILLARY: GLUCOSE-CAPILLARY: 88 mg/dL (ref 70–99)

## 2014-07-22 MED ORDER — FLUDEOXYGLUCOSE F - 18 (FDG) INJECTION
7.2800 | Freq: Once | INTRAVENOUS | Status: AC | PRN
  Administered 2014-07-22: 7.28 via INTRAVENOUS

## 2014-07-23 ENCOUNTER — Other Ambulatory Visit (HOSPITAL_BASED_OUTPATIENT_CLINIC_OR_DEPARTMENT_OTHER): Payer: 59

## 2014-07-23 ENCOUNTER — Telehealth: Payer: Self-pay | Admitting: Hematology and Oncology

## 2014-07-23 ENCOUNTER — Ambulatory Visit (HOSPITAL_BASED_OUTPATIENT_CLINIC_OR_DEPARTMENT_OTHER): Payer: 59 | Admitting: Hematology and Oncology

## 2014-07-23 ENCOUNTER — Ambulatory Visit: Payer: 59

## 2014-07-23 VITALS — BP 127/69 | HR 89 | Temp 98.2°F | Resp 18 | Ht 65.0 in | Wt 142.5 lb

## 2014-07-23 DIAGNOSIS — C773 Secondary and unspecified malignant neoplasm of axilla and upper limb lymph nodes: Secondary | ICD-10-CM

## 2014-07-23 DIAGNOSIS — C50811 Malignant neoplasm of overlapping sites of right female breast: Secondary | ICD-10-CM

## 2014-07-23 DIAGNOSIS — C7951 Secondary malignant neoplasm of bone: Secondary | ICD-10-CM

## 2014-07-23 DIAGNOSIS — C50111 Malignant neoplasm of central portion of right female breast: Secondary | ICD-10-CM

## 2014-07-23 DIAGNOSIS — K769 Liver disease, unspecified: Secondary | ICD-10-CM

## 2014-07-23 LAB — CBC WITH DIFFERENTIAL/PLATELET
HCT: 29.1 % — ABNORMAL LOW (ref 34.8–46.6)
HGB: 9.3 g/dL — ABNORMAL LOW (ref 11.6–15.9)
MCH: 30.5 pg (ref 25.1–34.0)
MCHC: 32 g/dL (ref 31.5–36.0)
MCV: 95.4 fL (ref 79.5–101.0)
Platelets: 101 10*3/uL — ABNORMAL LOW (ref 145–400)
RBC: 3.05 10*6/uL — AB (ref 3.70–5.45)
RDW: 19.6 % — ABNORMAL HIGH (ref 11.2–14.5)
WBC: 1.9 10*3/uL — ABNORMAL LOW (ref 3.9–10.3)

## 2014-07-23 LAB — COMPREHENSIVE METABOLIC PANEL (CC13)
ALBUMIN: 3.4 g/dL — AB (ref 3.5–5.0)
ALT: 50 U/L (ref 0–55)
ANION GAP: 12 meq/L — AB (ref 3–11)
AST: 92 U/L — ABNORMAL HIGH (ref 5–34)
Alkaline Phosphatase: 288 U/L — ABNORMAL HIGH (ref 40–150)
BUN: 19.5 mg/dL (ref 7.0–26.0)
CO2: 23 meq/L (ref 22–29)
CREATININE: 0.9 mg/dL (ref 0.6–1.1)
Calcium: 9.5 mg/dL (ref 8.4–10.4)
Chloride: 104 mEq/L (ref 98–109)
EGFR: 74 mL/min/{1.73_m2} — ABNORMAL LOW (ref >90)
Glucose: 74 mg/dl (ref 70–140)
Potassium: 4.3 mEq/L (ref 3.5–5.1)
Sodium: 139 mEq/L (ref 136–145)
TOTAL PROTEIN: 7.3 g/dL (ref 6.4–8.3)
Total Bilirubin: 0.61 mg/dL (ref 0.20–1.20)

## 2014-07-23 LAB — MANUAL DIFFERENTIAL
ALC: 1 10*3/uL (ref 0.9–3.3)
ANC (CHCC manual diff): 0.7 10*3/uL — ABNORMAL LOW (ref 1.5–6.5)
BASOPHIL: 2 % (ref 0–2)
Band Neutrophils: 6 % (ref 0–10)
Blasts: 1 % — ABNORMAL HIGH (ref 0–0)
EOS: 1 % (ref 0–7)
LYMPH: 51 % — AB (ref 14–49)
METAMYELOCYTES PCT: 2 % — AB (ref 0–0)
MONO: 9 % (ref 0–14)
Myelocytes: 1 % — ABNORMAL HIGH (ref 0–0)
NRBC: 47 % — AB (ref 0–0)
Other Cell: 0 % (ref 0–0)
PLT EST: DECREASED
PROMYELO: 0 % (ref 0–0)
SEG: 27 % — ABNORMAL LOW (ref 38–77)
VARIANT LYMPH: 0 % (ref 0–0)

## 2014-07-23 MED ORDER — PROCHLORPERAZINE MALEATE 10 MG PO TABS: 10.0000 mg | ORAL_TABLET | Freq: Four times a day (QID) | ORAL | Status: DC | PRN

## 2014-07-23 MED ORDER — LORAZEPAM 0.5 MG PO TABS: 0.5000 mg | ORAL_TABLET | Freq: Every day | ORAL | Status: DC

## 2014-07-23 MED ORDER — ONDANSETRON HCL 8 MG PO TABS: 8.0000 mg | ORAL_TABLET | Freq: Two times a day (BID) | ORAL | Status: DC

## 2014-07-23 MED ORDER — LIDOCAINE-PRILOCAINE 2.5-2.5 % EX CREA: TOPICAL_CREAM | CUTANEOUS | Status: DC

## 2014-07-23 NOTE — Assessment & Plan Note (Signed)
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 Treated with Ibrance plus letrozole plus X [place] since 03/12/2014.  Radiology counseling PET CT scan: Multiple new hypermetabolic small lymph nodes in the posterior supraclavicular region bilaterally, new right hilar focal area of hypermetabolic uptake, multiple new hypermetabolic lesions in the liver parenchyma suspicious for metastatic disease, both adrenal glands are hypermetabolic and widespread bony metastases. This suggests progression of disease to [facility] with letrozole.  Recommendation: Systemic chemotherapy with Abraxane 3 weeks on 1 week of 2 tacking the rapidly progressing disease. The other option would be to consider Faslodex with Ibrance. I do not believe this would be of much benefit. We can certainly consider the second option as a maintenance treatment option.

## 2014-07-23 NOTE — Progress Notes (Signed)
Ativan prescription called in to [facility].

## 2014-07-23 NOTE — Progress Notes (Signed)
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   11/09/2012 Surgery 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 one out of 13 lymph nodes positive ER 100%, PR 29%, HER-2 negative ratio 0.93 Ki-67 89%   09/26/2013 - 11/08/2013 Radiation Therapy Radiation to right chest wall and right supraclavicular region to a dose of 50.4 gray at 1.8 gray per fraction    Anti-estrogen oral therapy Patient refused antiestrogen therapy and chemotherapy, receives Blue crystal treatment from [place] (patch that sucks out impurities)   02/21/2014 PET scan Diffuse bone metastases involving the axial and appendicular skeleton, biopsy of the left iliac bone positive for metastatic breast cancer   03/12/2014 Treatment Plan Change Ibrance with letrozole and Xgeva    07/22/2014 PET scan New hypermetabolic lymph nodes in the neck and chest, new abnormalities in the liver without underlying CT imaging abnormality, both adrenal glands are hypermetabolic widespread bony metastases as before    CHIEF COMPLIANT: Patient is here to discuss results of PET/CT scan  INTERVAL HISTORY: [name] is a 59 year old lady with above-mentioned history of metastatic breast cancer with bone metastases was on antiestrogen therapy and letrozole. She had a repeat PET CT scan is here today to discuss the result. She has not  had any bone pain or discomfort. She reports after Delton See, she complains of some trouble swallowing for a day.  REVIEW OF SYSTEMS:   Constitutional: Denies fevers, chills or abnormal weight loss Eyes: Denies blurriness of vision Ears, nose, mouth, throat, and face: Denies mucositis or sore throat Respiratory: Denies cough, dyspnea or wheezes Cardiovascular: Denies palpitation, chest discomfort or lower extremity swelling Gastrointestinal:  Denies nausea, heartburn or change in bowel habits Skin: Denies abnormal skin rashes Lymphatics: Denies new lymphadenopathy or easy bruising Neurological:Denies numbness, tingling or new weaknesses Behavioral/Psych: Mood is stable, no new changes  Breast:  denies any pain or lumps or nodules in either breasts All other systems were reviewed with the patient and are negative.  I have reviewed the past medical history, past surgical history, social history and family history with the patient and they are unchanged from previous note.  ALLERGIES:  is allergic to penicillins.  MEDICATIONS:  Current Outpatient Prescriptions  Medication Sig Dispense Refill  . Calcium Carbonate-Vit D-Min (CALCIUM 1200 PO) Take 1 tablet by mouth every morning.    Marland Kitchen LORazepam (ATIVAN) 0.5 MG tablet May take 1-2 tablets every 8 hours as needed for restlessness. 30 tablet 0  . Multiple Vitamins-Minerals (MULTIVITAMIN PO) Take 1 tablet by mouth every morning.     Marland Kitchen OVER THE COUNTER MEDICATION Take 4 tablets by mouth every morning. Peptids supplements    . OVER THE COUNTER MEDICATION Take 4 tablets by mouth 2 (two) times daily. ([place])    . oxyCODONE-acetaminophen (PERCOCET/ROXICET) 5-325 MG per tablet 1-2 tabs every 6 hours as needed 30  tablet 0  . palbociclib (IBRANCE) 75 MG capsule Take 1 capsule (75 mg total) by mouth daily with breakfast. Take whole with food. 21 capsule 3  . traMADol (ULTRAM) 50 MG tablet Take 50 mg by mouth every 6 (six) hours as needed for moderate  pain.    Marland Kitchen UNABLE TO FIND V8938 Silicone Breast Prosthesis, Rt quantity 1 L8020 Mastectomy form, Rt, quantity 1 L8000 Mastectomy bra, quantity 6    . UNABLE TO FIND 174.9 Malignant Neoplasm   Mastectomy Right  L8030-Silicone Breast Prosthesis-1  L8000- Mastectomy Bra-2 1 each 0  . lidocaine-prilocaine (EMLA) cream Apply to affected area once 30 g 3  . LORazepam (ATIVAN) 0.5 MG tablet Take 1 tablet (0.5 mg total) by mouth at bedtime. 30 tablet 0  . ondansetron (ZOFRAN) 8 MG tablet Take 1 tablet (8 mg total) by mouth 2 (two) times daily. Start the day after chemo for 2 days. Then take as needed for nausea or vomiting. 30 tablet 1  . prochlorperazine (COMPAZINE) 10 MG tablet Take 1 tablet (10 mg total) by mouth every 6 (six) hours as needed (Nausea or vomiting). 30 tablet 1   No current facility-administered medications for this visit.    PHYSICAL EXAMINATION: ECOG PERFORMANCE STATUS: 0 - Asymptomatic  Filed Vitals:   07/23/14 1530  BP: 127/69  Pulse: 89  Temp: 98.2 F (36.8 C)  Resp: 18   Filed Weights   07/23/14 1530  Weight: 142 lb 8 oz (64.638 kg)    GENERAL:alert, no distress and comfortable SKIN: skin color, texture, turgor are normal, no rashes or significant lesions EYES: normal, Conjunctiva are pink and non-injected, sclera clear OROPHARYNX:no exudate, no erythema and lips, buccal mucosa, and tongue normal  NECK: supple, thyroid normal size, non-tender, without nodularity LYMPH:  no palpable lymphadenopathy in the cervical, axillary or inguinal LUNGS: clear to auscultation and percussion with normal breathing effort HEART: regular rate & rhythm and no murmurs and no lower extremity edema ABDOMEN:abdomen soft, non-tender and normal bowel sounds Musculoskeletal:no cyanosis of digits and no clubbing  NEURO: alert & oriented x 3 with fluent speech, no focal motor/sensory deficits  LABORATORY DATA:  I have reviewed the data as listed   Chemistry      Component Value  Date/Time   NA 139 07/23/2014 1519   NA 139 11/08/2012 0620   K 4.3 07/23/2014 1519   K 4.2 11/08/2012 0620   CL 105 11/08/2012 0620   CL 106 10/23/2012 1459   CO2 23 07/23/2014 1519   CO2 30 11/08/2012 0620   BUN 19.5 07/23/2014 1519   BUN 8 11/08/2012 0620   CREATININE 0.9 07/23/2014 1519   CREATININE 0.74 11/08/2012 0620   CREATININE 0.82 08/18/2011 1056      Component Value Date/Time   CALCIUM 9.5 07/23/2014 1519   CALCIUM 9.0 11/08/2012 0620   ALKPHOS 288* 07/23/2014 1519   ALKPHOS 96 11/06/2012 0850   AST 92* 07/23/2014 1519   AST 15 11/06/2012 0850   ALT 50 07/23/2014 1519   ALT 14 11/06/2012 0850   BILITOT 0.61 07/23/2014 1519   BILITOT 0.4 11/06/2012 0850       Lab Results  Component Value Date   WBC 1.9* 07/23/2014   HGB 9.3* 07/23/2014   HCT 29.1* 07/23/2014   MCV 95.4 07/23/2014   PLT 101 Occ Large platelets present* 07/23/2014   NEUTROABS 0.5* 06/19/2014     RADIOGRAPHIC STUDIES: I have personally reviewed the radiology reports and agreed with their findings. Nm  Pet Image Restag (ps) Skull Base To Thigh  07/22/2014   CLINICAL DATA:  Subsequent treatment strategy for metastatic breast cancer.  EXAM: NUCLEAR MEDICINE PET SKULL BASE TO THIGH  TECHNIQUE: 7.3 mCi F-18 FDG was injected intravenously. Full-ring PET imaging was performed from the skull base to thigh after the radiotracer. CT data was obtained and used for attenuation correction and anatomic localization.  FASTING BLOOD GLUCOSE:  Value: 88 mg/dl  COMPARISON:  03/04/2014.  FINDINGS: NECK  New hypermetabolic small lymph nodes are seen in the posterior supraclavicular regions bilaterally. Small hypermetabolic level II and III are also new.  CHEST  There is a new focal area of hypermetabolic uptake in the right hilum with SUV max = 6.1.  Dependent atelectasis is seen in the lower lobes bilaterally, but no pulmonary nodule or mass is evident.  ABDOMEN/PELVIS  Multiple new hypermetabolic lesions are seen in  the liver parenchyma. Although no underlying lesions can be identified on the uninfused CT images, imaging features are highly suspicious for interval development of metastatic disease. A posterior right hepatic dome lesion demonstrates SUV max = 8.4. Hypermetabolic lesion in the dome of the liver demonstrates SUV max = 6.1.  Both adrenal glands are now hypermetabolic can with right adrenal gland demonstrate SUV max = 7.3.  SKELETON  Widespread bony metastatic disease is again noted.  IMPRESSION: Interval development of hypermetabolic lymph nodes in the neck and chest concerning for metastatic progression.  Focal hypermetabolic areas of uptake in the liver parenchyma without underlying abnormality visible on CT imaging without contrast. The hypermetabolic foci are more prominent than typically seeing in normal mottled hepatic parenchymal uptake. CT scan with IV contrast or abdominal MRI without and with contrast could be used to further evaluate the liver as metastatic involvement is now concern.  Both adrenal glands are now hypermetabolic although no adrenal nodule or mass is evident. Continued attention on followup imaging is recommended as adrenal metastases cannot be excluded.  Widespread bony metastatic disease, as before.   Electronically Signed   By: Misty Stanley M.D.   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 Treated with Ibrance plus letrozole plus X Geva since 03/12/2014. Now progressing  Radiology counseling PET CT scan: Multiple new hypermetabolic small lymph nodes in the posterior supraclavicular region bilaterally, new right hilar focal area of  hypermetabolic uptake, multiple new hypermetabolic lesions in the liver parenchyma suspicious for metastatic disease, both adrenal glands are hypermetabolic and widespread bony metastases. This suggests progression of disease to [facility] with letrozole.  Recommendation: Systemic chemotherapy with [place] 2 weeks on 1 week off due to rapidly progressing disease.  [place] counseling: I discussed with her the risks of cytopenias, hypersensitivity reaction, peripheral neuropathy, tiredness, nausea vomiting, elevation of liver function tests, diarrhea, mucositis etc the patient understands these risks and is willing to proceed with the treatment. I will request a port placement by interventional radiology. I discussed with her to stop Ibrance at this time. X Geva will be changed to once every 3 months. Return to clinic on 08/09/2014 for chemotherapy.  Orders Placed This Encounter  Procedures  . CBC with Differential    Standing Status: Standing     Number of Occurrences:  20     Standing Expiration Date: 07/24/2015  . Comprehensive metabolic panel    Standing Status: Standing     Number of Occurrences: 20     Standing Expiration Date: 07/24/2015   The patient has a good understanding of the overall plan. she agrees with it. She will call with any problems that may develop before her next visit here.   Rulon Eisenmenger, MD

## 2014-07-23 NOTE — Addendum Note (Signed)
Addended by: Prentiss Bells on: 07/23/2014 05:31 PM   Modules accepted: Orders

## 2014-07-23 NOTE — Telephone Encounter (Signed)
appts made and avs pritned for pt  [name] °

## 2014-07-24 ENCOUNTER — Telehealth: Payer: Self-pay | Admitting: Hematology and Oncology

## 2014-07-24 NOTE — Telephone Encounter (Signed)
appts added and alter for 4/1 and pt will get a new sch on 3/25

## 2014-07-25 ENCOUNTER — Encounter: Payer: Self-pay | Admitting: *Deleted

## 2014-07-25 ENCOUNTER — Other Ambulatory Visit: Payer: 59

## 2014-07-26 ENCOUNTER — Other Ambulatory Visit: Payer: Self-pay

## 2014-07-26 ENCOUNTER — Encounter: Payer: Self-pay | Admitting: Gastroenterology

## 2014-07-26 DIAGNOSIS — C50111 Malignant neoplasm of central portion of right female breast: Secondary | ICD-10-CM

## 2014-07-26 MED ORDER — LIDOCAINE-PRILOCAINE 2.5-2.5 % EX CREA
TOPICAL_CREAM | CUTANEOUS | Status: AC
Start: 2014-07-26 — End: ?

## 2014-07-26 MED ORDER — LORAZEPAM 0.5 MG PO TABS: 0.5000 mg | ORAL_TABLET | Freq: Four times a day (QID) | ORAL | Status: DC | PRN

## 2014-07-26 MED ORDER — PROCHLORPERAZINE MALEATE 10 MG PO TABS: 10.0000 mg | ORAL_TABLET | Freq: Four times a day (QID) | ORAL | Status: AC | PRN

## 2014-07-26 MED ORDER — ONDANSETRON HCL 8 MG PO TABS: 8.0000 mg | ORAL_TABLET | Freq: Two times a day (BID) | ORAL | Status: AC

## 2014-07-26 NOTE — Telephone Encounter (Signed)
Patient requested home medications be moved to [place].  Escribed- receipt confirmed, ativan called in.

## 2014-07-31 ENCOUNTER — Other Ambulatory Visit: Payer: Self-pay | Admitting: Radiology

## 2014-08-01 ENCOUNTER — Telehealth: Payer: Self-pay | Admitting: *Deleted

## 2014-08-01 NOTE — Telephone Encounter (Signed)
Milana Kidney is assigned case manager   contact info P [phone] ext. [phone], fax [phone]

## 2014-08-02 ENCOUNTER — Ambulatory Visit (HOSPITAL_COMMUNITY)
Admission: RE | Admit: 2014-08-02 | Discharge: 2014-08-02 | Disposition: A | Discharge status: Home / Self Care | Payer: 59 | Source: Ambulatory Visit | Attending: Hematology and Oncology | Admitting: Hematology and Oncology

## 2014-08-02 ENCOUNTER — Other Ambulatory Visit: Payer: Self-pay | Admitting: Hematology and Oncology

## 2014-08-02 DIAGNOSIS — C50911 Malignant neoplasm of unspecified site of right female breast: Secondary | ICD-10-CM | POA: Diagnosis not present

## 2014-08-02 DIAGNOSIS — C50111 Malignant neoplasm of central portion of right female breast: Secondary | ICD-10-CM

## 2014-08-02 LAB — CBC WITH DIFFERENTIAL/PLATELET
BAND NEUTROPHILS: 2 % (ref 0–10)
BLASTS: 4 %
Basophils Absolute: 0.1 10*3/uL (ref 0.0–0.1)
Basophils Relative: 3 % — ABNORMAL HIGH (ref 0–1)
Eosinophils Absolute: 0 10*3/uL (ref 0.0–0.7)
Eosinophils Relative: 0 % (ref 0–5)
HEMATOCRIT: 28.6 % — AB (ref 36.0–46.0)
Hemoglobin: 8.9 g/dL — ABNORMAL LOW (ref 12.0–15.0)
LYMPHS PCT: 36 % (ref 12–46)
Lymphs Abs: 0.8 10*3/uL (ref 0.7–4.0)
MCH: 30.5 pg (ref 26.0–34.0)
MCHC: 31.1 g/dL (ref 30.0–36.0)
MCV: 97.9 fL (ref 78.0–100.0)
MONO ABS: 0.2 10*3/uL (ref 0.1–1.0)
MONOS PCT: 7 % (ref 3–12)
Metamyelocytes Relative: 2 %
Myelocytes: 8 %
NRBC: 0 /100{WBCs}
Neutro Abs: 1.2 10*3/uL — ABNORMAL LOW (ref 1.7–7.7)
Neutrophils Relative %: 38 % — ABNORMAL LOW (ref 43–77)
PLATELETS: 74 10*3/uL — AB (ref 150–400)
PROMYELOCYTES ABS: 0 %
RBC: 2.92 MIL/uL — ABNORMAL LOW (ref 3.87–5.11)
RDW: 19.3 % — AB (ref 11.5–15.5)
WBC: 2.3 10*3/uL — AB (ref 4.0–10.5)

## 2014-08-02 LAB — PROTIME-INR
INR: 0.94 (ref 0.00–1.49)
Prothrombin Time: 12.6 seconds (ref 11.6–15.2)

## 2014-08-02 LAB — PATHOLOGIST SMEAR REVIEW

## 2014-08-02 MED ORDER — LIDOCAINE-EPINEPHRINE 2 %-1:100000 IJ SOLN
INTRAMUSCULAR | Status: AC
  Filled 2014-08-02: qty 1

## 2014-08-02 MED ORDER — SODIUM CHLORIDE 0.9 % IV SOLN
INTRAVENOUS | Status: DC
  Administered 2014-08-02: 13:00:00 via INTRAVENOUS

## 2014-08-02 MED ORDER — VANCOMYCIN HCL IN DEXTROSE 1-5 GM/200ML-% IV SOLN
1000.0000 mg | Freq: Once | INTRAVENOUS | Status: AC
  Administered 2014-08-02: 1000 mg via INTRAVENOUS
  Filled 2014-08-02: qty 200

## 2014-08-02 MED ORDER — HEPARIN SOD (PORK) LOCK FLUSH 100 UNIT/ML IV SOLN
INTRAVENOUS | Status: AC
  Filled 2014-08-02: qty 5

## 2014-08-02 MED ORDER — FENTANYL CITRATE 0.05 MG/ML IJ SOLN
INTRAMUSCULAR | Status: AC
Start: 2014-08-02 — End: 2014-08-02
  Filled 2014-08-02: qty 4

## 2014-08-02 MED ORDER — FENTANYL CITRATE 0.05 MG/ML IJ SOLN
INTRAMUSCULAR | Status: AC | PRN
  Administered 2014-08-02 (×2): 50 ug via INTRAVENOUS

## 2014-08-02 MED ORDER — MIDAZOLAM HCL 2 MG/2ML IJ SOLN
INTRAMUSCULAR | Status: AC | PRN
  Administered 2014-08-02 (×2): 1 mg via INTRAVENOUS

## 2014-08-02 MED ORDER — MIDAZOLAM HCL 2 MG/2ML IJ SOLN
INTRAMUSCULAR | Status: AC
  Filled 2014-08-02: qty 6

## 2014-08-02 NOTE — Procedures (Signed)
Successful placement of left IJ approach port-a-cath with tip at the superior caval atrial junction. The catheter is ready for immediate use. No immediate post procedural complications. 

## 2014-08-02 NOTE — H&P (Signed)
Chief Complaint: "I'm getting a port a cath"  Referring Physician(s): Gudena,Vinay  History of Present Illness: [name] is a 59 y.o. female with history of metastatic breast carcinoma diagnosed in 2014 with prior right mastectomy. Recent PET scan has revealed progression of metastatic disease and she presents today for Port-A-Cath placement for chemotherapy.  Past Medical History  Diagnosis Date  . Ovarian cyst   . Breast calcification, left   . Diverticulitis     when it flares pt. uses Probiotic, also use baking soda & honey everyday   . Breast cancer     "right" (11/07/2012)  . S/P radiation therapy 09/26/13-11/08/13    right breast    Past Surgical History  Procedure Laterality Date  . Mastectomy modified radical Right 11/07/2012  . Vaginal hysterectomy  2012  . Vaginal delivery      x2  . 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 Breast biopsy Right 10/2012  . Mastectomy modified radical Right 11/07/2012    Procedure: MASTECTOMY MODIFIED RADICAL;  Surgeon: Adin Hector, MD;  Location: [facility];  Service: General;  Laterality: Right;  . Simple excision Right 09/03/2013    simple in office excision of mass right axilla    Allergies: Penicillins  Medications: Prior to Admission medications   Medication Sig Start Date End Date Taking? Authorizing Provider  Calcium Carbonate-Vit D-Min (CALCIUM 1200 PO) Take 1 tablet by mouth every morning.   Yes Historical Provider, MD  lidocaine-prilocaine (EMLA) cream Apply to affected area once 07/26/14  Yes Nicholas Lose, MD  LORazepam (ATIVAN) 0.5 MG tablet Take 1 tablet (0.5 mg total) by mouth every 6 (six) hours as needed for anxiety. 07/26/14  Yes Nicholas Lose, MD  Multiple Vitamins-Minerals (MULTIVITAMIN PO) Take 1 tablet by mouth every morning.    Yes Historical Provider, MD  ondansetron (ZOFRAN) 8 MG tablet Take 1 tablet (8 mg total) by mouth 2 (two) times daily. The day  after chemo for 2 days, then every 8 hours PRN for N/V 07/26/14  Yes Nicholas Lose, MD  OVER THE COUNTER MEDICATION Take 4 tablets by mouth every morning. Peptids supplements   Yes Historical Provider, MD  OVER THE COUNTER MEDICATION Take 4 tablets by mouth 2 (two) times daily. ([place])   Yes Historical Provider, MD  oxyCODONE-acetaminophen (PERCOCET/ROXICET) 5-325 MG per tablet 1-2 tabs every 6 hours as needed Patient taking differently: Take 1-2 tablets by mouth every 6 (six) hours as needed for moderate pain. 1-2 tabs every 6 hours as needed 07/11/14  Yes Chauncey Cruel, MD  prochlorperazine (COMPAZINE) 10 MG tablet Take 1 tablet (10 mg total) by mouth every 6 (six) hours as needed (Nausea or vomiting). 07/26/14  Yes Nicholas Lose, MD  traMADol (ULTRAM) 50 MG tablet Take 50 mg by mouth every 6 (six) hours as needed for moderate pain.    Historical Provider, MD  UNABLE TO FIND B2620 Silicone Breast Prosthesis, Rt quantity 1 L8020 Mastectomy form, Rt, quantity 1 L8000 Mastectomy bra, quantity 6    Historical Provider, MD  UNABLE TO FIND 174.9 Malignant Neoplasm   Mastectomy Right  L8030-Silicone Breast Prosthesis-1  L8000- Mastectomy Bra-2 11/01/13   Fanny Skates, MD    Family History  Problem Relation Age of Onset  . Cancer Mother     ovarian  . Cancer Brother     bone  . Colon cancer Neg Hx     History  Social History  . Marital Status: Single    Spouse Name: N/A  . Number of Children: 2  . Years of Education: N/A   Occupational History  .  [employer]    flexible nurse technician   Social History Main Topics  . Smoking status: Never Smoker   . Smokeless tobacco: Never Used  . Alcohol Use: No  . Drug Use: No  . Sexual Activity:    Partners: Male   Other Topics Concern  . Not on file   Social History Narrative      Review of Systems  Constitutional: Negative for fever and chills.  Respiratory: Negative for cough and shortness of breath.     Cardiovascular: Negative for chest pain.  Gastrointestinal: Negative for nausea, vomiting and abdominal pain.  Genitourinary: Negative for dysuria and hematuria.  Musculoskeletal: Negative for back pain.  Neurological: Negative for headaches.  Hematological: Does not bruise/bleed easily.    Vital Signs: Pulse 84  Temp(Src) 98.7 F (37.1 C) (Oral)  Resp 18  SpO2 100%  Physical Exam  Constitutional: She is oriented to person, place, and time. She appears well-developed and well-nourished.  Cardiovascular: Normal rate and regular rhythm.   Pulmonary/Chest: Effort normal and breath sounds normal.  Abdominal: Soft. Bowel sounds are normal. There is no tenderness.  Musculoskeletal: Normal range of motion. She exhibits no edema.  Neurological: She is oriented to person, place, and time.    Imaging: Nm Pet Image Restag (ps) Skull Base To Thigh  07/22/2014   CLINICAL DATA:  Subsequent treatment strategy for metastatic breast cancer.  EXAM: NUCLEAR MEDICINE PET SKULL BASE TO THIGH  TECHNIQUE: 7.3 mCi F-18 FDG was injected intravenously. Full-ring PET imaging was performed from the skull base to thigh after the radiotracer. CT data was obtained and used for attenuation correction and anatomic localization.  FASTING BLOOD GLUCOSE:  Value: 88 mg/dl  COMPARISON:  03/04/2014.  FINDINGS: NECK  New hypermetabolic small lymph nodes are seen in the posterior supraclavicular regions bilaterally. Small hypermetabolic level II and III are also new.  CHEST  There is a new focal area of hypermetabolic uptake in the right hilum with SUV max = 6.1.  Dependent atelectasis is seen in the lower lobes bilaterally, but no pulmonary nodule or mass is evident.  ABDOMEN/PELVIS  Multiple new hypermetabolic lesions are seen in the liver parenchyma. Although no underlying lesions can be identified on the uninfused CT images, imaging features are highly suspicious for interval development of metastatic disease. A posterior  right hepatic dome lesion demonstrates SUV max = 8.4. Hypermetabolic lesion in the dome of the liver demonstrates SUV max = 6.1.  Both adrenal glands are now hypermetabolic can with right adrenal gland demonstrate SUV max = 7.3.  SKELETON  Widespread bony metastatic disease is again noted.  IMPRESSION: Interval development of hypermetabolic lymph nodes in the neck and chest concerning for metastatic progression.  Focal hypermetabolic areas of uptake in the liver parenchyma without underlying abnormality visible on CT imaging without contrast. The hypermetabolic foci are more prominent than typically seeing in normal mottled hepatic parenchymal uptake. CT scan with IV contrast or abdominal MRI without and with contrast could be used to further evaluate the liver as metastatic involvement is now concern.  Both adrenal glands are now hypermetabolic although no adrenal nodule or mass is evident. Continued attention on followup imaging is recommended as adrenal metastases cannot be excluded.  Widespread bony metastatic disease, as before.   Electronically Signed   By: Randall Hiss  Tery Sanfilippo M.D.   On: 07/22/2014 11:40    Labs:  CBC:  Recent Labs  05/22/14 1402 06/19/14 1322 07/23/14 1519 08/02/14 1300  WBC 2.0* 1.3* 1.9* PENDING  HGB 10.2* 9.7* 9.3* 8.9*  HCT 32.8* 29.9* 29.1* 28.6*  PLT 324 145 101 Occ Large platelets present* PENDING    COAGS:  Recent Labs  03/04/14 0745 08/02/14 1300  INR 1.01 0.94  APTT 30  --     BMP:  Recent Labs  04/19/14 1314 05/16/14 1511 06/19/14 1322 07/23/14 1519  NA 142 136 141 139  K 4.5 4.2 3.9 4.3  CO2 21* 24 23 23   GLUCOSE 93 102 96 74  BUN 18.1 17.3 21.0 19.5  CALCIUM 8.6 9.2 8.8 9.5  CREATININE 0.8 0.9 0.9 0.9    LIVER FUNCTION TESTS:  Recent Labs  04/19/14 1314 05/16/14 1511 06/19/14 1322 07/23/14 1519  BILITOT 0.22 0.50 0.32 0.61  AST 24 99* 86* 92*  ALT 21 46 71* 50  ALKPHOS 391* 310* 311* 288*  PROT 6.9 7.4 7.2 7.3  ALBUMIN 3.4*  3.3* 3.3* 3.4*    TUMOR MARKERS: No results for input(s): AFPTM, CEA, CA199, CHROMGRNA in the last 8760 hours.  Assessment and Plan: [name] is a 59 y.o. female with history of metastatic breast carcinoma diagnosed in 2014 with prior right mastectomy. Recent PET scan has revealed progression of metastatic disease and she presents today for Port-A-Cath placement for chemotherapy. Risks and benefits discussed with the patient including, but not limited to bleeding, infection, pneumothorax, or fibrin sheath development and need for additional procedures. All of the patient's questions were answered, patient is agreeable to proceed. Consent signed and in chart.       Signed: Autumn Messing 08/02/2014, 2:16 PM   I spent a total of 20 minutes face to face in clinical consultation, greater than 50% of which was counseling/coordinating care for Port-A-Cath placement

## 2014-08-02 NOTE — Discharge Instructions (Signed)
Leave dressing on for 24 hours.  You may shower after 24 hours.  Please remove the dressing before you shower.   ° ° °Implanted Port Insertion, Care After °Refer to this sheet in the next few weeks. These instructions provide you with information on caring for yourself after your procedure. Your health care provider may also give you more specific instructions. Your treatment has been planned according to current medical practices, but problems sometimes occur. Call your health care provider if you have any problems or questions after your procedure. °WHAT TO EXPECT AFTER THE PROCEDURE °After your procedure, it is typical to have the following:  °· Discomfort at the port insertion site. Ice packs to the area will help. °· Bruising on the skin over the port. This will subside in 3-4 days. °HOME CARE INSTRUCTIONS °· After your port is placed, you will get a manufacturer's information card. The card has information about your port. Keep this card with you at all times.   °· Know what kind of port you have. There are many types of ports available.   °· Wear a medical alert bracelet in case of an emergency. This can help alert health care workers that you have a port.   °· The port can stay in for as long as your health care provider believes it is necessary.   °· A home health care nurse may give medicines and take care of the port.   °· You or a family member can get special training and directions for giving medicine and taking care of the port at home.   °SEEK MEDICAL CARE IF:  °· Your port does not flush or you are unable to get a blood return.   °· You have a fever or chills. °SEEK IMMEDIATE MEDICAL CARE IF: °· You have new fluid or pus coming from your incision.   °· You notice a bad smell coming from your incision site.   °· You have swelling, pain, or more redness at the incision or port site.   °· You have chest pain or shortness of breath. °Document Released: 02/21/2013 Document Revised: 05/08/2013 Document  Reviewed: 02/21/2013 °ExitCare® Patient Information ©2015 [employer]. This information is not intended to replace advice given to you by your health care provider. Make sure you discuss any questions you have with your health care provider. °Implanted Port Home Guide °An implanted port is a type of central line that is placed under the skin. Central lines are used to provide IV access when treatment or nutrition needs to be given through a person's veins. Implanted ports are used for long-term IV access. An implanted port may be placed because:  °· You need IV medicine that would be irritating to the small veins in your hands or arms.   °· You need long-term IV medicines, such as antibiotics.   °· You need IV nutrition for a long period.   °· You need frequent blood draws for lab tests.   °· You need dialysis.   °Implanted ports are usually placed in the chest area, but they can also be placed in the upper arm, the abdomen, or the leg. An implanted port has two main parts:  °· Reservoir. The reservoir is round and will appear as a small, raised area under your skin. The reservoir is the part where a needle is inserted to give medicines or draw blood.   °· Catheter. The catheter is a thin, flexible tube that extends from the reservoir. The catheter is placed into a large vein. Medicine that is inserted into the reservoir goes into   the catheter and then into the vein.   °HOW WILL I CARE FOR MY INCISION SITE? °Do not get the incision site wet. Bathe or shower as directed by your health care provider.  °HOW IS MY PORT ACCESSED? °Special steps must be taken to access the port:  °· Before the port is accessed, a numbing cream can be placed on the skin. This helps numb the skin over the port site.   °· Your health care provider uses a sterile technique to access the port. °· Your health care provider must put on a mask and sterile gloves. °· The skin over your port is cleaned carefully with an antiseptic and allowed to  dry. °· The port is gently pinched between sterile gloves, and a needle is inserted into the port. °· Only "non-coring" port needles should be used to access the port. Once the port is accessed, a blood return should be checked. This helps ensure that the port is in the vein and is not clogged.   °· If your port needs to remain accessed for a constant infusion, a clear (transparent) bandage will be placed over the needle site. The bandage and needle will need to be changed every week, or as directed by your health care provider.   °· Keep the bandage covering the needle clean and dry. Do not get it wet. Follow your health care provider's instructions on how to take a shower or bath while the port is accessed.   °· If your port does not need to stay accessed, no bandage is needed over the port.   °WHAT IS FLUSHING? °Flushing helps keep the port from getting clogged. Follow your health care provider's instructions on how and when to flush the port. Ports are usually flushed with saline solution or a medicine called heparin. The need for flushing will depend on how the port is used.  °· If the port is used for intermittent medicines or blood draws, the port will need to be flushed:   °· After medicines have been given.   °· After blood has been drawn.   °· As part of routine maintenance.   °· If a constant infusion is running, the port may not need to be flushed.   °HOW LONG WILL MY PORT STAY IMPLANTED? °The port can stay in for as long as your health care provider thinks it is needed. When it is time for the port to come out, surgery will be done to remove it. The procedure is similar to the one performed when the port was put in.  °WHEN SHOULD I SEEK IMMEDIATE MEDICAL CARE? °When you have an implanted port, you should seek immediate medical care if:  °· You notice a bad smell coming from the incision site.   °· You have swelling, redness, or drainage at the incision site.   °· You have more swelling or pain at the  port site or the surrounding area.   °· You have a fever that is not controlled with medicine. °Document Released: 05/03/2005 Document Revised: 02/21/2013 Document Reviewed: 01/08/2013 °ExitCare® Patient Information ©2015 [employer]. This information is not intended to replace advice given to you by your health care provider. Make sure you discuss any questions you have with your health care provider. °Conscious Sedation, Adult, Care After °Refer to this sheet in the next few weeks. These instructions provide you with information on caring for yourself after your procedure. Your health care provider may also give you more specific instructions. Your treatment has been planned according to current medical practices, but problems sometimes occur.   Call your health care provider if you have any problems or questions after your procedure. °WHAT TO EXPECT AFTER THE PROCEDURE  °After your procedure: °· You may feel sleepy, clumsy, and have poor balance for several hours. °· Vomiting may occur if you eat too soon after the procedure. °HOME CARE INSTRUCTIONS °· Do not participate in any activities where you could become injured for at least 24 hours. Do not: °¨ Drive. °¨ Swim. °¨ Ride a bicycle. °¨ Operate heavy machinery. °¨ Cook. °¨ Use power tools. °¨ Climb ladders. °¨ Work from a high place. °· Do not make important decisions or sign legal documents until you are improved. °· If you vomit, drink water, juice, or soup when you can drink without vomiting. Make sure you have little or no nausea before eating solid foods. °· Only take over-the-counter or prescription medicines for pain, discomfort, or fever as directed by your health care provider. °· Make sure you and your family fully understand everything about the medicines given to you, including what side effects may occur. °· You should not drink alcohol, take sleeping pills, or take medicines that cause drowsiness for at least 24 hours. °· If you smoke, do not  smoke without supervision. °· If you are feeling better, you may resume normal activities 24 hours after you were sedated. °· Keep all appointments with your health care provider. °SEEK MEDICAL CARE IF: °· Your skin is pale or bluish in color. °· You continue to feel nauseous or vomit. °· Your pain is getting worse and is not helped by medicine. °· You have bleeding or swelling. °· You are still sleepy or feeling clumsy after 24 hours. °SEEK IMMEDIATE MEDICAL CARE IF: °· You develop a rash. °· You have difficulty breathing. °· You develop any type of allergic problem. °· You have a fever. °MAKE SURE YOU: °· Understand these instructions. °· Will watch your condition. °· Will get help right away if you are not doing well or get worse. °Document Released: 02/21/2013 Document Reviewed: 02/21/2013 °ExitCare® Patient Information ©2015 [employer]. This information is not intended to replace advice given to you by your health care provider. Make sure you discuss any questions you have with your health care provider. ° ° ° °

## 2014-08-09 ENCOUNTER — Other Ambulatory Visit: Payer: Self-pay | Admitting: Hematology and Oncology

## 2014-08-09 ENCOUNTER — Other Ambulatory Visit (HOSPITAL_BASED_OUTPATIENT_CLINIC_OR_DEPARTMENT_OTHER): Payer: 59

## 2014-08-09 ENCOUNTER — Ambulatory Visit (HOSPITAL_BASED_OUTPATIENT_CLINIC_OR_DEPARTMENT_OTHER): Payer: 59

## 2014-08-09 ENCOUNTER — Telehealth: Payer: Self-pay | Admitting: Hematology and Oncology

## 2014-08-09 ENCOUNTER — Ambulatory Visit (HOSPITAL_BASED_OUTPATIENT_CLINIC_OR_DEPARTMENT_OTHER): Payer: 59 | Admitting: Hematology and Oncology

## 2014-08-09 DIAGNOSIS — C50811 Malignant neoplasm of overlapping sites of right female breast: Secondary | ICD-10-CM | POA: Diagnosis not present

## 2014-08-09 DIAGNOSIS — C7951 Secondary malignant neoplasm of bone: Secondary | ICD-10-CM

## 2014-08-09 DIAGNOSIS — D649 Anemia, unspecified: Secondary | ICD-10-CM | POA: Diagnosis not present

## 2014-08-09 DIAGNOSIS — E279 Disorder of adrenal gland, unspecified: Secondary | ICD-10-CM

## 2014-08-09 DIAGNOSIS — Z5111 Encounter for antineoplastic chemotherapy: Secondary | ICD-10-CM | POA: Diagnosis not present

## 2014-08-09 DIAGNOSIS — C773 Secondary and unspecified malignant neoplasm of axilla and upper limb lymph nodes: Secondary | ICD-10-CM

## 2014-08-09 DIAGNOSIS — C50111 Malignant neoplasm of central portion of right female breast: Secondary | ICD-10-CM

## 2014-08-09 DIAGNOSIS — D701 Agranulocytosis secondary to cancer chemotherapy: Secondary | ICD-10-CM

## 2014-08-09 DIAGNOSIS — D696 Thrombocytopenia, unspecified: Secondary | ICD-10-CM

## 2014-08-09 DIAGNOSIS — K769 Liver disease, unspecified: Secondary | ICD-10-CM | POA: Diagnosis not present

## 2014-08-09 LAB — COMPREHENSIVE METABOLIC PANEL (CC13)
ALT: 92 U/L — ABNORMAL HIGH (ref 0–55)
AST: 169 U/L — ABNORMAL HIGH (ref 5–34)
Albumin: 3.2 g/dL — ABNORMAL LOW (ref 3.5–5.0)
Alkaline Phosphatase: 546 U/L — ABNORMAL HIGH (ref 40–150)
Anion Gap: 14 mEq/L — ABNORMAL HIGH (ref 3–11)
BILIRUBIN TOTAL: 1.09 mg/dL (ref 0.20–1.20)
BUN: 16.9 mg/dL (ref 7.0–26.0)
CALCIUM: 8.9 mg/dL (ref 8.4–10.4)
CO2: 21 meq/L — AB (ref 22–29)
CREATININE: 0.8 mg/dL (ref 0.6–1.1)
Chloride: 106 mEq/L (ref 98–109)
EGFR: 77 mL/min/{1.73_m2} — ABNORMAL LOW (ref >90)
GLUCOSE: 146 mg/dL — AB (ref 70–140)
Potassium: 3.7 mEq/L (ref 3.5–5.1)
Sodium: 141 mEq/L (ref 136–145)
Total Protein: 7.1 g/dL (ref 6.4–8.3)

## 2014-08-09 LAB — MANUAL DIFFERENTIAL
ALC: 1.5 10*3/uL (ref 0.9–3.3)
ANC (CHCC manual diff): 2.1 10*3/uL (ref 1.5–6.5)
BASOPHIL: 1 % (ref 0–2)
Band Neutrophils: 0 % (ref 0–10)
Blasts: 1 % — ABNORMAL HIGH (ref 0–0)
EOS%: 0 % (ref 0–7)
LYMPH: 39 % (ref 14–49)
METAMYELOCYTES PCT: 6 % — AB (ref 0–0)
MONO: 4 % (ref 0–14)
MYELOCYTES: 3 % — AB (ref 0–0)
Other Cell: 0 % (ref 0–0)
PLT EST: DECREASED
PROMYELO: 0 % (ref 0–0)
SEG: 46 % (ref 38–77)
Variant Lymph: 0 % (ref 0–0)
nRBC: 78 % — ABNORMAL HIGH (ref 0–0)

## 2014-08-09 LAB — CBC WITH DIFFERENTIAL/PLATELET
HCT: 28.7 % — ABNORMAL LOW (ref 34.8–46.6)
HGB: 8.9 g/dL — ABNORMAL LOW (ref 11.6–15.9)
MCH: 30.6 pg (ref 25.1–34.0)
MCHC: 31 g/dL — ABNORMAL LOW (ref 31.5–36.0)
MCV: 98.6 fL (ref 79.5–101.0)
Platelets: 62 10*3/uL — ABNORMAL LOW (ref 145–400)
RBC: 2.91 10*6/uL — ABNORMAL LOW (ref 3.70–5.45)
RDW: 20.3 % — AB (ref 11.2–14.5)
WBC: 3.8 10*3/uL — ABNORMAL LOW (ref 3.9–10.3)

## 2014-08-09 MED ORDER — SODIUM CHLORIDE 0.9 % IJ SOLN
10.0000 mL | INTRAMUSCULAR | Status: DC | PRN
  Administered 2014-08-09: 10 mL
  Filled 2014-08-09: qty 10

## 2014-08-09 MED ORDER — SODIUM CHLORIDE 0.9 % IV SOLN
1.1500 mg/m2 | Freq: Once | INTRAVENOUS | Status: AC
  Administered 2014-08-09: 2 mg via INTRAVENOUS
  Filled 2014-08-09: qty 4

## 2014-08-09 MED ORDER — HEPARIN SOD (PORK) LOCK FLUSH 100 UNIT/ML IV SOLN
500.0000 [IU] | Freq: Once | INTRAVENOUS | Status: AC | PRN
  Administered 2014-08-09: 500 [IU]
  Filled 2014-08-09: qty 5

## 2014-08-09 MED ORDER — SODIUM CHLORIDE 0.9 % IV SOLN
Freq: Once | INTRAVENOUS | Status: AC
  Administered 2014-08-09: 11:00:00 via INTRAVENOUS
  Filled 2014-08-09: qty 4

## 2014-08-09 MED ORDER — SODIUM CHLORIDE 0.9 % IV SOLN
Freq: Once | INTRAVENOUS | Status: AC
  Administered 2014-08-09: 11:00:00 via INTRAVENOUS

## 2014-08-09 NOTE — Assessment & Plan Note (Signed)
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 Treated with Ibrance plus letrozole plus X Geva since 03/12/2014. PET/CT 07/22/2014 revealed multiple lymph nodes, multiple liver lesions, adrenal lesions, widespread bone metastases, starting Halaven and 08/09/2014  Chemotherapy monitoring: 1. I went over the antiemetic regimen great detail 2. Return to clinic in one week to see her nurse practitioner to go over her blood counts.  Elevated AST and ALT and alkaline phosphatase: Due to liver involvement. In spite of the abnormal liver tests I recommended administering chemotherapy. She will need to be followed up closely on her liver function tests.  Thrombocytopenia: Most likely related to bone marrow involvement by her breast cancer. We have discussed the risks of doing chemotherapy with low platelet count. We need to treat her with systemic chemotherapy to decrease her disease burden. Hence we are treating her with chemotherapy in spite of her low platelet count.  Return to clinic in 1 week for follow-up

## 2014-08-09 NOTE — Progress Notes (Signed)
It is noted that  Pt's plaetlets are  At 62. Liver  Panel.Marland KitchenMarland KitchenMarland KitchenAkaline Phospatase at 546. Dr. Lindi Adie notes review ...according to notes.... Lindi Adie has consented to treat despite abnormal levels.

## 2014-08-09 NOTE — Telephone Encounter (Signed)
Patient already on schedule lb/fu/tx next week. Gave patient avs report and appts for 4/1.

## 2014-08-09 NOTE — Progress Notes (Signed)
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   11/09/2012 Surgery 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 one out of 13 lymph nodes positive ER 100%, PR 29%, HER-2 negative ratio 0.93 Ki-67 89%   09/26/2013 - 11/08/2013 Radiation Therapy Radiation to right chest wall and right supraclavicular region to a dose of 50.4 gray at 1.8 gray per fraction    Anti-estrogen oral therapy Patient refused antiestrogen therapy and chemotherapy, receives Blue crystal treatment from [place] (patch that sucks out impurities)   02/21/2014 PET scan Diffuse bone metastases involving the axial and appendicular skeleton, biopsy of the left iliac bone positive for metastatic breast cancer   03/12/2014 Treatment Plan Change Ibrance with letrozole and Xgeva    07/22/2014 PET scan New hypermetabolic lymph nodes in the neck and chest, new abnormalities in the liver without underlying CT imaging abnormality, both adrenal glands are hypermetabolic widespread bony metastases as before    CHIEF COMPLIANT:   Halaven Day 1 cycle 1 ( days 1 and 8 every 3 weeks)  INTERVAL HISTORY: [name] is a  59 year old lady with above-mentioned history of metastatic breast cancer  Who progressed on Ibrance with letrozole and is here today to start first cycle of chemotherapy with Halaven.  Today cycle 1 day 1 of  treatment. She continues to have some low back pain but otherwise asymptomatic. She had a port placement and it went well.  REVIEW OF SYSTEMS:   Constitutional: Denies fevers, chills or abnormal weight loss Eyes: Denies blurriness of vision Ears, nose, mouth, throat, and face: Denies mucositis or sore throat Respiratory: Denies cough, dyspnea or wheezes Cardiovascular: Denies palpitation, chest discomfort or lower extremity swelling Gastrointestinal:  Denies nausea, heartburn or change in bowel habits Skin: Denies abnormal skin rashes Lymphatics: Denies new lymphadenopathy or easy bruising Neurological:Denies numbness, tingling or new weaknesses Behavioral/Psych: Mood is stable, no new changes  All other systems were reviewed with the patient and are negative.  I have reviewed the past medical history, past surgical history, social history and family history with the patient and they are unchanged from previous note.  ALLERGIES:  is allergic to penicillins.  MEDICATIONS:  Current Outpatient Prescriptions  Medication Sig Dispense Refill  . Calcium Carbonate-Vit D-Min (CALCIUM 1200 PO) Take 1 tablet by mouth every morning.    . lidocaine-prilocaine (EMLA) cream Apply to affected area once 30 g 3  . LORazepam (ATIVAN) 0.5 MG tablet Take 1 tablet (0.5 mg total) by mouth every 6 (six) hours as needed for anxiety. 30 tablet 0  . Multiple Vitamins-Minerals (MULTIVITAMIN PO) Take 1 tablet by mouth every morning.     . ondansetron (ZOFRAN) 8 MG tablet Take 1 tablet (8 mg total) by mouth 2 (two) times daily. The day after chemo for 2 days, then every 8 hours PRN for N/V 30 tablet 1  .  OVER THE COUNTER MEDICATION Take 4 tablets by mouth every morning. Peptids supplements    . OVER THE COUNTER MEDICATION Take 4 tablets by mouth 2 (two) times daily. ([place])    . oxyCODONE-acetaminophen (PERCOCET/ROXICET) 5-325 MG per tablet 1-2 tabs every 6 hours as needed (Patient taking differently: Take 1-2  tablets by mouth every 6 (six) hours as needed for moderate pain. 1-2 tabs every 6 hours as needed) 30 tablet 0  . prochlorperazine (COMPAZINE) 10 MG tablet Take 1 tablet (10 mg total) by mouth every 6 (six) hours as needed (Nausea or vomiting). 30 tablet 1  . traMADol (ULTRAM) 50 MG tablet Take 50 mg by mouth every 6 (six) hours as needed for moderate pain.    Marland Kitchen UNABLE TO FIND N0037 Silicone Breast Prosthesis, Rt quantity 1 L8020 Mastectomy form, Rt, quantity 1 L8000 Mastectomy bra, quantity 6    . UNABLE TO FIND 174.9 Malignant Neoplasm   Mastectomy Right  L8030-Silicone Breast Prosthesis-1  L8000- Mastectomy Bra-2 1 each 0   No current facility-administered medications for this visit.   Facility-Administered Medications Ordered in Other Visits  Medication Dose Route Frequency Provider Last Rate Last Dose  . sodium chloride 0.9 % injection 10 mL  10 mL Intracatheter PRN Nicholas Lose, MD   10 mL at 08/09/14 1156    PHYSICAL EXAMINATION: ECOG PERFORMANCE STATUS: 1 - Symptomatic but completely ambulatory  Filed Vitals:   08/09/14 0941  BP: 132/57  Pulse: 100  Temp: 97.6 F (36.4 C)  Resp: 18   Filed Weights   08/09/14 0941  Weight: 145 lb 3 oz (65.857 kg)    GENERAL:alert, no distress and comfortable SKIN: skin color, texture, turgor are normal, no rashes or significant lesions EYES: normal, Conjunctiva are pink and non-injected, sclera clear OROPHARYNX:no exudate, no erythema and lips, buccal mucosa, and tongue normal  NECK: supple, thyroid normal size, non-tender, without nodularity LYMPH:  no palpable lymphadenopathy in the cervical, axillary or inguinal LUNGS: clear to auscultation and percussion with normal breathing effort HEART: regular rate & rhythm and no murmurs and no lower extremity edema ABDOMEN:abdomen soft, non-tender and normal bowel sounds Musculoskeletal:no cyanosis of digits and no clubbing  NEURO: alert & oriented x 3 with fluent speech, no focal  motor/sensory deficits  LABORATORY DATA:  I have reviewed the data as listed   Chemistry      Component Value Date/Time   NA 141 08/09/2014 0931   NA 139 11/08/2012 0620   K 3.7 08/09/2014 0931   K 4.2 11/08/2012 0620   CL 105 11/08/2012 0620   CL 106 10/23/2012 1459   CO2 21* 08/09/2014 0931   CO2 30 11/08/2012 0620   BUN 16.9 08/09/2014 0931   BUN 8 11/08/2012 0620   CREATININE 0.8 08/09/2014 0931   CREATININE 0.74 11/08/2012 0620   CREATININE 0.82 08/18/2011 1056      Component Value Date/Time   CALCIUM 8.9 08/09/2014 0931   CALCIUM 9.0 11/08/2012 0620   ALKPHOS 546* 08/09/2014 0931   ALKPHOS 96 11/06/2012 0850   AST 169* 08/09/2014 0931   AST 15 11/06/2012 0850   ALT 92* 08/09/2014 0931   ALT 14 11/06/2012 0850   BILITOT 1.09 08/09/2014 0931   BILITOT 0.4 11/06/2012 0850       Lab Results  Component Value Date   WBC 3.8* 08/09/2014   HGB 8.9* 08/09/2014   HCT 28.7* 08/09/2014   MCV 98.6 08/09/2014   PLT 62* 08/09/2014   NEUTROABS 1.2* 08/02/2014  ASSESSMENT & PLAN:  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 Treated with Ibrance plus letrozole plus X Geva since 03/12/2014. PET/CT 07/22/2014 revealed multiple lymph nodes, multiple liver lesions, adrenal lesions, widespread bone metastases, starting Halaven and 08/09/2014  Chemotherapy monitoring: 1. I went over the antiemetic regimen great detail 2. Return to clinic in one week to see her nurse practitioner to go over her blood counts.  Elevated AST and ALT and alkaline phosphatase: Due to liver involvement. In spite of the abnormal liver tests I recommended administering chemotherapy. She will need to be  followed up closely on her liver function tests.  Thrombocytopenia: Most likely related to bone marrow involvement by her breast cancer. We have discussed the risks of doing chemotherapy with low platelet count. We need to treat her with systemic chemotherapy to decrease her disease burden. Hence we are treating her with chemotherapy in spite of her low platelet count.  Neutropenia: ANC 1200. I discussed with her once again that it is probably related to bone marrow suppression from metastatic breast cancer and that we have to treat her respiratory for blood counts initially.  Anemia hemoglobin 8.9 : If her hemoglobin drops below 7 she might need blood transfusion. Anemia due to chemotherapy or metastatic breast cancer. Return to clinic in 1 week for follow-up    Orders Placed This Encounter  Procedures  . CBC with Differential    Standing Status: Future     Number of Occurrences:      Standing Expiration Date: 08/09/2015  . Comprehensive metabolic panel (Cmet) - [facility]    Standing Status: Future     Number of Occurrences:      Standing Expiration Date: 08/09/2015   The patient has a good understanding of the overall plan. she agrees with it. She will call with any problems that may develop before her next visit here.   Rulon Eisenmenger, MD

## 2014-08-09 NOTE — Patient Instructions (Signed)
[facility] Discharge Instructions for Patients Receiving Chemotherapy  Today you received the following chemotherapy agents Haleven  To help prevent nausea and vomiting after your treatment, we encourage you to take your nausea medication Zofran as directed   If you develop nausea and vomiting that is not controlled by your nausea medication, call the clinic.   BELOW ARE SYMPTOMS THAT SHOULD BE REPORTED IMMEDIATELY:  *FEVER GREATER THAN 100.5 F  *CHILLS WITH OR WITHOUT FEVER  NAUSEA AND VOMITING THAT IS NOT CONTROLLED WITH YOUR NAUSEA MEDICATION  *UNUSUAL SHORTNESS OF BREATH  *UNUSUAL BRUISING OR BLEEDING  TENDERNESS IN MOUTH AND THROAT WITH OR WITHOUT PRESENCE OF ULCERS  *URINARY PROBLEMS  *BOWEL PROBLEMS  UNUSUAL RASH Items with * indicate a potential emergency and should be followed up as soon as possible.  Feel free to call the clinic you have any questions or concerns. The clinic phone number is [phone].  Please show the [place] at check-in to the Emergency Department and triage nurse.

## 2014-08-09 NOTE — Progress Notes (Signed)
Ok to treat per Dr. Lindi Adie.

## 2014-08-12 ENCOUNTER — Telehealth: Payer: Self-pay | Admitting: *Deleted

## 2014-08-12 NOTE — Telephone Encounter (Signed)
Called patient for 1st Halaven. Patient states she feels a little tired but eating and drinking without problems. Denies nausea, vomiting or diarrhea. Patient advised to call with any questions or concerns. Patient verbalized understanding.

## 2014-08-15 ENCOUNTER — Inpatient Hospital Stay: Admission: RE | Admit: 2014-08-15 | Payer: 59 | Source: Ambulatory Visit

## 2014-08-16 ENCOUNTER — Ambulatory Visit: Payer: 59 | Admitting: Hematology and Oncology

## 2014-08-16 ENCOUNTER — Other Ambulatory Visit (HOSPITAL_BASED_OUTPATIENT_CLINIC_OR_DEPARTMENT_OTHER): Payer: 59

## 2014-08-16 ENCOUNTER — Ambulatory Visit (HOSPITAL_BASED_OUTPATIENT_CLINIC_OR_DEPARTMENT_OTHER): Payer: 59

## 2014-08-16 ENCOUNTER — Telehealth: Payer: Self-pay | Admitting: Hematology and Oncology

## 2014-08-16 ENCOUNTER — Other Ambulatory Visit: Payer: 59

## 2014-08-16 ENCOUNTER — Encounter: Payer: Self-pay | Admitting: Nurse Practitioner

## 2014-08-16 ENCOUNTER — Telehealth: Payer: Self-pay

## 2014-08-16 ENCOUNTER — Ambulatory Visit (HOSPITAL_BASED_OUTPATIENT_CLINIC_OR_DEPARTMENT_OTHER): Payer: 59 | Admitting: Nurse Practitioner

## 2014-08-16 ENCOUNTER — Ambulatory Visit: Payer: 59

## 2014-08-16 VITALS — BP 124/55 | HR 111 | Temp 98.1°F | Resp 18 | Ht 65.0 in | Wt 141.3 lb

## 2014-08-16 DIAGNOSIS — Z5111 Encounter for antineoplastic chemotherapy: Secondary | ICD-10-CM | POA: Diagnosis not present

## 2014-08-16 DIAGNOSIS — Z5189 Encounter for other specified aftercare: Secondary | ICD-10-CM

## 2014-08-16 DIAGNOSIS — R748 Abnormal levels of other serum enzymes: Secondary | ICD-10-CM

## 2014-08-16 DIAGNOSIS — C50111 Malignant neoplasm of central portion of right female breast: Secondary | ICD-10-CM

## 2014-08-16 DIAGNOSIS — K5900 Constipation, unspecified: Secondary | ICD-10-CM

## 2014-08-16 DIAGNOSIS — D61810 Antineoplastic chemotherapy induced pancytopenia: Secondary | ICD-10-CM | POA: Insufficient documentation

## 2014-08-16 DIAGNOSIS — T451X5A Adverse effect of antineoplastic and immunosuppressive drugs, initial encounter: Secondary | ICD-10-CM

## 2014-08-16 DIAGNOSIS — K1231 Oral mucositis (ulcerative) due to antineoplastic therapy: Secondary | ICD-10-CM | POA: Diagnosis not present

## 2014-08-16 LAB — COMPREHENSIVE METABOLIC PANEL (CC13)
ALT: 81 U/L — ABNORMAL HIGH (ref 0–55)
ANION GAP: 13 meq/L — AB (ref 3–11)
AST: 128 U/L — ABNORMAL HIGH (ref 5–34)
Albumin: 2.9 g/dL — ABNORMAL LOW (ref 3.5–5.0)
Alkaline Phosphatase: 544 U/L — ABNORMAL HIGH (ref 40–150)
BUN: 9.4 mg/dL (ref 7.0–26.0)
CALCIUM: 8.1 mg/dL — AB (ref 8.4–10.4)
CO2: 22 mEq/L (ref 22–29)
CREATININE: 0.7 mg/dL (ref 0.6–1.1)
Chloride: 106 mEq/L (ref 98–109)
EGFR: 88 mL/min/{1.73_m2} — ABNORMAL LOW (ref >90)
GLUCOSE: 134 mg/dL (ref 70–140)
POTASSIUM: 3.6 meq/L (ref 3.5–5.1)
Sodium: 141 mEq/L (ref 136–145)
Total Bilirubin: 1.06 mg/dL (ref 0.20–1.20)
Total Protein: 6.5 g/dL (ref 6.4–8.3)

## 2014-08-16 LAB — MANUAL DIFFERENTIAL
ALC: 2.4 10*3/uL (ref 0.9–3.3)
ANC (CHCC manual diff): 1 10*3/uL — ABNORMAL LOW (ref 1.5–6.5)
Blasts: 3 % — ABNORMAL HIGH (ref 0–0)
LYMPH: 64 % — ABNORMAL HIGH (ref 14–49)
METAMYELOCYTES PCT: 4 % — AB (ref 0–0)
MONO: 6 % (ref 0–14)
Myelocytes: 5 % — ABNORMAL HIGH (ref 0–0)
PLT EST: DECREASED
SEG: 18 % — ABNORMAL LOW (ref 38–77)
nRBC: 182 % — ABNORMAL HIGH (ref 0–0)

## 2014-08-16 LAB — CBC WITH DIFFERENTIAL/PLATELET
HCT: 26.9 % — ABNORMAL LOW (ref 34.8–46.6)
HGB: 8.4 g/dL — ABNORMAL LOW (ref 11.6–15.9)
MCH: 30.4 pg (ref 25.1–34.0)
MCHC: 31.2 g/dL — ABNORMAL LOW (ref 31.5–36.0)
MCV: 97.5 fL (ref 79.5–101.0)
Platelets: 47 10*3/uL — ABNORMAL LOW (ref 145–400)
RBC: 2.76 10*6/uL — AB (ref 3.70–5.45)
RDW: 20.2 % — ABNORMAL HIGH (ref 11.2–14.5)
WBC: 3.8 10*3/uL — ABNORMAL LOW (ref 3.9–10.3)

## 2014-08-16 MED ORDER — SODIUM CHLORIDE 0.9 % IV SOLN
Freq: Once | INTRAVENOUS | Status: AC
  Administered 2014-08-16: 11:00:00 via INTRAVENOUS
  Filled 2014-08-16: qty 4

## 2014-08-16 MED ORDER — MAGIC MOUTHWASH W/LIDOCAINE: 5.0000 mL | Freq: Four times a day (QID) | ORAL | Status: DC | PRN

## 2014-08-16 MED ORDER — HEPARIN SOD (PORK) LOCK FLUSH 100 UNIT/ML IV SOLN
500.0000 [IU] | Freq: Once | INTRAVENOUS | Status: AC | PRN
  Administered 2014-08-16: 500 [IU]
  Filled 2014-08-16: qty 5

## 2014-08-16 MED ORDER — SODIUM CHLORIDE 0.9 % IJ SOLN
10.0000 mL | INTRAMUSCULAR | Status: DC | PRN
  Administered 2014-08-16: 10 mL
  Filled 2014-08-16: qty 10

## 2014-08-16 MED ORDER — PEGFILGRASTIM 6 MG/0.6ML ~~LOC~~ PSKT
6.0000 mg | PREFILLED_SYRINGE | Freq: Once | SUBCUTANEOUS | Status: AC
  Administered 2014-08-16: 6 mg via SUBCUTANEOUS
  Filled 2014-08-16: qty 0.6

## 2014-08-16 MED ORDER — ERIBULIN MESYLATE CHEMO INJECTION 1 MG/2ML: 1.2000 mg/m2 | Freq: Once | INTRAVENOUS | Status: DC

## 2014-08-16 MED ORDER — SODIUM CHLORIDE 0.9 % IV SOLN
Freq: Once | INTRAVENOUS | Status: AC
  Administered 2014-08-16: 11:00:00 via INTRAVENOUS

## 2014-08-16 MED ORDER — SODIUM CHLORIDE 0.9 % IV SOLN
2.0000 mg | Freq: Once | INTRAVENOUS | Status: AC
  Administered 2014-08-16: 2 mg via INTRAVENOUS
  Filled 2014-08-16: qty 4

## 2014-08-16 NOTE — Progress Notes (Signed)
OK to treat per Nira Conn, PA and Dr. Jana Hakim.

## 2014-08-16 NOTE — Telephone Encounter (Signed)
Added lab for 4/7 per 2nd 4/1 pof. Patient given new schedule while in infusion.

## 2014-08-16 NOTE — Telephone Encounter (Signed)
Gave patient avs report and appointments for April  °

## 2014-08-16 NOTE — Telephone Encounter (Signed)
Sheryl pharmacist from [facility] outpatient pharmacy called to clarify MMW order. OK to use lidocaine, maalox, nystatin, benadryl formula per Heather.

## 2014-08-16 NOTE — Patient Instructions (Signed)
[facility] Discharge Instructions for Patients Receiving Chemotherapy  Today you received the following chemotherapy agents: Haloven.  To help prevent nausea and vomiting after your treatment, we encourage you to take your nausea medication: Zofran 8mg  every 8 hours as needed.  If you develop nausea and vomiting that is not controlled by your nausea medication, call the clinic.   BELOW ARE SYMPTOMS THAT SHOULD BE REPORTED IMMEDIATELY:  *FEVER GREATER THAN 100.5 F  *CHILLS WITH OR WITHOUT FEVER  NAUSEA AND VOMITING THAT IS NOT CONTROLLED WITH YOUR NAUSEA MEDICATION  *UNUSUAL SHORTNESS OF BREATH  *UNUSUAL BRUISING OR BLEEDING  TENDERNESS IN MOUTH AND THROAT WITH OR WITHOUT PRESENCE OF ULCERS  *URINARY PROBLEMS  *BOWEL PROBLEMS  UNUSUAL RASH Items with * indicate a potential emergency and should be followed up as soon as possible.  Feel free to call the clinic you have any questions or concerns. The clinic phone number is [phone].  Please show the [place] at check-in to the Emergency Department and triage nurse.

## 2014-08-16 NOTE — Progress Notes (Signed)
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   11/09/2012 Surgery 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 one out of 13 lymph nodes positive ER 100%, PR 29%, HER-2 negative ratio 0.93 Ki-67 89%   09/26/2013 - 11/08/2013 Radiation Therapy Radiation to right chest wall and right supraclavicular region to a dose of 50.4 gray at 1.8 gray per fraction    Anti-estrogen oral therapy Patient refused antiestrogen therapy and chemotherapy, receives Blue crystal treatment from [place] (patch that sucks out impurities)   02/21/2014 PET scan Diffuse bone metastases involving the axial and appendicular skeleton, biopsy of the left iliac bone positive for metastatic breast cancer   03/12/2014 Treatment Plan Change Ibrance with letrozole and Xgeva    07/22/2014 PET scan New hypermetabolic lymph nodes in the neck and chest, new abnormalities in the liver without underlying CT imaging abnormality, both adrenal glands are hypermetabolic widespread bony metastases as before    CHIEF COMPLIANT:   Halaven Day 8 cycle 1 ( days 1 and 8 every 3 weeks)  INTERVAL HISTORY: [name] is a  59 year old lady with above-mentioned history of metastatic breast cancer  Today she is due for day 8 of halaven. She tolerated the first dose relatively well. She denies fevers, chills, nausea, or vomiting. She  has some constipation and drinks plenty of water and prune juice, but has not taken any stool softeners or miralax for this. She has some mouth sores to her inner left buccal mucosa and has tried baking soda rinses with no improvement.  REVIEW OF SYSTEMS:   Constitutional: Denies fevers, chills or abnormal weight loss Eyes: Denies blurriness of vision Ears, nose, mouth, throat, and face: Denies  sore throat Respiratory: Denies cough, dyspnea or wheezes Cardiovascular: Denies palpitation, chest discomfort or lower extremity swelling Gastrointestinal:  Denies nausea or heartburn, complains of constipation Skin: Denies abnormal skin rashes Lymphatics: Denies new lymphadenopathy or easy bruising Neurological:Denies numbness, tingling or new weaknesses Behavioral/Psych: Mood is stable, no new changes  All other systems were reviewed with the patient and are negative.  I have reviewed the past medical history, past surgical history, social history and family history with the patient and they are unchanged from previous note.  ALLERGIES:  is allergic to penicillins.  MEDICATIONS:  Current Outpatient Prescriptions  Medication Sig Dispense Refill  . Calcium Carbonate-Vit D-Min (CALCIUM 1200 PO) Take 1 tablet by mouth every morning.    . lidocaine-prilocaine (EMLA) cream Apply to affected area once 30 g 3  . LORazepam (ATIVAN) 0.5 MG tablet TAKE 1 TABLET EVERY 6 HOURS AS NEEDED 30 tablet 0  . Multiple Vitamins-Minerals (MULTIVITAMIN PO) Take 1 tablet by mouth every morning.     . ondansetron (ZOFRAN) 8 MG tablet Take 1 tablet (8 mg total) by mouth 2 (two) times daily. The day after chemo for 2 days, then  every 8 hours PRN for N/V 30 tablet 1  . OVER THE COUNTER MEDICATION Take 4 tablets by mouth every morning. Peptids supplements    . OVER THE COUNTER MEDICATION Take 4 tablets by mouth 2 (two) times daily. ([place])    . UNABLE TO FIND C9449 Silicone Breast Prosthesis, Rt quantity 1 L8020  Mastectomy form, Rt, quantity 1 L8000 Mastectomy bra, quantity 6    . UNABLE TO FIND 174.9 Malignant Neoplasm   Mastectomy Right  L8030-Silicone Breast Prosthesis-1  L8000- Mastectomy Bra-2 1 each 0  . Alum & Mag Hydroxide-Simeth (MAGIC MOUTHWASH W/LIDOCAINE) SOLN Take 5 mLs by mouth 4 (four) times daily as needed for mouth pain. 120 mL 0  . oxyCODONE-acetaminophen (PERCOCET/ROXICET) 5-325 MG per tablet 1-2 tabs every 6 hours as needed (Patient not taking: Reported on 08/16/2014) 30 tablet 0  . prochlorperazine (COMPAZINE) 10 MG tablet Take 1 tablet (10 mg total) by mouth every 6 (six) hours as needed (Nausea or vomiting). 30 tablet 1  . traMADol (ULTRAM) 50 MG tablet Take 50 mg by mouth every 6 (six) hours as needed for moderate pain.     No current facility-administered medications for this visit.    PHYSICAL EXAMINATION: ECOG PERFORMANCE STATUS: 1 - Symptomatic but completely ambulatory  Filed Vitals:   08/16/14 0945  BP: 124/55  Pulse: 111  Temp: 98.1 F (36.7 C)  Resp: 18   Filed Weights   08/16/14 0945  Weight: 141 lb 4.8 oz (64.093 kg)    GENERAL:alert, no distress and comfortable SKIN: skin color, texture, turgor are normal, no rashes or significant lesions EYES: normal, Conjunctiva are pink and non-injected, sclera clear OROPHARYNX:no exudate, no erythema and lips, white coating over left buccal mucosa with sores, tongue normal  NECK: supple, thyroid normal size, non-tender, without nodularity LYMPH:  no palpable lymphadenopathy in the cervical, axillary or inguinal LUNGS: clear to auscultation and percussion with normal breathing effort HEART: regular rate & rhythm and no murmurs and no lower extremity edema ABDOMEN:abdomen soft, non-tender and normal bowel sounds Musculoskeletal:no cyanosis of digits and no clubbing  NEURO: alert & oriented x 3 with fluent speech, no focal motor/sensory deficits  LABORATORY DATA:  I have reviewed the data as listed   Chemistry       Component Value Date/Time   NA 141 08/16/2014 0932   NA 139 11/08/2012 0620   K 3.6 08/16/2014 0932   K 4.2 11/08/2012 0620   CL 105 11/08/2012 0620   CL 106 10/23/2012 1459   CO2 22 08/16/2014 0932   CO2 30 11/08/2012 0620   BUN 9.4 08/16/2014 0932   BUN 8 11/08/2012 0620   CREATININE 0.7 08/16/2014 0932   CREATININE 0.74 11/08/2012 0620   CREATININE 0.82 08/18/2011 1056      Component Value Date/Time   CALCIUM 8.1* 08/16/2014 0932   CALCIUM 9.0 11/08/2012 0620   ALKPHOS 544* 08/16/2014 0932   ALKPHOS 96 11/06/2012 0850   AST 128* 08/16/2014 0932   AST 15 11/06/2012 0850   ALT 81* 08/16/2014 0932   ALT 14 11/06/2012 0850   BILITOT 1.06 08/16/2014 0932   BILITOT 0.4 11/06/2012 0850       Lab Results  Component Value Date   WBC 3.8* 08/16/2014   HGB 8.4* 08/16/2014   HCT 26.9* 08/16/2014   MCV 97.5 08/16/2014   PLT 47* 08/16/2014   NEUTROABS 1.2* 08/02/2014    ASSESSMENT & PLAN:  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 Treated with Ibrance plus letrozole plus X [place] since 03/12/2014. PET/CT 07/22/2014 revealed multiple lymph nodes, multiple liver lesions, adrenal lesions, widespread bone metastases, starting Halaven and 08/09/2014  Chemotherapy side effects and toxicites: I have written a prescription for magic mouth wash to manage her mucositis. She will start stool softeners BID and miralax daily for her constipation .  Elevated AST and ALT and alkaline phosphatase: Due to liver involvement. In spite of the abnormal liver tests I recommended proceeding with day 8 of treatment, will continue to monitor these levels.  Thrombocytopenia: Platelets down to 47.  Most likely related to bone marrow involvement by her breast cancer. We have discussed the risks of doing chemotherapy with low platelet count. We need to treat her with systemic chemotherapy to decrease her disease burden. Hence we are treating her with chemotherapy in spite of her low platelet count.  Neutropenia: ANC down to 1000. I discussed with her once again that it is probably related to bone marrow suppression from metastatic breast cancer and that we have to treat her despite for blood counts initially. Will receive neulasta tomorrow via onpro injector to prevent severe neutropenia. Patient advised to call with fever 100.5 or greater.   Anemia: Hemoglobin down to 8.4 today.  If her hemoglobin drops below 7 she might need blood transfusion. Anemia due to chemotherapy or metastatic breast cancer.   Patient will return for labs only next week, and will be back in 2 weeks for a follow up visit and the start of cycle 2 of treatment.    No orders of the defined types were placed in this encounter.   The patient has a good understanding of the overall plan. she agrees with it. She will call with any problems that may develop before her next visit here.   Laurie Panda, NP

## 2014-08-19 ENCOUNTER — Ambulatory Visit: Payer: 59 | Admitting: Hematology and Oncology

## 2014-08-22 ENCOUNTER — Encounter: Payer: 59 | Admitting: Nurse Practitioner

## 2014-08-22 ENCOUNTER — Telehealth: Payer: Self-pay

## 2014-08-22 ENCOUNTER — Telehealth: Payer: Self-pay | Admitting: Nurse Practitioner

## 2014-08-22 ENCOUNTER — Ambulatory Visit (HOSPITAL_COMMUNITY)
Admission: RE | Admit: 2014-08-22 | Discharge: 2014-08-22 | Disposition: A | Discharge status: Home / Self Care | Payer: 59 | Source: Ambulatory Visit | Attending: Hematology and Oncology | Admitting: Hematology and Oncology

## 2014-08-22 ENCOUNTER — Other Ambulatory Visit: Payer: Self-pay | Admitting: Nurse Practitioner

## 2014-08-22 ENCOUNTER — Other Ambulatory Visit: Payer: 59

## 2014-08-22 ENCOUNTER — Other Ambulatory Visit (HOSPITAL_BASED_OUTPATIENT_CLINIC_OR_DEPARTMENT_OTHER): Payer: 59

## 2014-08-22 DIAGNOSIS — C801 Malignant (primary) neoplasm, unspecified: Secondary | ICD-10-CM | POA: Insufficient documentation

## 2014-08-22 DIAGNOSIS — D630 Anemia in neoplastic disease: Secondary | ICD-10-CM | POA: Diagnosis present

## 2014-08-22 DIAGNOSIS — C50111 Malignant neoplasm of central portion of right female breast: Secondary | ICD-10-CM

## 2014-08-22 LAB — MANUAL DIFFERENTIAL
ALC: 6 10*3/uL — ABNORMAL HIGH (ref 0.9–3.3)
ANC (CHCC MAN DIFF): 5.5 10*3/uL (ref 1.5–6.5)
Basophil: 1 % (ref 0–2)
Blasts: 2 % — ABNORMAL HIGH (ref 0–0)
LYMPH: 45 % (ref 14–49)
MONO: 10 % (ref 0–14)
Metamyelocytes: 5 % — ABNORMAL HIGH (ref 0–0)
Myelocytes: 6 % — ABNORMAL HIGH (ref 0–0)
PLT EST: DECREASED
PROMYELO: 1 % — ABNORMAL HIGH (ref 0–0)
SEG: 30 % — ABNORMAL LOW (ref 38–77)
nRBC: 36 % — ABNORMAL HIGH (ref 0–0)

## 2014-08-22 LAB — COMPREHENSIVE METABOLIC PANEL (CC13)
ALT: 52 U/L (ref 0–55)
AST: 108 U/L — ABNORMAL HIGH (ref 5–34)
Albumin: 2.6 g/dL — ABNORMAL LOW (ref 3.5–5.0)
Alkaline Phosphatase: 437 U/L — ABNORMAL HIGH (ref 40–150)
Anion Gap: 12 mEq/L — ABNORMAL HIGH (ref 3–11)
BILIRUBIN TOTAL: 0.82 mg/dL (ref 0.20–1.20)
BUN: 9.1 mg/dL (ref 7.0–26.0)
CALCIUM: 8.2 mg/dL — AB (ref 8.4–10.4)
CO2: 25 meq/L (ref 22–29)
CREATININE: 0.7 mg/dL (ref 0.6–1.1)
Chloride: 102 mEq/L (ref 98–109)
EGFR: >90 mL/min/{1.73_m2} (ref >90)
Glucose: 111 mg/dl (ref 70–140)
Potassium: 4.1 mEq/L (ref 3.5–5.1)
Sodium: 138 mEq/L (ref 136–145)
Total Protein: 6.1 g/dL — ABNORMAL LOW (ref 6.4–8.3)

## 2014-08-22 LAB — CBC WITH DIFFERENTIAL/PLATELET
HCT: 22.9 % — ABNORMAL LOW (ref 34.8–46.6)
HEMOGLOBIN: 7.3 g/dL — AB (ref 11.6–15.9)
MCH: 30.1 pg (ref 25.1–34.0)
MCHC: 31.7 g/dL (ref 31.5–36.0)
MCV: 95.2 fL (ref 79.5–101.0)
Platelets: 21 10*3/uL — ABNORMAL LOW (ref 145–400)
RBC: 2.41 10*6/uL — ABNORMAL LOW (ref 3.70–5.45)
RDW: 21.2 % — AB (ref 11.2–14.5)
WBC: 13.4 10*3/uL — AB (ref 3.9–10.3)

## 2014-08-22 LAB — HOLD TUBE, BLOOD BANK

## 2014-08-22 LAB — PREPARE RBC (CROSSMATCH)

## 2014-08-22 LAB — ABO/RH: ABO/RH(D): B NEG

## 2014-08-22 NOTE — Progress Notes (Signed)
This encounter was created in error - please disregard.

## 2014-08-22 NOTE — Telephone Encounter (Signed)
Pt came in today for lab work, filled out walk-in form stating she was not feeling well and requested to see a Provider. Spoke with pt who states she is having some generalized weakness and legs are a little shaky today. Pt states she does not want to see the Symptom Management NP, would just like to wait to see how her labs were. Pt also requesting assistance from a home health aide, states she is having trouble cooking, grocery shopping and transportation problems. Informed pt of [employer] free program with volunteers, pt agreed to try this first. Form was filled out and faxed to [facility]. Lab results were reviewed by Selena Lesser, NP. HGB 7.3. Informed pt of lab results and pt was sent back to lab for type and cross. Cyndee consulted Dr. Lindi Adie, pt to get blood tomorrow. Appt made for blood product at 1030am. Informed pt who verbalized all understanding and was informed to call with any problems or concerns.

## 2014-08-22 NOTE — Telephone Encounter (Signed)
Pt was not seen by NP/CB per 04/07 POF.....  KJ

## 2014-08-22 NOTE — Telephone Encounter (Signed)
Called in [place] on pt.

## 2014-08-23 ENCOUNTER — Ambulatory Visit (HOSPITAL_BASED_OUTPATIENT_CLINIC_OR_DEPARTMENT_OTHER): Payer: 59

## 2014-08-23 VITALS — BP 105/56 | HR 81 | Temp 98.4°F | Resp 18

## 2014-08-23 DIAGNOSIS — C50111 Malignant neoplasm of central portion of right female breast: Secondary | ICD-10-CM

## 2014-08-23 DIAGNOSIS — D630 Anemia in neoplastic disease: Secondary | ICD-10-CM | POA: Diagnosis not present

## 2014-08-23 DIAGNOSIS — C801 Malignant (primary) neoplasm, unspecified: Secondary | ICD-10-CM | POA: Diagnosis not present

## 2014-08-23 MED ORDER — SODIUM CHLORIDE 0.9 % IV SOLN
250.0000 mL | Freq: Once | INTRAVENOUS | Status: AC
  Administered 2014-08-23: 250 mL via INTRAVENOUS

## 2014-08-23 MED ORDER — HEPARIN SOD (PORK) LOCK FLUSH 100 UNIT/ML IV SOLN
500.0000 [IU] | Freq: Every day | INTRAVENOUS | Status: AC | PRN
  Administered 2014-08-23: 500 [IU]
  Filled 2014-08-23: qty 5

## 2014-08-23 MED ORDER — ACETAMINOPHEN 325 MG PO TABS
650.0000 mg | ORAL_TABLET | Freq: Once | ORAL | Status: AC
  Administered 2014-08-23: 650 mg via ORAL

## 2014-08-23 MED ORDER — DIPHENHYDRAMINE HCL 25 MG PO CAPS
ORAL_CAPSULE | ORAL | Status: AC
  Filled 2014-08-23: qty 1

## 2014-08-23 MED ORDER — DIPHENHYDRAMINE HCL 25 MG PO CAPS
25.0000 mg | ORAL_CAPSULE | Freq: Once | ORAL | Status: AC
  Administered 2014-08-23: 25 mg via ORAL

## 2014-08-23 MED ORDER — ACETAMINOPHEN 325 MG PO TABS
ORAL_TABLET | ORAL | Status: AC
  Filled 2014-08-23: qty 2

## 2014-08-23 MED ORDER — SODIUM CHLORIDE 0.9 % IJ SOLN
10.0000 mL | INTRAMUSCULAR | Status: AC | PRN
  Administered 2014-08-23: 10 mL
  Filled 2014-08-23: qty 10

## 2014-08-23 NOTE — Patient Instructions (Signed)

## 2014-08-24 LAB — TYPE AND SCREEN
ABO/RH(D): B NEG
Antibody Screen: NEGATIVE
Unit division: 0
Unit division: 0

## 2014-08-26 NOTE — Addendum Note (Signed)
Addended by: Oliver Hum on: 08/26/2014 08:30 AM   Modules accepted: Orders

## 2014-08-29 ENCOUNTER — Other Ambulatory Visit: Payer: Self-pay | Admitting: Hematology and Oncology

## 2014-08-30 ENCOUNTER — Other Ambulatory Visit (HOSPITAL_BASED_OUTPATIENT_CLINIC_OR_DEPARTMENT_OTHER): Payer: 59

## 2014-08-30 ENCOUNTER — Encounter: Payer: Self-pay | Admitting: Nurse Practitioner

## 2014-08-30 ENCOUNTER — Ambulatory Visit (HOSPITAL_BASED_OUTPATIENT_CLINIC_OR_DEPARTMENT_OTHER): Payer: 59 | Admitting: Nurse Practitioner

## 2014-08-30 ENCOUNTER — Ambulatory Visit (HOSPITAL_BASED_OUTPATIENT_CLINIC_OR_DEPARTMENT_OTHER): Payer: 59

## 2014-08-30 ENCOUNTER — Other Ambulatory Visit (HOSPITAL_COMMUNITY)
Admission: RE | Admit: 2014-08-30 | Discharge: 2014-08-30 | Disposition: A | Discharge status: Home / Self Care | Payer: 59 | Source: Ambulatory Visit | Attending: Hematology and Oncology | Admitting: Hematology and Oncology

## 2014-08-30 VITALS — BP 125/61 | HR 92 | Temp 98.0°F | Resp 18 | Wt 142.3 lb

## 2014-08-30 DIAGNOSIS — T451X5A Adverse effect of antineoplastic and immunosuppressive drugs, initial encounter: Secondary | ICD-10-CM

## 2014-08-30 DIAGNOSIS — C7951 Secondary malignant neoplasm of bone: Secondary | ICD-10-CM | POA: Diagnosis not present

## 2014-08-30 DIAGNOSIS — C50111 Malignant neoplasm of central portion of right female breast: Secondary | ICD-10-CM

## 2014-08-30 DIAGNOSIS — D649 Anemia, unspecified: Secondary | ICD-10-CM

## 2014-08-30 DIAGNOSIS — D61810 Antineoplastic chemotherapy induced pancytopenia: Secondary | ICD-10-CM

## 2014-08-30 DIAGNOSIS — Z5111 Encounter for antineoplastic chemotherapy: Secondary | ICD-10-CM

## 2014-08-30 DIAGNOSIS — D709 Neutropenia, unspecified: Secondary | ICD-10-CM | POA: Diagnosis not present

## 2014-08-30 DIAGNOSIS — C787 Secondary malignant neoplasm of liver and intrahepatic bile duct: Secondary | ICD-10-CM

## 2014-08-30 DIAGNOSIS — R748 Abnormal levels of other serum enzymes: Secondary | ICD-10-CM

## 2014-08-30 LAB — COMPREHENSIVE METABOLIC PANEL
ALT: 38 U/L — AB (ref 0–35)
AST: 59 U/L — AB (ref 0–37)
Albumin: 3.2 g/dL — ABNORMAL LOW (ref 3.5–5.2)
Alkaline Phosphatase: 414 U/L — ABNORMAL HIGH (ref 39–117)
Anion gap: 7 (ref 5–15)
BUN: 9 mg/dL (ref 6–23)
CALCIUM: 7.3 mg/dL — AB (ref 8.4–10.5)
CO2: 24 mmol/L (ref 19–32)
Chloride: 110 mmol/L (ref 96–112)
Creatinine, Ser: 0.67 mg/dL (ref 0.50–1.10)
GFR calc non Af Amer: >90 mL/min (ref >90)
GLUCOSE: 128 mg/dL — AB (ref 70–99)
Potassium: 4.1 mmol/L (ref 3.5–5.1)
Sodium: 141 mmol/L (ref 135–145)
Total Bilirubin: 0.3 mg/dL (ref 0.3–1.2)
Total Protein: 6.6 g/dL (ref 6.0–8.3)

## 2014-08-30 LAB — CBC WITH DIFFERENTIAL/PLATELET
BASO%: 3.8 % — ABNORMAL HIGH (ref 0.0–2.0)
BASOS ABS: 0.6 10*3/uL — AB (ref 0.0–0.1)
EOS%: 0.1 % (ref 0.0–7.0)
Eosinophils Absolute: 0 10*3/uL (ref 0.0–0.5)
HEMATOCRIT: 32.7 % — AB (ref 34.8–46.6)
HGB: 10.1 g/dL — ABNORMAL LOW (ref 11.6–15.9)
LYMPH%: 10.3 % — AB (ref 14.0–49.7)
MCH: 30.1 pg (ref 25.1–34.0)
MCHC: 30.9 g/dL — ABNORMAL LOW (ref 31.5–36.0)
MCV: 97.6 fL (ref 79.5–101.0)
MONO#: 1.5 10*3/uL — ABNORMAL HIGH (ref 0.1–0.9)
MONO%: 9.6 % (ref 0.0–14.0)
NEUT#: 11.6 10*3/uL — ABNORMAL HIGH (ref 1.5–6.5)
NEUT%: 76.2 % (ref 38.4–76.8)
PLATELETS: 50 10*3/uL — AB (ref 145–400)
RBC: 3.35 10*6/uL — ABNORMAL LOW (ref 3.70–5.45)
RDW: 22.5 % — ABNORMAL HIGH (ref 11.2–14.5)
WBC: 15.2 10*3/uL — AB (ref 3.9–10.3)
lymph#: 1.6 10*3/uL (ref 0.9–3.3)
nRBC: 26 % — ABNORMAL HIGH (ref 0–0)

## 2014-08-30 LAB — HOLD TUBE, BLOOD BANK

## 2014-08-30 MED ORDER — SODIUM CHLORIDE 0.9 % IV SOLN
Freq: Once | INTRAVENOUS | Status: AC
  Administered 2014-08-30: 11:00:00 via INTRAVENOUS

## 2014-08-30 MED ORDER — HEPARIN SOD (PORK) LOCK FLUSH 100 UNIT/ML IV SOLN
500.0000 [IU] | Freq: Once | INTRAVENOUS | Status: AC | PRN
  Administered 2014-08-30: 500 [IU]
  Filled 2014-08-30: qty 5

## 2014-08-30 MED ORDER — SODIUM CHLORIDE 0.9 % IV SOLN
Freq: Once | INTRAVENOUS | Status: AC
  Administered 2014-08-30: 11:00:00 via INTRAVENOUS
  Filled 2014-08-30: qty 4

## 2014-08-30 MED ORDER — SODIUM CHLORIDE 0.9 % IJ SOLN
10.0000 mL | INTRAMUSCULAR | Status: DC | PRN
  Administered 2014-08-30: 10 mL
  Filled 2014-08-30: qty 10

## 2014-08-30 MED ORDER — SODIUM CHLORIDE 0.9 % IV SOLN
1.1500 mg/m2 | Freq: Once | INTRAVENOUS | Status: AC
  Administered 2014-08-30: 2 mg via INTRAVENOUS
  Filled 2014-08-30: qty 4

## 2014-08-30 NOTE — Progress Notes (Signed)
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   11/09/2012 Surgery 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 one out of 13 lymph nodes positive ER 100%, PR 29%, HER-2 negative ratio 0.93 Ki-67 89%   09/26/2013 - 11/08/2013 Radiation Therapy Radiation to right chest wall and right supraclavicular region to a dose of 50.4 gray at 1.8 gray per fraction    Anti-estrogen oral therapy Patient refused antiestrogen therapy and chemotherapy, receives Blue crystal treatment from [place] (patch that sucks out impurities)   02/21/2014 PET scan Diffuse bone metastases involving the axial and appendicular skeleton, biopsy of the left iliac bone positive for metastatic breast cancer   03/12/2014 Treatment Plan Change Ibrance with letrozole and Xgeva    07/22/2014 PET scan New hypermetabolic lymph nodes in the neck and chest, new abnormalities in the liver without underlying CT imaging abnormality, both adrenal glands are hypermetabolic widespread bony metastases as before    CHIEF COMPLIANT:   Halaven Day 1 cycle 2 ( days 1 and 8 every 3 weeks)  INTERVAL HISTORY: [name] is a  59 year old lady with above-mentioned history of metastatic breast cancer  The interval history is remarkable for a blood transfusion last week for a hgb of 7.3. She did have some rectal bleeding, likely  secondary to constipation, straining, and hemorrhoids. Her constipation has resolved and she has seen no more blood after wiping. She denies fevers, chills, nausea, or vomiting. She has some mild tingling for a day after chemotherapy, but it goes away within hours. Her mouth sores have resolved.   REVIEW OF SYSTEMS:   Constitutional: Denies fevers, chills or abnormal weight loss Eyes: Denies blurriness of vision Ears, nose, mouth, throat, and face: Denies  sore throat Respiratory: Denies cough, dyspnea or wheezes Cardiovascular: Denies palpitation, chest discomfort or lower extremity swelling Gastrointestinal:  Denies nausea or heartburn, complains of constipation Skin: Denies abnormal skin rashes Lymphatics: Denies new lymphadenopathy or easy bruising Neurological:Denies numbness, tingling or new weaknesses Behavioral/Psych: Mood is stable, no new changes  All other systems were reviewed with the patient and are negative.  I have reviewed the past medical history, past surgical history, social history and family history with the patient and they are unchanged from previous note.  ALLERGIES:  is allergic to penicillins.  MEDICATIONS:  Current Outpatient Prescriptions  Medication Sig Dispense Refill  . Calcium Carbonate-Vit D-Min (CALCIUM 1200 PO) Take 1 tablet by mouth every morning.    . lidocaine-prilocaine (EMLA) cream Apply to affected area once 30 g 3  . LORazepam (ATIVAN) 0.5 MG tablet TAKE 1 TABLET EVERY 6 HOURS AS NEEDED 30 tablet 0  . Multiple Vitamins-Minerals (MULTIVITAMIN PO) Take 1 tablet by mouth every morning.     Marland Kitchen OVER THE COUNTER MEDICATION Take 4 tablets by mouth every morning. Peptids supplements    . OVER THE COUNTER  MEDICATION Take 4 tablets by mouth 2 (two) times daily. ([place])    . traMADol (ULTRAM) 50 MG tablet Take 50 mg by mouth every 6 (six) hours as needed for moderate pain.    Marland Kitchen UNABLE TO FIND J0093 Silicone Breast Prosthesis, Rt quantity 1 L8020  Mastectomy form, Rt, quantity 1 L8000 Mastectomy bra, quantity 6    . UNABLE TO FIND 174.9 Malignant Neoplasm   Mastectomy Right  L8030-Silicone Breast Prosthesis-1  L8000- Mastectomy Bra-2 1 each 0  . Alum & Mag Hydroxide-Simeth (MAGIC MOUTHWASH W/LIDOCAINE) SOLN Take 5 mLs by mouth 4 (four) times daily as needed for mouth pain. (Patient not taking: Reported on 08/30/2014) 120 mL 0  . ondansetron (ZOFRAN) 8 MG tablet Take 1 tablet (8 mg total) by mouth 2 (two) times daily. The day after chemo for 2 days, then every 8 hours PRN for N/V (Patient not taking: Reported on 08/30/2014) 30 tablet 1  . oxyCODONE-acetaminophen (PERCOCET/ROXICET) 5-325 MG per tablet 1-2 tabs every 6 hours as needed (Patient not taking: Reported on 08/16/2014) 30 tablet 0  . prochlorperazine (COMPAZINE) 10 MG tablet Take 1 tablet (10 mg total) by mouth every 6 (six) hours as needed (Nausea or vomiting). (Patient not taking: Reported on 08/30/2014) 30 tablet 1   No current facility-administered medications for this visit.   Facility-Administered Medications Ordered in Other Visits  Medication Dose Route Frequency Provider Last Rate Last Dose  . eriBULin mesylate (HALAVEN) 2 mg in sodium chloride 0.9 % 100 mL chemo infusion  1.15 mg/m2 (Treatment Plan Actual) Intravenous Once Nicholas Lose, MD      . heparin lock flush 100 unit/mL  500 Units Intracatheter Once PRN Nicholas Lose, MD      . ondansetron (ZOFRAN) 8 mg, dexamethasone (DECADRON) 10 mg in sodium chloride 0.9 % 50 mL IVPB   Intravenous Once Nicholas Lose, MD      . sodium chloride 0.9 % injection 10 mL  10 mL Intracatheter PRN Nicholas Lose, MD        PHYSICAL EXAMINATION: ECOG PERFORMANCE STATUS: 1 - Symptomatic but completely ambulatory  Filed Vitals:   08/30/14 1015  BP: 125/61  Pulse: 92  Temp: 98 F (36.7 C)  Resp: 18   Filed Weights   08/30/14 1015  Weight: 142 lb 4.8 oz (64.547 kg)    GENERAL:alert, no distress and comfortable SKIN: skin color,  texture, turgor are normal, no rashes or significant lesions EYES: normal, Conjunctiva are pink and non-injected, sclera clear OROPHARYNX:no exudate, no erythema and lips, white coating over left buccal mucosa with sores, tongue normal  NECK: supple, thyroid normal size, non-tender, without nodularity LYMPH:  no palpable lymphadenopathy in the cervical, axillary or inguinal LUNGS: clear to auscultation and percussion with normal breathing effort HEART: regular rate & rhythm and no murmurs and no lower extremity edema ABDOMEN:abdomen soft, non-tender and normal bowel sounds Musculoskeletal:no cyanosis of digits and no clubbing  NEURO: alert & oriented x 3 with fluent speech, no focal motor/sensory deficits  LABORATORY DATA:  I have reviewed the data as listed   Chemistry      Component Value Date/Time   NA 141 08/30/2014 0953   NA 138 08/22/2014 1356   K 4.1 08/30/2014 0953   K 4.1 08/22/2014 1356   CL 110 08/30/2014 0953   CL 106 10/23/2012 1459   CO2 24 08/30/2014 0953   CO2 25 08/22/2014 1356   BUN 9 08/30/2014 0953   BUN 9.1 08/22/2014 1356   CREATININE  0.67 08/30/2014 0953   CREATININE 0.7 08/22/2014 1356   CREATININE 0.82 08/18/2011 1056      Component Value Date/Time   CALCIUM 7.3* 08/30/2014 0953   CALCIUM 8.2* 08/22/2014 1356   ALKPHOS 414* 08/30/2014 0953   ALKPHOS 437* 08/22/2014 1356   AST 59* 08/30/2014 0953   AST 108* 08/22/2014 1356   ALT 38* 08/30/2014 0953   ALT 52 08/22/2014 1356   BILITOT 0.3 08/30/2014 0953   BILITOT 0.82 08/22/2014 1356       Lab Results  Component Value Date   WBC 15.2* 08/30/2014   HGB 10.1* 08/30/2014   HCT 32.7* 08/30/2014   MCV 97.6 08/30/2014   PLT 50* 08/30/2014   NEUTROABS 11.6* 08/30/2014    ASSESSMENT & PLAN:  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 Treated with Ibrance plus letrozole plus X [place] since 03/12/2014. PET/CT 07/22/2014 revealed multiple lymph nodes, multiple liver lesions, adrenal lesions, widespread bone metastases, starting Halaven and 08/09/2014  Chemotherapy side effects and toxicites: Continue magic mouth wash PRN to manage her mucositis. Stool softeners BID and miralax daily for her constipation .  Elevated AST and ALT and alkaline phosphatase: Due to liver involvement. Liver panel improving with treatment.   Thrombocytopenia: Platelets up to 50 this week. Most likely related to bone marrow involvement by her breast cancer. We have discussed the risks of doing chemotherapy with low platelet count. We need to treat her with systemic chemotherapy to decrease her disease burden. Hence we are treating her with chemotherapy in spite of her low platelet count.  Neutropenia: ANC up to 11.6 today. neulasta given on day 9.   Anemia: transfused for hgb 7.3 on 08/23/14. Hgb up to 10.1 today.  [place] will proceed with day 1, cycle 2 of eribulin as planned today. She will return in 1 week for labs, a follow up visit, and cycle 8 of treatment.   No orders of the defined types were placed in this encounter.   The patient has a good understanding of the overall plan. she agrees with it. She will call with any problems that may develop before her next visit here.   Laurie Panda, NP

## 2014-08-30 NOTE — Patient Instructions (Signed)
[facility] Discharge Instructions for Patients Receiving Chemotherapy  Today you received the following chemotherapy agents Halaven  To help prevent nausea and vomiting after your treatment, we encourage you to take your nausea medication as needed   If you develop nausea and vomiting that is not controlled by your nausea medication, call the clinic.   BELOW ARE SYMPTOMS THAT SHOULD BE REPORTED IMMEDIATELY:  *FEVER GREATER THAN 100.5 F  *CHILLS WITH OR WITHOUT FEVER  NAUSEA AND VOMITING THAT IS NOT CONTROLLED WITH YOUR NAUSEA MEDICATION  *UNUSUAL SHORTNESS OF BREATH  *UNUSUAL BRUISING OR BLEEDING  TENDERNESS IN MOUTH AND THROAT WITH OR WITHOUT PRESENCE OF ULCERS  *URINARY PROBLEMS  *BOWEL PROBLEMS  UNUSUAL RASH Items with * indicate a potential emergency and should be followed up as soon as possible.  Feel free to call the clinic you have any questions or concerns. The clinic phone number is [phone].  Please show the [place] at check-in to the Emergency Department and triage nurse.

## 2014-08-30 NOTE — Progress Notes (Signed)
Labs reviewed with [facility] NP, okay to tx with today's CMET and platelets 50.

## 2014-09-05 NOTE — Assessment & Plan Note (Signed)
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 Treated with Ibrance plus letrozole plus X [place] since 03/12/2014. PET/CT 07/22/2014 revealed multiple lymph nodes, multiple liver lesions, adrenal lesions, widespread bone metastases, started Halaven 08/09/2014  Treatment plan: Halaven day 1 and day 8 every 3 weeks Dr. 08/09/2014 Current treatment: Halaven cycle 2 day 8  Chemotherapy toxicities: 1. Elevated AST and ALT and alkaline phosphatase: Due to liver involvement. She will need to be followed up closely on her liver function tests. 2. Thrombocytopenia: Most likely related to bone marrow involvement by her breast cancer. Monitoring her closely  3. Neutropenia: ANC 1200 even prior to onset of chemotherapy probably related to bone marrow Involvement from metastatic breast cancer 4. Anemia hemoglobin   Return to clinic in 2 weeks for cycle 3 and follow-up

## 2014-09-06 ENCOUNTER — Ambulatory Visit (HOSPITAL_BASED_OUTPATIENT_CLINIC_OR_DEPARTMENT_OTHER): Payer: 59

## 2014-09-06 ENCOUNTER — Other Ambulatory Visit (HOSPITAL_BASED_OUTPATIENT_CLINIC_OR_DEPARTMENT_OTHER): Payer: 59

## 2014-09-06 ENCOUNTER — Telehealth: Payer: Self-pay | Admitting: Hematology and Oncology

## 2014-09-06 ENCOUNTER — Ambulatory Visit (HOSPITAL_BASED_OUTPATIENT_CLINIC_OR_DEPARTMENT_OTHER): Payer: 59 | Admitting: Hematology and Oncology

## 2014-09-06 VITALS — BP 126/58 | HR 89 | Temp 98.0°F | Resp 18 | Ht 65.0 in | Wt 140.5 lb

## 2014-09-06 DIAGNOSIS — D709 Neutropenia, unspecified: Secondary | ICD-10-CM | POA: Diagnosis not present

## 2014-09-06 DIAGNOSIS — D696 Thrombocytopenia, unspecified: Secondary | ICD-10-CM

## 2014-09-06 DIAGNOSIS — D649 Anemia, unspecified: Secondary | ICD-10-CM

## 2014-09-06 DIAGNOSIS — C50111 Malignant neoplasm of central portion of right female breast: Secondary | ICD-10-CM

## 2014-09-06 DIAGNOSIS — C7951 Secondary malignant neoplasm of bone: Secondary | ICD-10-CM

## 2014-09-06 DIAGNOSIS — Z5111 Encounter for antineoplastic chemotherapy: Secondary | ICD-10-CM

## 2014-09-06 DIAGNOSIS — Z5189 Encounter for other specified aftercare: Secondary | ICD-10-CM | POA: Diagnosis not present

## 2014-09-06 LAB — MANUAL DIFFERENTIAL
ALC: 1.6 10*3/uL (ref 0.9–3.3)
ANC (CHCC manual diff): 3 10*3/uL (ref 1.5–6.5)
BAND NEUTROPHILS: 6 % (ref 0–10)
Basophil: 3 % — ABNORMAL HIGH (ref 0–2)
Blasts: 0 % (ref 0–0)
EOS: 0 % (ref 0–7)
LYMPH: 30 % (ref 14–49)
METAMYELOCYTES PCT: 8 % — AB (ref 0–0)
MONO: 12 % (ref 0–14)
Myelocytes: 3 % — ABNORMAL HIGH (ref 0–0)
OTHER CELL: 0 % (ref 0–0)
PLT EST: DECREASED
PROMYELO: 0 % (ref 0–0)
SEG: 38 % (ref 38–77)
VARIANT LYMPH: 0 % (ref 0–0)
nRBC: 125 % — ABNORMAL HIGH (ref 0–0)

## 2014-09-06 LAB — CBC WITH DIFFERENTIAL/PLATELET
HEMATOCRIT: 27.9 % — AB (ref 34.8–46.6)
HEMOGLOBIN: 8.5 g/dL — AB (ref 11.6–15.9)
MCH: 29.4 pg (ref 25.1–34.0)
MCHC: 30.5 g/dL — ABNORMAL LOW (ref 31.5–36.0)
MCV: 96.5 fL (ref 79.5–101.0)
PLATELETS: 39 10*3/uL — AB (ref 145–400)
RBC: 2.89 10*6/uL — ABNORMAL LOW (ref 3.70–5.45)
RDW: 21.4 % — ABNORMAL HIGH (ref 11.2–14.5)
WBC: 5.4 10*3/uL (ref 3.9–10.3)

## 2014-09-06 LAB — COMPREHENSIVE METABOLIC PANEL (CC13)
ALBUMIN: 3 g/dL — AB (ref 3.5–5.0)
ALT: 34 U/L (ref 0–55)
AST: 45 U/L — ABNORMAL HIGH (ref 5–34)
Alkaline Phosphatase: 405 U/L — ABNORMAL HIGH (ref 40–150)
Anion Gap: 11 mEq/L (ref 3–11)
BUN: 8 mg/dL (ref 7.0–26.0)
CALCIUM: 7.5 mg/dL — AB (ref 8.4–10.4)
CHLORIDE: 111 meq/L — AB (ref 98–109)
CO2: 20 mEq/L — ABNORMAL LOW (ref 22–29)
Creatinine: 0.7 mg/dL (ref 0.6–1.1)
Glucose: 140 mg/dl (ref 70–140)
POTASSIUM: 3.8 meq/L (ref 3.5–5.1)
Sodium: 141 mEq/L (ref 136–145)
Total Bilirubin: 0.56 mg/dL (ref 0.20–1.20)
Total Protein: 6.6 g/dL (ref 6.4–8.3)

## 2014-09-06 MED ORDER — SODIUM CHLORIDE 0.9 % IV SOLN
Freq: Once | INTRAVENOUS | Status: AC
  Administered 2014-09-06: 10:00:00 via INTRAVENOUS
  Filled 2014-09-06: qty 4

## 2014-09-06 MED ORDER — SODIUM CHLORIDE 0.9 % IV SOLN
Freq: Once | INTRAVENOUS | Status: AC
  Administered 2014-09-06: 10:00:00 via INTRAVENOUS

## 2014-09-06 MED ORDER — PEGFILGRASTIM 6 MG/0.6ML ~~LOC~~ PSKT
6.0000 mg | PREFILLED_SYRINGE | Freq: Once | SUBCUTANEOUS | Status: AC
  Administered 2014-09-06: 6 mg via SUBCUTANEOUS
  Filled 2014-09-06: qty 0.6

## 2014-09-06 MED ORDER — HEPARIN SOD (PORK) LOCK FLUSH 100 UNIT/ML IV SOLN
500.0000 [IU] | Freq: Once | INTRAVENOUS | Status: AC | PRN
  Administered 2014-09-06: 500 [IU]
  Filled 2014-09-06: qty 5

## 2014-09-06 MED ORDER — SODIUM CHLORIDE 0.9 % IJ SOLN
10.0000 mL | INTRAMUSCULAR | Status: DC | PRN
  Administered 2014-09-06: 10 mL
  Filled 2014-09-06: qty 10

## 2014-09-06 MED ORDER — ERIBULIN MESYLATE CHEMO INJECTION 1 MG/2ML
1.1500 mg/m2 | Freq: Once | INTRAVENOUS | Status: AC
  Administered 2014-09-06: 2 mg via INTRAVENOUS
  Filled 2014-09-06: qty 4

## 2014-09-06 NOTE — Telephone Encounter (Signed)
per pof to sch pt appt-gave pt copy of sch °

## 2014-09-06 NOTE — Progress Notes (Signed)
Per Dr Lindi Adie, [place] to treat with platelets at 39.

## 2014-09-06 NOTE — Patient Instructions (Signed)
[name] Discharge Instructions for Patients Receiving Chemotherapy  Today you received the following chemotherapy agents Halaven  To help prevent nausea and vomiting after your treatment, we encourage you to take your nausea medication as needed   If you develop nausea and vomiting that is not controlled by your nausea medication, call the clinic.   BELOW ARE SYMPTOMS THAT SHOULD BE REPORTED IMMEDIATELY:  *FEVER GREATER THAN 100.5 F  *CHILLS WITH OR WITHOUT FEVER  NAUSEA AND VOMITING THAT IS NOT CONTROLLED WITH YOUR NAUSEA MEDICATION  *UNUSUAL SHORTNESS OF BREATH  *UNUSUAL BRUISING OR BLEEDING  TENDERNESS IN MOUTH AND THROAT WITH OR WITHOUT PRESENCE OF ULCERS  *URINARY PROBLEMS  *BOWEL PROBLEMS  UNUSUAL RASH Items with * indicate a potential emergency and should be followed up as soon as possible.  Feel free to call the clinic you have any questions or concerns. The clinic phone number is [phone].  Please show the [place] at check-in to the Emergency Department and triage nurse.

## 2014-09-06 NOTE — Progress Notes (Signed)
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   11/09/2012 Surgery 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 one out of 13 lymph nodes positive ER 100%, PR 29%, HER-2 negative ratio 0.93 Ki-67 89%   09/26/2013 - 11/08/2013 Radiation Therapy Radiation to right chest wall and right supraclavicular region to a dose of 50.4 gray at 1.8 gray per fraction    Anti-estrogen oral therapy Patient refused antiestrogen therapy and chemotherapy, receives Blue crystal treatment from [place] (patch that sucks out impurities)   02/21/2014 PET scan Diffuse bone metastases involving the axial and appendicular skeleton, biopsy of the left iliac bone positive for metastatic breast cancer   03/12/2014 Treatment Plan Change Ibrance with letrozole and Xgeva    07/22/2014 PET scan New hypermetabolic lymph nodes in the neck and chest, new abnormalities in the liver without underlying CT imaging abnormality, both adrenal glands are hypermetabolic widespread bony metastases as before    CHIEF COMPLIANT: Cycle 2 day 8 Halaven  INTERVAL HISTORY: [name] is a 59 year old with above-mentioned history of metastatic breast cancer who failed Ibrance with letrozole and is currently on Halaven. She is tolerating this very well with very minimal symptoms of side effects. She took Zofran for several days and  that lead to constipation. Other than that she has no issues to it. She has good energy level is good appetite and good performance status. She noticed that several subcutaneous nodules that she had previously have all disappeared. She believes that there is no point in worrying about it and she needs to to God to help her.  REVIEW OF SYSTEMS:   Constitutional: Denies fevers, chills or abnormal weight loss Eyes: Denies blurriness of vision Ears, nose, mouth, throat, and face: Denies mucositis or sore throat Respiratory: Denies cough, dyspnea or wheezes Cardiovascular: Denies palpitation, chest discomfort or lower extremity swelling Gastrointestinal:  Denies nausea, heartburn or change in bowel habits Skin: Denies abnormal skin rashes Lymphatics: Denies new lymphadenopathy or easy bruising Neurological:Denies numbness, tingling or new weaknesses Behavioral/Psych: Mood is stable, no new changes  All other systems were reviewed with the patient and are negative.  I have reviewed the past medical history, past surgical history, social history and family history with the patient and they are unchanged from previous note.  ALLERGIES:  is allergic to penicillins.  MEDICATIONS:  Current Outpatient Prescriptions  Medication Sig Dispense Refill  . Alum & Mag Hydroxide-Simeth (MAGIC MOUTHWASH W/LIDOCAINE) SOLN Take 5 mLs by mouth 4 (four) times daily as needed for mouth pain. (Patient not taking: Reported on 08/30/2014) 120 mL 0  . Calcium Carbonate-Vit D-Min (CALCIUM 1200 PO) Take 1 tablet by mouth every morning.    . lidocaine-prilocaine (EMLA) cream Apply to affected area once 30 g 3  . LORazepam (ATIVAN) 0.5 MG tablet TAKE 1 TABLET EVERY 6 HOURS AS NEEDED 30 tablet  0  . Multiple Vitamins-Minerals (MULTIVITAMIN PO) Take 1 tablet by mouth every morning.     . ondansetron (ZOFRAN) 8 MG tablet Take 1 tablet (8 mg total) by mouth 2 (two) times daily. The day after chemo for 2 days, then every 8 hours  PRN for N/V (Patient not taking: Reported on 08/30/2014) 30 tablet 1  . OVER THE COUNTER MEDICATION Take 4 tablets by mouth every morning. Peptids supplements    . OVER THE COUNTER MEDICATION Take 4 tablets by mouth 2 (two) times daily. ([place])    . oxyCODONE-acetaminophen (PERCOCET/ROXICET) 5-325 MG per tablet 1-2 tabs every 6 hours as needed (Patient not taking: Reported on 08/16/2014) 30 tablet 0  . prochlorperazine (COMPAZINE) 10 MG tablet Take 1 tablet (10 mg total) by mouth every 6 (six) hours as needed (Nausea or vomiting). (Patient not taking: Reported on 08/30/2014) 30 tablet 1  . traMADol (ULTRAM) 50 MG tablet Take 50 mg by mouth every 6 (six) hours as needed for moderate pain.    Marland Kitchen UNABLE TO FIND [id number] Silicone Breast Prosthesis, Rt quantity 1 L8020 Mastectomy form, Rt, quantity 1 L8000 Mastectomy bra, quantity 6    . UNABLE TO FIND 174.9 Malignant Neoplasm   Mastectomy Right  L8030-Silicone Breast Prosthesis-1  L8000- Mastectomy Bra-2 1 each 0   No current facility-administered medications for this visit.    PHYSICAL EXAMINATION: ECOG PERFORMANCE STATUS: 1 - Symptomatic but completely ambulatory  Filed Vitals:   09/06/14 0918  BP: 126/58  Pulse: 89  Temp: 98 F (36.7 C)  Resp: 18   Filed Weights   09/06/14 0918  Weight: 140 lb 8 oz (63.73 kg)    GENERAL:alert, no distress and comfortable SKIN: skin color, texture, turgor are normal, no rashes or significant lesions EYES: normal, Conjunctiva are pink and non-injected, sclera clear OROPHARYNX:no exudate, no erythema and lips, buccal mucosa, and tongue normal  NECK: supple, thyroid normal size, non-tender, without nodularity LYMPH:  no palpable lymphadenopathy in the cervical, axillary or inguinal LUNGS: clear to auscultation and percussion with normal breathing effort HEART: regular rate & rhythm and no murmurs and no lower extremity edema ABDOMEN:abdomen soft, non-tender and normal bowel  sounds Musculoskeletal:no cyanosis of digits and no clubbing  NEURO: alert & oriented x 3 with fluent speech, no focal motor/sensory deficits  LABORATORY DATA:  I have reviewed the data as listed   Chemistry      Component Value Date/Time   NA 141 08/30/2014 0953   NA 138 08/22/2014 1356   K 4.1 08/30/2014 0953   K 4.1 08/22/2014 1356   CL 110 08/30/2014 0953   CL 106 10/23/2012 1459   CO2 24 08/30/2014 0953   CO2 25 08/22/2014 1356   BUN 9 08/30/2014 0953   BUN 9.1 08/22/2014 1356   CREATININE 0.67 08/30/2014 0953   CREATININE 0.7 08/22/2014 1356   CREATININE 0.82 08/18/2011 1056      Component Value Date/Time   CALCIUM 7.3* 08/30/2014 0953   CALCIUM 8.2* 08/22/2014 1356   ALKPHOS 414* 08/30/2014 0953   ALKPHOS 437* 08/22/2014 1356   AST 59* 08/30/2014 0953   AST 108* 08/22/2014 1356   ALT 38* 08/30/2014 0953   ALT 52 08/22/2014 1356   BILITOT 0.3 08/30/2014 0953   BILITOT 0.82 08/22/2014 1356       Lab Results  Component Value Date   WBC 15.2* 08/30/2014   HGB 10.1* 08/30/2014   HCT 32.7* 08/30/2014   MCV 97.6 08/30/2014   PLT  50* 08/30/2014   NEUTROABS 11.6* 08/30/2014    ASSESSMENT & PLAN:  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 Treated with Ibrance plus letrozole plus X [place] since 03/12/2014. PET/CT 07/22/2014 revealed multiple lymph nodes, multiple liver lesions, adrenal lesions, widespread bone metastases, started Halaven 08/09/2014  Treatment plan: Halaven day 1 and day 8 every 3 weeks Dr. 08/09/2014 Current treatment: Halaven cycle 2 day 8  Chemotherapy toxicities: 1. Elevated AST and ALT and alkaline phosphatase: Due to liver involvement. She  will need to be followed up closely on her liver function tests. 2. Thrombocytopenia: Most likely related to bone marrow involvement by her breast cancer. Monitoring her closely  3. Neutropenia: ANC 1200 even prior to onset of chemotherapy probably related to bone marrow Involvement from metastatic breast cancer 4. Anemia hemoglobin  8.5, previously given blood transfusion.  Clinical response: Patient has noticed a remarkable improvement in her subcutaneous nodules which have all disappeared. Her energy levels have improved tremendously. She is eating well and does not feel ill or sick. I recommended chemotherapy in spite of her platelets being 39.  Plan: 3 months of chemotherapy with the reassessment with a PET/CT scan. Return to clinic in 2 weeks for cycle 3 and follow-up    No orders of the defined types were placed in this encounter.   The patient has a good understanding of the overall plan. she agrees with it. She will call with any problems that may develop before her next visit here.   Rulon Eisenmenger, MD   Findings

## 2014-09-19 NOTE — Assessment & Plan Note (Addendum)
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 Treated with Ibrance plus letrozole plus X [place] since 03/12/2014. PET/CT 07/22/2014 revealed multiple lymph nodes, multiple liver lesions, adrenal lesions, widespread bone metastases, started Halaven 08/09/2014  Treatment plan: Halaven day 1 and day 8 every 3 weeks started 08/09/2014 Current treatment: Halaven cycle 3 day 1  Chemotherapy toxicities: 1. Elevated AST and ALT and alkaline phosphatase: Due to liver involvement. She will need to be followed up closely on her liver function tests. 2. Thrombocytopenia: Most likely related to bone marrow involvement by her breast cancer. Monitoring her closely  3. Neutropenia: ANC 1200 even prior to onset of chemotherapy probably related to bone marrow Involvement from metastatic breast cancer 4. Anemia hemoglobin   Return to clinic in 3 weeks for cycle 4 and we will obtain CT scans prior to that visit  follow-up in 3 weeks

## 2014-09-20 ENCOUNTER — Ambulatory Visit: Payer: 59

## 2014-09-20 ENCOUNTER — Telehealth: Payer: Self-pay | Admitting: Hematology and Oncology

## 2014-09-20 ENCOUNTER — Other Ambulatory Visit (HOSPITAL_BASED_OUTPATIENT_CLINIC_OR_DEPARTMENT_OTHER): Payer: 59

## 2014-09-20 ENCOUNTER — Other Ambulatory Visit: Payer: 59

## 2014-09-20 ENCOUNTER — Ambulatory Visit (HOSPITAL_BASED_OUTPATIENT_CLINIC_OR_DEPARTMENT_OTHER): Payer: 59 | Admitting: Hematology and Oncology

## 2014-09-20 ENCOUNTER — Ambulatory Visit (HOSPITAL_COMMUNITY)
Admission: RE | Admit: 2014-09-20 | Discharge: 2014-09-20 | Disposition: A | Discharge status: Home / Self Care | Payer: 59 | Source: Ambulatory Visit | Attending: Hematology and Oncology | Admitting: Hematology and Oncology

## 2014-09-20 ENCOUNTER — Ambulatory Visit (HOSPITAL_BASED_OUTPATIENT_CLINIC_OR_DEPARTMENT_OTHER): Payer: 59

## 2014-09-20 VITALS — BP 105/58 | HR 80 | Temp 98.3°F | Resp 18

## 2014-09-20 VITALS — BP 114/53 | HR 100 | Temp 98.2°F | Resp 18 | Ht 65.0 in | Wt 141.5 lb

## 2014-09-20 DIAGNOSIS — Z5111 Encounter for antineoplastic chemotherapy: Secondary | ICD-10-CM | POA: Diagnosis not present

## 2014-09-20 DIAGNOSIS — D649 Anemia, unspecified: Secondary | ICD-10-CM

## 2014-09-20 DIAGNOSIS — D630 Anemia in neoplastic disease: Secondary | ICD-10-CM

## 2014-09-20 DIAGNOSIS — D696 Thrombocytopenia, unspecified: Secondary | ICD-10-CM

## 2014-09-20 DIAGNOSIS — C7951 Secondary malignant neoplasm of bone: Secondary | ICD-10-CM | POA: Diagnosis not present

## 2014-09-20 DIAGNOSIS — K769 Liver disease, unspecified: Secondary | ICD-10-CM

## 2014-09-20 DIAGNOSIS — E279 Disorder of adrenal gland, unspecified: Secondary | ICD-10-CM | POA: Diagnosis not present

## 2014-09-20 DIAGNOSIS — C50911 Malignant neoplasm of unspecified site of right female breast: Secondary | ICD-10-CM

## 2014-09-20 DIAGNOSIS — C50111 Malignant neoplasm of central portion of right female breast: Secondary | ICD-10-CM

## 2014-09-20 DIAGNOSIS — C50919 Malignant neoplasm of unspecified site of unspecified female breast: Secondary | ICD-10-CM

## 2014-09-20 DIAGNOSIS — D709 Neutropenia, unspecified: Secondary | ICD-10-CM

## 2014-09-20 DIAGNOSIS — Z95828 Presence of other vascular implants and grafts: Secondary | ICD-10-CM

## 2014-09-20 DIAGNOSIS — R599 Enlarged lymph nodes, unspecified: Secondary | ICD-10-CM

## 2014-09-20 LAB — CBC WITH DIFFERENTIAL/PLATELET
HCT: 22.9 % — ABNORMAL LOW (ref 34.8–46.6)
HGB: 6.8 g/dL — CL (ref 11.6–15.9)
MCH: 29.4 pg (ref 25.1–34.0)
MCHC: 29.7 g/dL — AB (ref 31.5–36.0)
MCV: 99.1 fL (ref 79.5–101.0)
Platelets: 28 10*3/uL — ABNORMAL LOW (ref 145–400)
RBC: 2.31 10*6/uL — ABNORMAL LOW (ref 3.70–5.45)
RDW: 23.5 % — AB (ref 11.2–14.5)
WBC: 18.2 10*3/uL — AB (ref 3.9–10.3)

## 2014-09-20 LAB — COMPREHENSIVE METABOLIC PANEL (CC13)
ALT: 16 U/L (ref 0–55)
AST: 29 U/L (ref 5–34)
Albumin: 3.2 g/dL — ABNORMAL LOW (ref 3.5–5.0)
Alkaline Phosphatase: 403 U/L — ABNORMAL HIGH (ref 40–150)
Anion Gap: 12 mEq/L — ABNORMAL HIGH (ref 3–11)
BILIRUBIN TOTAL: 1.09 mg/dL (ref 0.20–1.20)
BUN: 9.1 mg/dL (ref 7.0–26.0)
CHLORIDE: 109 meq/L (ref 98–109)
CO2: 20 mEq/L — ABNORMAL LOW (ref 22–29)
CREATININE: 0.7 mg/dL (ref 0.6–1.1)
Calcium: 7.5 mg/dL — ABNORMAL LOW (ref 8.4–10.4)
EGFR: >90 mL/min/{1.73_m2} (ref >90)
GLUCOSE: 143 mg/dL — AB (ref 70–140)
Potassium: 3.8 mEq/L (ref 3.5–5.1)
Sodium: 141 mEq/L (ref 136–145)
Total Protein: 6.6 g/dL (ref 6.4–8.3)

## 2014-09-20 LAB — MANUAL DIFFERENTIAL
ALC: 2.2 10*3/uL (ref 0.9–3.3)
ANC (CHCC manual diff): 14.9 10*3/uL — ABNORMAL HIGH (ref 1.5–6.5)
BAND NEUTROPHILS: 20 % — AB (ref 0–10)
BASOPHIL: 0 % (ref 0–2)
BLASTS: 0 % (ref 0–0)
EOS%: 0 % (ref 0–7)
LYMPH: 12 % — AB (ref 14–49)
MONO: 6 % (ref 0–14)
Metamyelocytes: 8 % — ABNORMAL HIGH (ref 0–0)
Myelocytes: 6 % — ABNORMAL HIGH (ref 0–0)
NRBC: 24 % — AB (ref 0–0)
OTHER CELL: 0 % (ref 0–0)
PLT EST: DECREASED
PROMYELO: 0 % (ref 0–0)
SEG: 48 % (ref 38–77)
Variant Lymph: 0 % (ref 0–0)

## 2014-09-20 LAB — PREPARE RBC (CROSSMATCH)

## 2014-09-20 MED ORDER — ACETAMINOPHEN 325 MG PO TABS
ORAL_TABLET | ORAL | Status: AC
  Filled 2014-09-20: qty 2

## 2014-09-20 MED ORDER — SODIUM CHLORIDE 0.9 % IV SOLN: Freq: Once | INTRAVENOUS | Status: AC

## 2014-09-20 MED ORDER — SODIUM CHLORIDE 0.9 % IV SOLN
1.1600 mg/m2 | Freq: Once | INTRAVENOUS | Status: AC
  Administered 2014-09-20: 2 mg via INTRAVENOUS
  Filled 2014-09-20: qty 4

## 2014-09-20 MED ORDER — DIPHENHYDRAMINE HCL 25 MG PO CAPS
ORAL_CAPSULE | ORAL | Status: AC
  Filled 2014-09-20: qty 2

## 2014-09-20 MED ORDER — DEXAMETHASONE SODIUM PHOSPHATE 100 MG/10ML IJ SOLN
Freq: Once | INTRAMUSCULAR | Status: AC
  Administered 2014-09-20: 15:00:00 via INTRAVENOUS
  Filled 2014-09-20: qty 4

## 2014-09-20 MED ORDER — HEPARIN SOD (PORK) LOCK FLUSH 100 UNIT/ML IV SOLN
500.0000 [IU] | Freq: Once | INTRAVENOUS | Status: AC | PRN
  Administered 2014-09-20: 500 [IU]
  Filled 2014-09-20: qty 5

## 2014-09-20 MED ORDER — SODIUM CHLORIDE 0.9 % IJ SOLN
10.0000 mL | INTRAMUSCULAR | Status: DC | PRN
  Administered 2014-09-20: 10 mL via INTRAVENOUS
  Filled 2014-09-20: qty 10

## 2014-09-20 MED ORDER — SODIUM CHLORIDE 0.9 % IV SOLN
250.0000 mL | Freq: Once | INTRAVENOUS | Status: AC
  Administered 2014-09-20: 250 mL via INTRAVENOUS

## 2014-09-20 MED ORDER — DIPHENHYDRAMINE HCL 25 MG PO CAPS
25.0000 mg | ORAL_CAPSULE | Freq: Once | ORAL | Status: AC
  Administered 2014-09-20: 25 mg via ORAL

## 2014-09-20 MED ORDER — HEPARIN SOD (PORK) LOCK FLUSH 100 UNIT/ML IV SOLN
500.0000 [IU] | Freq: Every day | INTRAVENOUS | Status: DC | PRN
  Filled 2014-09-20: qty 5

## 2014-09-20 MED ORDER — DENOSUMAB 120 MG/1.7ML ~~LOC~~ SOLN
120.0000 mg | Freq: Once | SUBCUTANEOUS | Status: AC
  Administered 2014-09-20: 120 mg via SUBCUTANEOUS
  Filled 2014-09-20: qty 1.7

## 2014-09-20 MED ORDER — SODIUM CHLORIDE 0.9 % IJ SOLN
10.0000 mL | INTRAMUSCULAR | Status: DC | PRN
  Filled 2014-09-20: qty 10

## 2014-09-20 MED ORDER — ACETAMINOPHEN 325 MG PO TABS
650.0000 mg | ORAL_TABLET | Freq: Once | ORAL | Status: AC
  Administered 2014-09-20: 650 mg via ORAL

## 2014-09-20 MED ORDER — SODIUM CHLORIDE 0.9 % IJ SOLN
10.0000 mL | INTRAMUSCULAR | Status: AC | PRN
  Administered 2014-09-20: 10 mL
  Filled 2014-09-20: qty 10

## 2014-09-20 MED ORDER — HEPARIN SOD (PORK) LOCK FLUSH 100 UNIT/ML IV SOLN
250.0000 [IU] | INTRAVENOUS | Status: DC | PRN
  Filled 2014-09-20: qty 5

## 2014-09-20 NOTE — Progress Notes (Signed)
OK to treat with lab values of 09/20/14 per Dr. Lindi Adie.

## 2014-09-20 NOTE — Progress Notes (Signed)
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   11/09/2012 Surgery 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 one out of 13 lymph nodes positive ER 100%, PR 29%, HER-2 negative ratio 0.93 Ki-67 89%   09/26/2013 - 11/08/2013 Radiation Therapy Radiation to right chest wall and right supraclavicular region to a dose of 50.4 gray at 1.8 gray per fraction    Anti-estrogen oral therapy Patient refused antiestrogen therapy and chemotherapy, receives Blue crystal treatment from [place] (patch that sucks out impurities)   02/21/2014 PET scan Diffuse bone metastases involving the axial and appendicular skeleton, biopsy of the left iliac bone positive for metastatic breast cancer   03/12/2014 Treatment Plan Change Ibrance with letrozole and Xgeva    07/22/2014 PET scan New hypermetabolic lymph nodes in the neck and chest, new abnormalities in the liver without underlying CT imaging abnormality, both adrenal glands are hypermetabolic widespread bony metastases as before   08/09/2014 -  Chemotherapy Halaven day 1, day 8 every 3 weeks    CHIEF COMPLIANT: Halaven cycle 3 day 1  INTERVAL HISTORY: [name] is a 59-year-old with above-mentioned history of metastatic breast cancer currently on palliative chemotherapy with Halaven. She has been tolerating chemotherapy fairly well except for  mild nausea but she has not had any vomiting. She has been getting Neulasta injections and has been getting bone pain. She would like [place] to withhold the Neulasta from this cycle.  REVIEW OF SYSTEMS:   Constitutional: Denies fevers, chills or abnormal weight loss Eyes: Denies blurriness of vision Ears, nose, mouth, throat, and face: Denies mucositis or sore throat Respiratory: Denies cough, dyspnea or wheezes Cardiovascular: Denies palpitation, chest discomfort or lower extremity swelling Gastrointestinal:  Mild nausea Skin: Denies abnormal skin rashes Lymphatics: Denies new lymphadenopathy or easy bruising Neurological:Denies numbness, tingling or new weaknesses Behavioral/Psych: Mood is stable, no new changes   All other systems were reviewed with the patient and are negative.  I have reviewed the past medical history, past surgical history, social history and family history with the patient and they are unchanged from previous note.  ALLERGIES:  is allergic to penicillins.  MEDICATIONS:  Current Outpatient Prescriptions  Medication Sig Dispense Refill  . Alum & Mag Hydroxide-Simeth (MAGIC MOUTHWASH W/LIDOCAINE) SOLN Take 5 mLs by mouth 4 (four) times daily as needed for mouth pain. 120 mL 0  . Calcium Carbonate-Vit D-Min (CALCIUM 1200 PO) Take 1 tablet by mouth every morning.    . lidocaine-prilocaine (EMLA) cream Apply to affected area once 30 g 3  . LORazepam (ATIVAN) 0.5 MG tablet TAKE 1 TABLET EVERY 6 HOURS AS NEEDED 30 tablet 0  . Multiple Vitamins-Minerals (MULTIVITAMIN PO) Take 1 tablet by mouth every morning.     . ondansetron (ZOFRAN) 8 MG tablet Take 1 tablet (8 mg total) by mouth 2 (two) times daily. The day  after chemo for 2 days, then every 8 hours PRN for N/V 30 tablet 1  . OVER THE COUNTER MEDICATION Take 4 tablets by mouth every morning. Peptids supplements    . OVER THE COUNTER MEDICATION Take 4 tablets by mouth 2 (two) times daily. ([place])    .  oxyCODONE-acetaminophen (PERCOCET/ROXICET) 5-325 MG per tablet 1-2 tabs every 6 hours as needed 30 tablet 0  . prochlorperazine (COMPAZINE) 10 MG tablet Take 1 tablet (10 mg total) by mouth every 6 (six) hours as needed (Nausea or vomiting). 30 tablet 1  . traMADol (ULTRAM) 50 MG tablet Take 50 mg by mouth every 6 (six) hours as needed for moderate pain.    Marland Kitchen UNABLE TO FIND [id number] Silicone Breast Prosthesis, Rt quantity 1 L8020 Mastectomy form, Rt, quantity 1 L8000 Mastectomy bra, quantity 6    . UNABLE TO FIND 174.9 Malignant Neoplasm   Mastectomy Right  L8030-Silicone Breast Prosthesis-1  L8000- Mastectomy Bra-2 1 each 0   No current facility-administered medications for this visit.    PHYSICAL EXAMINATION: ECOG PERFORMANCE STATUS: 1 - Symptomatic but completely ambulatory  Filed Vitals:   09/20/14 0927  BP: 114/53  Pulse: 100  Temp: 98.2 F (36.8 C)  Resp: 18   Filed Weights   09/20/14 0927  Weight: 141 lb 8 oz (64.184 kg)    GENERAL:alert, no distress and comfortable SKIN: skin color, texture, turgor are normal, no rashes or significant lesions EYES: normal, Conjunctiva are pink and non-injected, sclera clear OROPHARYNX:no exudate, no erythema and lips, buccal mucosa, and tongue normal  NECK: supple, thyroid normal size, non-tender, without nodularity LYMPH:  no palpable lymphadenopathy in the cervical, axillary or inguinal LUNGS: clear to auscultation and percussion with normal breathing effort HEART: regular rate & rhythm and no murmurs and no lower extremity edema ABDOMEN:abdomen soft, non-tender and normal bowel sounds Musculoskeletal:no cyanosis of digits and no clubbing  NEURO: alert & oriented x 3 with fluent speech, no focal motor/sensory deficits  LABORATORY DATA:  I have reviewed the data as listed   Chemistry      Component Value Date/Time   NA 141 09/20/2014 0906   NA 141 08/30/2014 0953   K 3.8 09/20/2014 0906   K 4.1 08/30/2014 0953   CL 110  08/30/2014 0953   CL 106 10/23/2012 1459   CO2 20* 09/20/2014 0906   CO2 24 08/30/2014 0953   BUN 9.1 09/20/2014 0906   BUN 9 08/30/2014 0953   CREATININE 0.7 09/20/2014 0906   CREATININE 0.67 08/30/2014 0953   CREATININE 0.82 08/18/2011 1056      Component Value Date/Time   CALCIUM 7.5* 09/20/2014 0906   CALCIUM 7.3* 08/30/2014 0953   ALKPHOS 403* 09/20/2014 0906   ALKPHOS 414* 08/30/2014 0953   AST 29 09/20/2014 0906   AST 59* 08/30/2014 0953   ALT 16 09/20/2014 0906   ALT 38* 08/30/2014 0953   BILITOT 1.09 09/20/2014 0906   BILITOT 0.3 08/30/2014 0953       Lab Results  Component Value Date   WBC 18.2* 09/20/2014   HGB 6.8* 09/20/2014   HCT 22.9* 09/20/2014   MCV 99.1 09/20/2014   PLT 28* 09/20/2014   NEUTROABS 11.6* 08/30/2014   ASSESSMENT & PLAN:  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 Treated with Ibrance plus letrozole plus X Geva since 03/12/2014. PET/CT 07/22/2014 revealed multiple lymph nodes, multiple liver lesions, adrenal lesions, widespread bone metastases, started Halaven 08/09/2014  Treatment plan: Halaven day 1 and day 8 every 3 weeks started 08/09/2014 Current treatment: Halaven cycle 3 day 1  Chemotherapy toxicities: 1. Elevated AST and ALT and alkaline phosphatase: Due to liver involvement. She will need to be followed up closely on her liver function tests. 2. Thrombocytopenia: Most likely related to bone marrow involvement by her breast cancer. Monitoring her closely  3. Neutropenia: This had improved with Neulasta injection but patient would like to withhold the Neulasta and see how she does without it so we will discontinue it with this  cycle 4. Anemia hemoglobin 6.8: 1 unit of packed red cells  Plan: 1. Proceed with today's chemotherapy in spite of low hemoglobin and low platelets  2. Obtain CT chest abdomen pelvis with contrast prior to next cycle  3. bone marrow biopsy to evaluate bone marrow function. I suspect she has bone marrow involvement from breast cancer.    this information will help [place] to subsequent chemotherapies.    Return to clinic in 3 weeks for cycle 4 and we will obtain CT scans prior to that visit  follow-up in 3 weeks  No orders of the defined types were placed in this encounter.   The patient has a good understanding of the overall plan. she agrees with it. she will call with any problems that may develop before the next visit here.   Rulon Eisenmenger, MD

## 2014-09-20 NOTE — Patient Instructions (Signed)
[facility] Discharge Instructions for Patients Receiving Chemotherapy  Today you received the following chemotherapy agents [place].  To help prevent nausea and vomiting after your treatment, we encourage you to take your nausea medication as prescribed.Blood Transfusion Information WHAT IS A BLOOD TRANSFUSION? A transfusion is the replacement of blood or some of its parts. Blood is made up of multiple cells which provide different functions.  Red blood cells carry oxygen and are used for blood loss replacement.  White blood cells fight against infection.  Platelets control bleeding.  Plasma helps clot blood.  Other blood products are available for specialized needs, such as hemophilia or other clotting disorders. BEFORE THE TRANSFUSION  Who gives blood for transfusions?   You may be able to donate blood to be used at a later date on yourself (autologous donation).  Relatives can be asked to donate blood. This is generally not any safer than if you have received blood from a stranger. The same precautions are taken to ensure safety when a relative's blood is donated.  Healthy volunteers who are fully evaluated to make sure their blood is safe. This is blood bank blood. Transfusion therapy is the safest it has ever been in the practice of medicine. Before blood is taken from a donor, a complete history is taken to make sure that person has no history of diseases nor engages in risky social behavior (examples are intravenous drug use or sexual activity with multiple partners). The donor's travel history is screened to minimize risk of transmitting infections, such as malaria. The donated blood is tested for signs of infectious diseases, such as HIV and hepatitis. The blood is then tested to be sure it is compatible with you in order to minimize the chance of a transfusion reaction. If you or a relative donates blood, this is often done in anticipation of surgery and is not  appropriate for emergency situations. It takes many days to process the donated blood. RISKS AND COMPLICATIONS Although transfusion therapy is very safe and saves many lives, the main dangers of transfusion include:   Getting an infectious disease.  Developing a transfusion reaction. This is an allergic reaction to something in the blood you were given. Every precaution is taken to prevent this. The decision to have a blood transfusion has been considered carefully by your caregiver before blood is given. Blood is not given unless the benefits outweigh the risks. AFTER THE TRANSFUSION  Right after receiving a blood transfusion, you will usually feel much better and more energetic. This is especially true if your red blood cells have gotten low (anemic). The transfusion raises the level of the red blood cells which carry oxygen, and this usually causes an energy increase.  The nurse administering the transfusion will monitor you carefully for complications. HOME CARE INSTRUCTIONS  No special instructions are needed after a transfusion. You may find your energy is better. Speak with your caregiver about any limitations on activity for underlying diseases you may have. SEEK MEDICAL CARE IF:   Your condition is not improving after your transfusion.  You develop redness or irritation at the intravenous (IV) site. SEEK IMMEDIATE MEDICAL CARE IF:  Any of the following symptoms occur over the next 12 hours:  Shaking chills.  You have a temperature by mouth above 102 F (38.9 C), not controlled by medicine.  Chest, back, or muscle pain.  People around you feel you are not acting correctly or are confused.  Shortness of breath or difficulty breathing.  Dizziness and fainting.  You get a rash or develop hives.  You have a decrease in urine output.  Your urine turns a dark color or changes to pink, red, or brown. Any of the following symptoms occur over the next 10 days:  You have a  temperature by mouth above 102 F (38.9 C), not controlled by medicine.  Shortness of breath.  Weakness after normal activity.  The white part of the eye turns yellow (jaundice).  You have a decrease in the amount of urine or are urinating less often.  Your urine turns a dark color or changes to pink, red, or brown. Document Released: 04/30/2000 Document Revised: 07/26/2011 Document Reviewed: 12/18/2007 [facility] Patient Information 2015 [place], [place]. This information is not intended to replace advice given to you by your health care provider. Make sure you discuss any questions you have with your health care provider.    If you develop nausea and vomiting that is not controlled by your nausea medication, call the clinic.   BELOW ARE SYMPTOMS THAT SHOULD BE REPORTED IMMEDIATELY:  *FEVER GREATER THAN 100.5 F  *CHILLS WITH OR WITHOUT FEVER  NAUSEA AND VOMITING THAT IS NOT CONTROLLED WITH YOUR NAUSEA MEDICATION  *UNUSUAL SHORTNESS OF BREATH  *UNUSUAL BRUISING OR BLEEDING  TENDERNESS IN MOUTH AND THROAT WITH OR WITHOUT PRESENCE OF ULCERS  *URINARY PROBLEMS  *BOWEL PROBLEMS  UNUSUAL RASH Items with * indicate a potential emergency and should be followed up as soon as possible.  Feel free to call the clinic you have any questions or concerns. The clinic phone number is [phone].  Please show the [place] at check-in to the Emergency Department and triage nurse.

## 2014-09-20 NOTE — Patient Instructions (Signed)

## 2014-09-20 NOTE — Telephone Encounter (Signed)
Appointments made and avs printed for patient °

## 2014-09-21 LAB — TYPE AND SCREEN
ABO/RH(D): B NEG
Antibody Screen: NEGATIVE
Unit division: 0

## 2014-09-26 ENCOUNTER — Telehealth: Payer: Self-pay | Admitting: Hematology and Oncology

## 2014-09-26 ENCOUNTER — Other Ambulatory Visit: Payer: Self-pay | Admitting: *Deleted

## 2014-09-26 NOTE — Telephone Encounter (Signed)
Left message to confirm lab addon 05/13.

## 2014-09-27 ENCOUNTER — Other Ambulatory Visit (HOSPITAL_BASED_OUTPATIENT_CLINIC_OR_DEPARTMENT_OTHER): Payer: 59

## 2014-09-27 ENCOUNTER — Ambulatory Visit (HOSPITAL_BASED_OUTPATIENT_CLINIC_OR_DEPARTMENT_OTHER): Payer: 59

## 2014-09-27 VITALS — BP 122/61 | HR 90 | Temp 97.6°F | Resp 18

## 2014-09-27 DIAGNOSIS — C7951 Secondary malignant neoplasm of bone: Secondary | ICD-10-CM

## 2014-09-27 DIAGNOSIS — C50111 Malignant neoplasm of central portion of right female breast: Secondary | ICD-10-CM

## 2014-09-27 DIAGNOSIS — C50911 Malignant neoplasm of unspecified site of right female breast: Secondary | ICD-10-CM | POA: Diagnosis not present

## 2014-09-27 DIAGNOSIS — Z5111 Encounter for antineoplastic chemotherapy: Secondary | ICD-10-CM

## 2014-09-27 LAB — CBC WITH DIFFERENTIAL/PLATELET
HEMATOCRIT: 24.7 % — AB (ref 34.8–46.6)
HEMOGLOBIN: 7.4 g/dL — AB (ref 11.6–15.9)
MCH: 28.5 pg (ref 25.1–34.0)
MCHC: 30 g/dL — AB (ref 31.5–36.0)
MCV: 95 fL (ref 79.5–101.0)
Platelets: 47 10*3/uL — ABNORMAL LOW (ref 145–400)
RBC: 2.6 10*6/uL — ABNORMAL LOW (ref 3.70–5.45)
RDW: 22.3 % — AB (ref 11.2–14.5)
WBC: 5.9 10*3/uL (ref 3.9–10.3)

## 2014-09-27 LAB — COMPREHENSIVE METABOLIC PANEL (CC13)
ALBUMIN: 3.3 g/dL — AB (ref 3.5–5.0)
ALT: 18 U/L (ref 0–55)
AST: 26 U/L (ref 5–34)
Alkaline Phosphatase: 289 U/L — ABNORMAL HIGH (ref 40–150)
Anion Gap: 9 mEq/L (ref 3–11)
BUN: 8.8 mg/dL (ref 7.0–26.0)
CALCIUM: 7.5 mg/dL — AB (ref 8.4–10.4)
CO2: 21 mEq/L — ABNORMAL LOW (ref 22–29)
CREATININE: 0.7 mg/dL (ref 0.6–1.1)
Chloride: 112 mEq/L — ABNORMAL HIGH (ref 98–109)
EGFR: >90 mL/min/{1.73_m2} (ref >90)
Glucose: 129 mg/dl (ref 70–140)
POTASSIUM: 3.8 meq/L (ref 3.5–5.1)
Sodium: 142 mEq/L (ref 136–145)
Total Bilirubin: 0.67 mg/dL (ref 0.20–1.20)
Total Protein: 6.5 g/dL (ref 6.4–8.3)

## 2014-09-27 LAB — MANUAL DIFFERENTIAL
ALC: 1.2 10*3/uL (ref 0.9–3.3)
ANC (CHCC manual diff): 4.1 10*3/uL (ref 1.5–6.5)
BASOPHIL: 1 % (ref 0–2)
BLASTS: 2 % — AB (ref 0–0)
Band Neutrophils: 6 % (ref 0–10)
EOS: 0 % (ref 0–7)
LYMPH: 21 % (ref 14–49)
MONO: 7 % (ref 0–14)
Metamyelocytes: 3 % — ABNORMAL HIGH (ref 0–0)
Myelocytes: 4 % — ABNORMAL HIGH (ref 0–0)
NRBC: 78 % — AB (ref 0–0)
OTHER CELL: 0 % (ref 0–0)
PLT EST: DECREASED
PROMYELO: 0 % (ref 0–0)
SEG: 56 % (ref 38–77)
Variant Lymph: 0 % (ref 0–0)

## 2014-09-27 MED ORDER — HEPARIN SOD (PORK) LOCK FLUSH 100 UNIT/ML IV SOLN
500.0000 [IU] | Freq: Once | INTRAVENOUS | Status: AC | PRN
  Administered 2014-09-27: 500 [IU]
  Filled 2014-09-27: qty 5

## 2014-09-27 MED ORDER — DEXAMETHASONE SODIUM PHOSPHATE 100 MG/10ML IJ SOLN
Freq: Once | INTRAMUSCULAR | Status: AC
  Administered 2014-09-27: 11:00:00 via INTRAVENOUS
  Filled 2014-09-27: qty 4

## 2014-09-27 MED ORDER — SODIUM CHLORIDE 0.9 % IV SOLN
1.1500 mg/m2 | Freq: Once | INTRAVENOUS | Status: AC
  Administered 2014-09-27: 2 mg via INTRAVENOUS
  Filled 2014-09-27: qty 4

## 2014-09-27 MED ORDER — SODIUM CHLORIDE 0.9 % IV SOLN
Freq: Once | INTRAVENOUS | Status: AC
  Administered 2014-09-27: 11:00:00 via INTRAVENOUS

## 2014-09-27 MED ORDER — SODIUM CHLORIDE 0.9 % IJ SOLN
10.0000 mL | INTRAMUSCULAR | Status: DC | PRN
  Administered 2014-09-27: 10 mL
  Filled 2014-09-27: qty 10

## 2014-09-27 NOTE — Progress Notes (Signed)
OK to treat with today's labs per Dr. Lindi Adie. Platelet count is 47. Proceed with treatment.

## 2014-09-27 NOTE — Patient Instructions (Signed)
[facility] Discharge Instructions for Patients Receiving Chemotherapy  Today you received the following chemotherapy agents [place].  To help prevent nausea and vomiting after your treatment, we encourage you to take your nausea medication as prescribed.   If you develop nausea and vomiting that is not controlled by your nausea medication, call the clinic.   BELOW ARE SYMPTOMS THAT SHOULD BE REPORTED IMMEDIATELY:  *FEVER GREATER THAN 100.5 F  *CHILLS WITH OR WITHOUT FEVER  NAUSEA AND VOMITING THAT IS NOT CONTROLLED WITH YOUR NAUSEA MEDICATION  *UNUSUAL SHORTNESS OF BREATH  *UNUSUAL BRUISING OR BLEEDING  TENDERNESS IN MOUTH AND THROAT WITH OR WITHOUT PRESENCE OF ULCERS  *URINARY PROBLEMS  *BOWEL PROBLEMS  UNUSUAL RASH Items with * indicate a potential emergency and should be followed up as soon as possible.  Feel free to call the clinic you have any questions or concerns. The clinic phone number is [phone].  Please show the [facility] at check-in to the Emergency Department and triage nurse.

## 2014-09-30 ENCOUNTER — Ambulatory Visit (HOSPITAL_COMMUNITY)
Admission: RE | Admit: 2014-09-30 | Discharge: 2014-09-30 | Disposition: A | Discharge status: Home / Self Care | Payer: 59 | Source: Ambulatory Visit | Attending: Hematology and Oncology | Admitting: Hematology and Oncology

## 2014-09-30 ENCOUNTER — Encounter (HOSPITAL_COMMUNITY): Payer: Self-pay

## 2014-09-30 DIAGNOSIS — C7951 Secondary malignant neoplasm of bone: Secondary | ICD-10-CM | POA: Diagnosis not present

## 2014-09-30 DIAGNOSIS — C50919 Malignant neoplasm of unspecified site of unspecified female breast: Secondary | ICD-10-CM | POA: Diagnosis not present

## 2014-09-30 DIAGNOSIS — C50111 Malignant neoplasm of central portion of right female breast: Secondary | ICD-10-CM

## 2014-09-30 MED ORDER — IOHEXOL 300 MG/ML  SOLN
100.0000 mL | Freq: Once | INTRAMUSCULAR | Status: AC | PRN
  Administered 2014-09-30: 100 mL via INTRAVENOUS

## 2014-10-02 ENCOUNTER — Other Ambulatory Visit: Payer: Self-pay | Admitting: Radiology

## 2014-10-03 ENCOUNTER — Other Ambulatory Visit (HOSPITAL_COMMUNITY): Payer: 59

## 2014-10-03 ENCOUNTER — Ambulatory Visit (HOSPITAL_COMMUNITY): Admission: RE | Admit: 2014-10-03 | Payer: 59 | Source: Ambulatory Visit

## 2014-10-03 ENCOUNTER — Other Ambulatory Visit: Payer: Self-pay | Admitting: Radiology

## 2014-10-04 ENCOUNTER — Ambulatory Visit (HOSPITAL_COMMUNITY)
Admission: RE | Admit: 2014-10-04 | Discharge: 2014-10-04 | Disposition: A | Discharge status: Home / Self Care | Payer: 59 | Source: Ambulatory Visit | Attending: Hematology and Oncology | Admitting: Hematology and Oncology

## 2014-10-04 ENCOUNTER — Encounter (HOSPITAL_COMMUNITY): Payer: Self-pay

## 2014-10-04 DIAGNOSIS — C50111 Malignant neoplasm of central portion of right female breast: Secondary | ICD-10-CM

## 2014-10-04 DIAGNOSIS — D61818 Other pancytopenia: Secondary | ICD-10-CM | POA: Insufficient documentation

## 2014-10-04 LAB — CBC
HCT: 24.1 % — ABNORMAL LOW (ref 36.0–46.0)
Hemoglobin: 7.4 g/dL — ABNORMAL LOW (ref 12.0–15.0)
MCH: 29 pg (ref 26.0–34.0)
MCHC: 30.7 g/dL (ref 30.0–36.0)
MCV: 94.5 fL (ref 78.0–100.0)
Platelets: 82 10*3/uL — ABNORMAL LOW (ref 150–400)
RBC: 2.55 MIL/uL — AB (ref 3.87–5.11)
RDW: 22 % — ABNORMAL HIGH (ref 11.5–15.5)
WBC: 3.4 10*3/uL — ABNORMAL LOW (ref 4.0–10.5)

## 2014-10-04 LAB — BONE MARROW EXAM: Bone Marrow Exam: 355

## 2014-10-04 LAB — PROTIME-INR
INR: 1.12 (ref 0.00–1.49)
Prothrombin Time: 14.6 seconds (ref 11.6–15.2)

## 2014-10-04 LAB — APTT: aPTT: 32 seconds (ref 24–37)

## 2014-10-04 MED ORDER — MIDAZOLAM HCL 2 MG/2ML IJ SOLN
INTRAMUSCULAR | Status: AC | PRN
  Administered 2014-10-04 (×4): 1 mg via INTRAVENOUS

## 2014-10-04 MED ORDER — HEPARIN SOD (PORK) LOCK FLUSH 100 UNIT/ML IV SOLN
500.0000 [IU] | Freq: Once | INTRAVENOUS | Status: AC
  Administered 2014-10-04: 500 [IU] via INTRAVENOUS
  Filled 2014-10-04 (×2): qty 5

## 2014-10-04 MED ORDER — FENTANYL CITRATE (PF) 100 MCG/2ML IJ SOLN
INTRAMUSCULAR | Status: AC | PRN
  Administered 2014-10-04 (×2): 25 ug via INTRAVENOUS

## 2014-10-04 MED ORDER — HYDROCODONE-ACETAMINOPHEN 5-325 MG PO TABS
1.0000 | ORAL_TABLET | ORAL | Status: DC | PRN
  Filled 2014-10-04: qty 2

## 2014-10-04 MED ORDER — MIDAZOLAM HCL 2 MG/2ML IJ SOLN
INTRAMUSCULAR | Status: AC
  Filled 2014-10-04: qty 6

## 2014-10-04 MED ORDER — FENTANYL CITRATE (PF) 100 MCG/2ML IJ SOLN
INTRAMUSCULAR | Status: DC | PRN
  Administered 2014-10-04: 25 ug via INTRAVENOUS

## 2014-10-04 MED ORDER — SODIUM CHLORIDE 0.9 % IV SOLN
INTRAVENOUS | Status: DC
  Administered 2014-10-04: 10:00:00 via INTRAVENOUS

## 2014-10-04 MED ORDER — FENTANYL CITRATE (PF) 100 MCG/2ML IJ SOLN
INTRAMUSCULAR | Status: AC
  Filled 2014-10-04: qty 4

## 2014-10-04 NOTE — Procedures (Signed)
CT guided bone marrow biopsy.  No particles identified in aspirations.  2 cores obtained.  No immediate complication.  Minimal blood loss.

## 2014-10-04 NOTE — H&P (Signed)
Chief Complaint: Pancytopenia Breast cancer  Referring Physician(s): Gudena,Vinay  History of Present Illness: [name] is a 59 y.o. female with breast cancer with mets to the bone who is currently undergoing chemotherapy.  She has pancytopenia We are asked to perform a bone marrow biopsy.   Past Medical History  Diagnosis Date  . Ovarian cyst   . Breast calcification, left   . Diverticulitis     when it flares pt. uses Probiotic, also use baking soda & honey everyday   . Breast cancer     "right" (11/07/2012)  . S/P radiation therapy 09/26/13-11/08/13    right breast    Past Surgical History  Procedure Laterality Date  . Mastectomy modified radical Right 11/07/2012  . Vaginal hysterectomy  2012  . Vaginal delivery      x2  . 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 Breast biopsy Right 10/2012  . Mastectomy modified radical Right 11/07/2012    Procedure: MASTECTOMY MODIFIED RADICAL;  Surgeon: Adin Hector, MD;  Location: [facility];  Service: General;  Laterality: Right;  . Simple excision Right 09/03/2013    simple in office excision of mass right axilla    Allergies: Penicillins  Medications: Prior to Admission medications   Medication Sig Start Date End Date Taking? Authorizing Provider  Alum & Mag Hydroxide-Simeth (MAGIC MOUTHWASH W/LIDOCAINE) SOLN Take 5 mLs by mouth 4 (four) times daily as needed for mouth pain. 08/16/14  Yes Laurie Panda, NP  Calcium Carbonate-Vit D-Min (CALCIUM 1200 PO) Take 1 tablet by mouth daily at 12 noon.    Yes Historical Provider, MD  ibuprofen (ADVIL,MOTRIN) 200 MG tablet Take 200 mg by mouth every 4 (four) hours as needed for moderate pain.   Yes Historical Provider, MD  lidocaine-prilocaine (EMLA) cream Apply to affected area once 07/26/14  Yes Nicholas Lose, MD  Multiple Vitamins-Minerals (MULTIVITAMIN PO) Take 1 tablet by mouth daily at 12 noon.    Yes Historical  Provider, MD  OVER THE COUNTER MEDICATION Take 4 tablets by mouth 2 (two) times daily. ([place])   Yes Historical Provider, MD  prochlorperazine (COMPAZINE) 10 MG tablet Take 1 tablet (10 mg total) by mouth every 6 (six) hours as needed (Nausea or vomiting). 07/26/14  Yes Nicholas Lose, MD  LORazepam (ATIVAN) 0.5 MG tablet TAKE 1 TABLET EVERY 6 HOURS AS NEEDED Patient not taking: Reported on 10/01/2014 08/09/14   Nicholas Lose, MD  ondansetron (ZOFRAN) 8 MG tablet Take 1 tablet (8 mg total) by mouth 2 (two) times daily. The day after chemo for 2 days, then every 8 hours PRN for N/V Patient not taking: Reported on 10/01/2014 07/26/14   Nicholas Lose, MD  oxyCODONE-acetaminophen (PERCOCET/ROXICET) 5-325 MG per tablet 1-2 tabs every 6 hours as needed Patient not taking: Reported on 10/01/2014 07/11/14   Chauncey Cruel, MD  traMADol (ULTRAM) 50 MG tablet Take 50 mg by mouth every 6 (six) hours as needed for moderate pain.    Historical Provider, MD  UNABLE TO FIND L7989 Silicone Breast Prosthesis, Rt quantity 1 L8020 Mastectomy form, Rt, quantity 1 L8000 Mastectomy bra, quantity 6    Historical Provider, MD  UNABLE TO FIND 174.9 Malignant Neoplasm   Mastectomy Right  L8030-Silicone Breast Prosthesis-1  L8000- Mastectomy Bra-2 11/01/13   Fanny Skates, MD     Family History  Problem Relation Age of Onset  . Cancer Mother     ovarian  .  Cancer Brother     bone  . Colon cancer Neg Hx     History   Social History  . Marital Status: Single    Spouse Name: N/A  . Number of Children: 2  . Years of Education: N/A   Occupational History  .  [employer]    flexible nurse technician   Social History Main Topics  . Smoking status: Never Smoker   . Smokeless tobacco: Never Used  . Alcohol Use: No  . Drug Use: No  . Sexual Activity:    Partners: Male   Other Topics Concern  . None   Social History Narrative     Review of Systems: A 12 point ROS discussed and pertinent positives  are indicated in the HPI above.  All other systems are negative.  Review of Systems  Constitutional: Positive for activity change and appetite change. Negative for fever and chills.  Respiratory: Negative for cough, chest tightness and shortness of breath.   Cardiovascular: Negative for chest pain.  Gastrointestinal: Negative for abdominal pain.  Musculoskeletal: Positive for arthralgias.  Skin: Negative.   Neurological: Negative.   Hematological: Bruises/bleeds easily.  Psychiatric/Behavioral: Negative.     Vital Signs: BP 133/81 mmHg  Pulse 97  Temp(Src) 98.2 F (36.8 C) (Oral)  Resp 18  Ht $R'5\' 5"'Vd$  (1.651 m)  Wt 141 lb (63.957 kg)  BMI 23.46 kg/m2  SpO2 100%  Physical Exam  Mallampati Score:  MD Evaluation Airway: WNL Heart: WNL Abdomen: WNL Chest/ Lungs: WNL ASA  Classification: 3 Mallampati/Airway Score: Two  Imaging: Ct Chest W Contrast  09/30/2014   CLINICAL DATA:  Bright breast cancer restaging  EXAM: CT CHEST, ABDOMEN, AND PELVIS WITH CONTRAST  TECHNIQUE: Multidetector CT imaging of the chest, abdomen and pelvis was performed following the standard protocol during bolus administration of intravenous contrast.  CONTRAST:  171mL OMNIPAQUE IOHEXOL 300 MG/ML  SOLN  COMPARISON:  07/22/14  FINDINGS: CT CHEST FINDINGS  Mediastinum: Normal heart size. No pericardial effusion. The trachea is patent and appears midline. Normal appearance of the esophagus. There is no mediastinal or hilar adenopathy.  Lungs/Pleura: No pleural effusion. No airspace consolidation. There is no suspicious pulmonary nodule or mass identified.  Musculoskeletal: There is diffuse sclerotic bone metastasis identified. The CT appearance is not significantly changed when compared with PET-CT from 07/22/2014. There is a mild superior endplate deformity involving the T8 vertebra. Age indeterminate.  CT ABDOMEN AND PELVIS FINDINGS  Hepatobiliary: Numerous low-attenuation foci are identified throughout the liver.  These are new when compared with previous abdomen and pelvis CT from 09/18/2013. These correspond to the multiple areas of abnormal hypermetabolism seen on recent PET-CT dated 07/22/2014. On the most recent PET-CT there was no CT correlate, therefore no direct comparison with that study can be performed. Index lesion within the posterior right hepatic lobe measures 8 mm, image 53/series 2. Index lesion within inferior right hepatic lobe measures 8 mm, image 68/series 2. Index lesion within the lateral segment of left lobe measures 7 mm, image 62 of series 2. The gallbladder is normal. No biliary dilatation identified.  Pancreas: Normal appearance of the pancreas.  Spleen: The spleen is enlarged measuring 17 cm in length. No focal splenic lesion identified.  Adrenals/Urinary Tract: Normal appearance of both adrenal glands. The kidneys are both on unremarkable. Urinary bladder is normal.  Stomach/Bowel: The stomach is unremarkable. The visualized small bowel loops are normal. The appendix is visualized and appears normal. Colon is negative.  Vascular/Lymphatic: Calcified atherosclerotic  disease involves the abdominal aorta. No aneurysm. No enlarged retroperitoneal or mesenteric adenopathy. No enlarged pelvic or inguinal lymph nodes.  Reproductive: The uterus appears surgically absent. No adnexal mass identified.  Other: There is no ascites or focal fluid collections within the abdomen or pelvis.  Musculoskeletal: Widespread sclerotic bone metastases again noted, similar to previous exam.  IMPRESSION: 1. No evidence for soft tissue metastasis within the chest. 2. Multi focal low-attenuation foci within the liver are noted which correspond with the previously described FDG avid/PET positive lesions from 07/22/2014. Because there was no CT correlate at that time direct comparison with most recent previous exam cannot be performed. 3. Widespread bone metastasis as before. 4. Aortic atherosclerosis.   Electronically Signed    By: Signa Kell M.D.   On: 09/30/2014 09:22   Ct Abdomen Pelvis W Contrast  09/30/2014   CLINICAL DATA:  Bright breast cancer restaging  EXAM: CT CHEST, ABDOMEN, AND PELVIS WITH CONTRAST  TECHNIQUE: Multidetector CT imaging of the chest, abdomen and pelvis was performed following the standard protocol during bolus administration of intravenous contrast.  CONTRAST:  OMNIPAQUE IOHEXOL 300 MG/ML  SOLN  COMPARISON:  07/22/14  FINDINGS: CT CHEST FINDINGS  Mediastinum: Normal heart size. No pericardial effusion. The trachea is patent and appears midline. Normal appearance of the esophagus. There is no mediastinal or hilar adenopathy.  Lungs/Pleura: No pleural effusion. No airspace consolidation. There is no suspicious pulmonary nodule or mass identified.  Musculoskeletal: There is diffuse sclerotic bone metastasis identified. The CT appearance is not significantly changed when compared with PET-CT from 07/22/2014. There is a mild superior endplate deformity involving the T8 vertebra. Age indeterminate.  CT ABDOMEN AND PELVIS FINDINGS  Hepatobiliary: Numerous low-attenuation foci are identified throughout the liver. These are new when compared with previous abdomen and pelvis CT from 09/18/2013. These correspond to the multiple areas of abnormal hypermetabolism seen on recent PET-CT dated 07/22/2014. On the most recent PET-CT there was no CT correlate, therefore no direct comparison with that study can be performed. Index lesion within the posterior right hepatic lobe measures 8 mm, image 53/series 2. Index lesion within inferior right hepatic lobe measures 8 mm, image 68/series 2. Index lesion within the lateral segment of left lobe measures 7 mm, image 62 of series 2. The gallbladder is normal. No biliary dilatation identified.  Pancreas: Normal appearance of the pancreas.  Spleen: The spleen is enlarged measuring 17 cm in length. No focal splenic lesion identified.  Adrenals/Urinary Tract: Normal appearance  of both adrenal glands. The kidneys are both on unremarkable. Urinary bladder is normal.  Stomach/Bowel: The stomach is unremarkable. The visualized small bowel loops are normal. The appendix is visualized and appears normal. Colon is negative.  Vascular/Lymphatic: Calcified atherosclerotic disease involves the abdominal aorta. No aneurysm. No enlarged retroperitoneal or mesenteric adenopathy. No enlarged pelvic or inguinal lymph nodes.  Reproductive: The uterus appears surgically absent. No adnexal mass identified.  Other: There is no ascites or focal fluid collections within the abdomen or pelvis.  Musculoskeletal: Widespread sclerotic bone metastases again noted, similar to previous exam.  IMPRESSION: 1. No evidence for soft tissue metastasis within the chest. 2. Multi focal low-attenuation foci within the liver are noted which correspond with the previously described FDG avid/PET positive lesions from 07/22/2014. Because there was no CT correlate at that time direct comparison with most recent previous exam cannot be performed. 3. Widespread bone metastasis as before. 4. Aortic atherosclerosis.   Electronically Signed   By: Ladona Ridgel  Clovis Riley M.D.   On: 09/30/2014 09:22    Labs:  CBC:  Recent Labs  09/06/14 0911 09/20/14 0905 09/27/14 0942 10/04/14 0859  WBC 5.4 18.2* 5.9 3.4*  HGB 8.5* 6.8* 7.4* 7.4*  HCT 27.9* 22.9* 24.7* 24.1*  PLT 39* 28* 47* 82*    COAGS:  Recent Labs  03/04/14 0745 08/02/14 1300  INR 1.01 0.94  APTT 30  --     BMP:  Recent Labs  08/30/14 0953 09/06/14 0912 09/20/14 0906 09/27/14 0942  NA 141 141 141 142  K 4.1 3.8 3.8 3.8  CL 110  --   --   --   CO2 24 20* 20* 21*  GLUCOSE 128* 140 143* 129  BUN 9 8.0 9.1 8.8  CALCIUM 7.3* 7.5* 7.5* 7.5*  CREATININE 0.67 0.7 0.7 0.7  GFRNONAA >90  --   --   --   GFRAA >90  --   --   --     LIVER FUNCTION TESTS:  Recent Labs  08/30/14 0953 09/06/14 0912 09/20/14 0906 09/27/14 0942  BILITOT 0.3 0.56 1.09  0.67  AST 59* 45* 29 26  ALT 38* 34 16 18  ALKPHOS 414* 405* 403* 289*  PROT 6.6 6.6 6.6 6.5  ALBUMIN 3.2* 3.0* 3.2* 3.3*    TUMOR MARKERS: No results for input(s): AFPTM, CEA, CA199, CHROMGRNA in the last 8760 hours.  Assessment and Plan: Pancytopenia Breast Cancer=  On Chemotherapy Will proceed with Bone Marrow Biopsy today by Dr. Anselm Pancoast.  Risks and Benefits discussed with the patient including, but not limited to bleeding, infection, damage to adjacent structures or low yield requiring additional tests. All of the patient's questions were answered, patient is agreeable to proceed. Consent signed and in chart.   Thank you for this interesting consult.  I greatly enjoyed meeting [name] and look forward to participating in their care.  SignedNevin Bloodgood 10/04/2014, 10:40 AM   I spent a total of 20 minutes in face to face in clinical consultation, greater than 50% of which was counseling/coordinating care for Bone Marrow Biopsy

## 2014-10-04 NOTE — Discharge Instructions (Signed)
Bone Marrow Aspiration, Bone Marrow Biopsy °Care After °Read the instructions outlined below and refer to this sheet in the next few weeks. These discharge instructions provide you with general information on caring for yourself after you leave the hospital. Your caregiver may also give you specific instructions. While your treatment has been planned according to the most current medical practices available, unavoidable complications occasionally occur. If you have any problems or questions after discharge, call your caregiver. °FINDING OUT THE RESULTS OF YOUR TEST °Not all test results are available during your visit. If your test results are not back during the visit, make an appointment with your caregiver to find out the results. Do not assume everything is normal if you have not heard from your caregiver or the medical facility. It is important for you to follow up on all of your test results.  °HOME CARE INSTRUCTIONS  °You have had sedation and may be sleepy or dizzy. Your thinking may not be as clear as usual. For the next 24 hours: °· Only take over-the-counter or prescription medicines for pain, discomfort, and or fever as directed by your caregiver. °· Do not drink alcohol. °· Do not smoke. °· Do not drive. °· Do not make important legal decisions. °· Do not operate heavy machinery. °· Do not care for small children by yourself. °· Keep your dressing clean and dry. You may replace dressing with a bandage after 24 hours. °· You may take a bath or shower after 24 hours. °· Use an ice pack for 20 minutes every 2 hours while awake for pain as needed. °SEEK MEDICAL CARE IF:  °· There is redness, swelling, or increasing pain at the biopsy site. °· There is pus coming from the biopsy site. °· There is drainage from a biopsy site lasting longer than one day. °· An unexplained oral temperature above 102° F (38.9° C) develops. °SEEK IMMEDIATE MEDICAL CARE IF:  °· You develop a rash. °· You have difficulty  breathing. °· You develop any reaction or side effects to medications given. °Document Released: 11/20/2004 Document Revised: 07/26/2011 Document Reviewed: 04/30/2008 °ExitCare® Patient Information ©2015 [employer]. This information is not intended to replace advice given to you by your health care provider. Make sure you discuss any questions you have with your health care provider. °Conscious Sedation °Sedation is the use of medicines to promote relaxation and relieve discomfort and anxiety. Conscious sedation is a type of sedation. Under conscious sedation you are less alert than normal but are still able to respond to instructions or stimulation. Conscious sedation is used during short medical and dental procedures. It is milder than deep sedation or general anesthesia and allows you to return to your regular activities sooner.  °LET YOUR HEALTH CARE PROVIDER KNOW ABOUT:  °· Any allergies you have. °· All medicines you are taking, including vitamins, herbs, eye drops, creams, and over-the-counter medicines. °· Use of steroids (by mouth or creams). °· Previous problems you or members of your family have had with the use of anesthetics. °· Any blood disorders you have. °· Previous surgeries you have had. °· Medical conditions you have. °· Possibility of pregnancy, if this applies. °· Use of cigarettes, alcohol, or illegal drugs. °RISKS AND COMPLICATIONS °Generally, this is a safe procedure. However, as with any procedure, problems can occur. Possible problems include: °· Oversedation. °· Trouble breathing on your own. You may need to have a breathing tube until you are awake and breathing on your own. °· Allergic reaction   to any of the medicines used for the procedure. °BEFORE THE PROCEDURE °· You may have blood tests done. These tests can help show how well your kidneys and liver are working. They can also show how well your blood clots. °· A physical exam will be done.   °· Only take medicines as directed by  your health care provider. You may need to stop taking medicines (such as blood thinners, aspirin, or nonsteroidal anti-inflammatory drugs) before the procedure.   °· Do not eat or drink at least 6 hours before the procedure or as directed by your health care provider. °· Arrange for a responsible adult, family member, or friend to take you home after the procedure. He or she should stay with you for at least 24 hours after the procedure, until the medicine has worn off. °PROCEDURE  °· An intravenous (IV) catheter will be inserted into one of your veins. Medicine will be able to flow directly into your body through this catheter. You may be given medicine through this tube to help prevent pain and help you relax. °· The medical or dental procedure will be done. °AFTER THE PROCEDURE °· You will stay in a recovery area until the medicine has worn off. Your blood pressure and pulse will be checked.   °·  Depending on the procedure you had, you may be allowed to go home when you can tolerate liquids and your pain is under control. °Document Released: 01/26/2001 Document Revised: 05/08/2013 Document Reviewed: 01/08/2013 °ExitCare® Patient Information ©2015 [employer]. This information is not intended to replace advice given to you by your health care provider. Make sure you discuss any questions you have with your health care provider. ° °

## 2014-10-11 ENCOUNTER — Other Ambulatory Visit (HOSPITAL_BASED_OUTPATIENT_CLINIC_OR_DEPARTMENT_OTHER): Payer: 59

## 2014-10-11 ENCOUNTER — Ambulatory Visit (HOSPITAL_BASED_OUTPATIENT_CLINIC_OR_DEPARTMENT_OTHER): Payer: 59

## 2014-10-11 ENCOUNTER — Telehealth: Payer: Self-pay | Admitting: Hematology and Oncology

## 2014-10-11 ENCOUNTER — Ambulatory Visit (HOSPITAL_BASED_OUTPATIENT_CLINIC_OR_DEPARTMENT_OTHER): Payer: 59 | Admitting: Hematology and Oncology

## 2014-10-11 VITALS — BP 122/56 | HR 94 | Temp 97.9°F | Resp 18 | Ht 65.0 in | Wt 142.5 lb

## 2014-10-11 DIAGNOSIS — C50111 Malignant neoplasm of central portion of right female breast: Secondary | ICD-10-CM | POA: Diagnosis not present

## 2014-10-11 DIAGNOSIS — D702 Other drug-induced agranulocytosis: Secondary | ICD-10-CM

## 2014-10-11 DIAGNOSIS — D696 Thrombocytopenia, unspecified: Secondary | ICD-10-CM | POA: Diagnosis not present

## 2014-10-11 DIAGNOSIS — C50911 Malignant neoplasm of unspecified site of right female breast: Secondary | ICD-10-CM | POA: Diagnosis not present

## 2014-10-11 DIAGNOSIS — C7951 Secondary malignant neoplasm of bone: Secondary | ICD-10-CM | POA: Diagnosis not present

## 2014-10-11 DIAGNOSIS — D6481 Anemia due to antineoplastic chemotherapy: Secondary | ICD-10-CM

## 2014-10-11 DIAGNOSIS — Z5111 Encounter for antineoplastic chemotherapy: Secondary | ICD-10-CM

## 2014-10-11 LAB — MANUAL DIFFERENTIAL
ALC: 0.7 10*3/uL — AB (ref 0.9–3.3)
ANC (CHCC manual diff): 2.2 10*3/uL (ref 1.5–6.5)
Basophil: 2 % (ref 0–2)
Blasts: 1 % — ABNORMAL HIGH (ref 0–0)
EOS: 1 % (ref 0–7)
LYMPH: 21 % (ref 14–49)
METAMYELOCYTES PCT: 4 % — AB (ref 0–0)
MONO: 5 % (ref 0–14)
Myelocytes: 4 % — ABNORMAL HIGH (ref 0–0)
NRBC: 12 % — AB (ref 0–0)
PLT EST: DECREASED
SEG: 62 % (ref 38–77)

## 2014-10-11 LAB — COMPREHENSIVE METABOLIC PANEL (CC13)
ALT: 13 U/L (ref 0–55)
AST: 17 U/L (ref 5–34)
Albumin: 3.3 g/dL — ABNORMAL LOW (ref 3.5–5.0)
Alkaline Phosphatase: 218 U/L — ABNORMAL HIGH (ref 40–150)
Anion Gap: 8 mEq/L (ref 3–11)
BUN: 9.6 mg/dL (ref 7.0–26.0)
CO2: 22 mEq/L (ref 22–29)
Calcium: 7.6 mg/dL — ABNORMAL LOW (ref 8.4–10.4)
Chloride: 111 mEq/L — ABNORMAL HIGH (ref 98–109)
Creatinine: 0.7 mg/dL (ref 0.6–1.1)
EGFR: >90 mL/min/{1.73_m2} (ref >90)
Glucose: 114 mg/dl (ref 70–140)
POTASSIUM: 3.8 meq/L (ref 3.5–5.1)
SODIUM: 142 meq/L (ref 136–145)
TOTAL PROTEIN: 6.6 g/dL (ref 6.4–8.3)
Total Bilirubin: 0.64 mg/dL (ref 0.20–1.20)

## 2014-10-11 LAB — CBC WITH DIFFERENTIAL/PLATELET
HCT: 26.4 % — ABNORMAL LOW (ref 34.8–46.6)
HGB: 7.7 g/dL — ABNORMAL LOW (ref 11.6–15.9)
MCH: 27.9 pg (ref 25.1–34.0)
MCHC: 29.2 g/dL — ABNORMAL LOW (ref 31.5–36.0)
MCV: 95.7 fL (ref 79.5–101.0)
Platelets: 94 10*3/uL — ABNORMAL LOW (ref 145–400)
RBC: 2.76 10*6/uL — AB (ref 3.70–5.45)
RDW: 22.7 % — AB (ref 11.2–14.5)
WBC: 3.1 10*3/uL — ABNORMAL LOW (ref 3.9–10.3)

## 2014-10-11 MED ORDER — HEPARIN SOD (PORK) LOCK FLUSH 100 UNIT/ML IV SOLN
500.0000 [IU] | Freq: Once | INTRAVENOUS | Status: AC | PRN
  Administered 2014-10-11: 500 [IU]
  Filled 2014-10-11: qty 5

## 2014-10-11 MED ORDER — SODIUM CHLORIDE 0.9 % IV SOLN
Freq: Once | INTRAVENOUS | Status: AC
Start: 2014-10-11 — End: 2014-10-11
  Administered 2014-10-11: 10:00:00 via INTRAVENOUS

## 2014-10-11 MED ORDER — SODIUM CHLORIDE 0.9 % IV SOLN
Freq: Once | INTRAVENOUS | Status: AC
  Administered 2014-10-11: 10:00:00 via INTRAVENOUS
  Filled 2014-10-11: qty 4

## 2014-10-11 MED ORDER — SODIUM CHLORIDE 0.9 % IV SOLN
2.0000 mg | Freq: Once | INTRAVENOUS | Status: AC
  Administered 2014-10-11: 2 mg via INTRAVENOUS
  Filled 2014-10-11: qty 4

## 2014-10-11 MED ORDER — SODIUM CHLORIDE 0.9 % IJ SOLN
10.0000 mL | INTRAMUSCULAR | Status: DC | PRN
  Administered 2014-10-11: 10 mL
  Filled 2014-10-11: qty 10

## 2014-10-11 NOTE — Progress Notes (Signed)
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   11/09/2012 Surgery 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 one out of 13 lymph nodes positive ER 100%, PR 29%, HER-2 negative ratio 0.93 Ki-67 89%   09/26/2013 - 11/08/2013 Radiation Therapy Radiation to right chest wall and right supraclavicular region to a dose of 50.4 gray at 1.8 gray per fraction    Anti-estrogen oral therapy Patient refused antiestrogen therapy and chemotherapy, receives Blue crystal treatment from [place] (patch that sucks out impurities)   02/21/2014 PET scan Diffuse bone metastases involving the axial and appendicular skeleton, biopsy of the left iliac bone positive for metastatic breast cancer   03/12/2014 Treatment Plan Change Ibrance with letrozole and Xgeva    07/22/2014 PET scan New hypermetabolic lymph nodes in the neck and chest, new abnormalities in the liver without underlying CT imaging abnormality, both adrenal glands are hypermetabolic widespread bony metastases as before   08/09/2014 -  Chemotherapy Halaven day 1, day 8 every 3 weeks   10/04/2014 Initial Biopsy Bone marrow biopsy showing extensive infiltration of breast cancer in the bone marrow; CT chest abdomen and pelvis after 3 cycles of Halaven: reveals stable bone disease and stable liver lesions    CHIEF COMPLIANT: Cycle 4  Halaven  INTERVAL HISTORY: [name] is a 59 year old with above-mentioned history of metastatic breast cancer currently on palliative chemotherapy with Halaven. She is tolerating it extremely well without any major problems. She had a CT chest abdomen pelvis as well as a bone marrow biopsy and is here to discuss results of both of them. She has a plan to go to [place] for 10 days starting next Friday.  REVIEW OF SYSTEMS:   Constitutional: Denies fevers, chills or abnormal weight loss Eyes: Denies blurriness of vision Ears, nose, mouth, throat, and face: Denies mucositis or sore throat Respiratory: Denies cough, dyspnea or wheezes Cardiovascular: Denies palpitation, chest discomfort or lower extremity swelling Gastrointestinal:  Denies nausea, heartburn or change in bowel habits Skin: Denies abnormal skin rashes Lymphatics: Denies new lymphadenopathy or easy bruising Neurological:Denies numbness, tingling or new weaknesses Behavioral/Psych: Mood is stable, no new changes   All other systems were reviewed with the patient and are negative.  I have reviewed the past medical history, past surgical history, social history and family history with the patient and they are unchanged from previous note.  ALLERGIES:  is allergic to penicillins.  MEDICATIONS:  Current Outpatient Prescriptions  Medication Sig Dispense Refill  . Alum & Mag Hydroxide-Simeth (MAGIC MOUTHWASH W/LIDOCAINE) SOLN Take 5 mLs by mouth 4 (four) times daily as needed for mouth pain. 120 mL 0  . Calcium Carbonate-Vit D-Min (CALCIUM 1200 PO) Take 1 tablet by mouth daily at 12 noon.     Marland Kitchen ibuprofen (ADVIL,MOTRIN) 200 MG tablet Take 200 mg by mouth every 4 (four) hours as  needed for moderate pain.    Marland Kitchen lidocaine-prilocaine (EMLA) cream Apply to affected area once 30 g 3  . LORazepam (ATIVAN) 0.5 MG tablet TAKE 1 TABLET EVERY 6 HOURS AS NEEDED 30 tablet 0  . Multiple Vitamins-Minerals (MULTIVITAMIN PO) Take 1 tablet by  mouth daily at 12 noon.     . ondansetron (ZOFRAN) 8 MG tablet Take 1 tablet (8 mg total) by mouth 2 (two) times daily. The day after chemo for 2 days, then every 8 hours PRN for N/V 30 tablet 1  . OVER THE COUNTER MEDICATION Take 4 tablets by mouth 2 (two) times daily. ([place])    . oxyCODONE-acetaminophen (PERCOCET/ROXICET) 5-325 MG per tablet 1-2 tabs every 6 hours as needed 30 tablet 0  . prochlorperazine (COMPAZINE) 10 MG tablet Take 1 tablet (10 mg total) by mouth every 6 (six) hours as needed (Nausea or vomiting). 30 tablet 1  . traMADol (ULTRAM) 50 MG tablet Take 50 mg by mouth every 6 (six) hours as needed for moderate pain.    Marland Kitchen UNABLE TO FIND I0165 Silicone Breast Prosthesis, Rt quantity 1 L8020 Mastectomy form, Rt, quantity 1 L8000 Mastectomy bra, quantity 6    . UNABLE TO FIND 174.9 Malignant Neoplasm   Mastectomy Right  L8030-Silicone Breast Prosthesis-1  L8000- Mastectomy Bra-2 1 each 0   No current facility-administered medications for this visit.   Facility-Administered Medications Ordered in Other Visits  Medication Dose Route Frequency Provider Last Rate Last Dose  . eriBULin mesylate (HALAVEN) 2 mg in sodium chloride 0.9 % 100 mL chemo infusion  2 mg Intravenous Once Nicholas Lose, MD   2 mg at 10/11/14 1045    PHYSICAL EXAMINATION: ECOG PERFORMANCE STATUS: 1 - Symptomatic but completely ambulatory  Filed Vitals:   10/11/14 0907  BP: 122/56  Pulse: 94  Temp: 97.9 F (36.6 C)  Resp: 18   Filed Weights   10/11/14 0907  Weight: 142 lb 8 oz (64.638 kg)    GENERAL:alert, no distress and comfortable SKIN: skin color, texture, turgor are normal, no rashes or significant lesions EYES: normal, Conjunctiva are pink and non-injected, sclera clear OROPHARYNX:no exudate, no erythema and lips, buccal mucosa, and tongue normal  NECK: supple, thyroid normal size, non-tender, without nodularity LYMPH:  no palpable lymphadenopathy in the cervical, axillary or  inguinal LUNGS: clear to auscultation and percussion with normal breathing effort HEART: regular rate & rhythm and no murmurs and no lower extremity edema ABDOMEN:abdomen soft, non-tender and normal bowel sounds Musculoskeletal:no cyanosis of digits and no clubbing  NEURO: alert & oriented x 3 with fluent speech, no focal motor/sensory deficits  LABORATORY DATA:  I have reviewed the data as listed   Chemistry      Component Value Date/Time   NA 142 10/11/2014 0856   NA 141 08/30/2014 0953   K 3.8 10/11/2014 0856   K 4.1 08/30/2014 0953   CL 110 08/30/2014 0953   CL 106 10/23/2012 1459   CO2 22 10/11/2014 0856   CO2 24 08/30/2014 0953   BUN 9.6 10/11/2014 0856   BUN 9 08/30/2014 0953   CREATININE 0.7 10/11/2014 0856   CREATININE 0.67 08/30/2014 0953   CREATININE 0.82 08/18/2011 1056      Component Value Date/Time   CALCIUM 7.6* 10/11/2014 0856   CALCIUM 7.3* 08/30/2014 0953   ALKPHOS 218* 10/11/2014 0856   ALKPHOS 414* 08/30/2014 0953   AST 17 10/11/2014 0856   AST 59* 08/30/2014 0953   ALT 13 10/11/2014 0856  ALT 38* 08/30/2014 0953   BILITOT 0.64 10/11/2014 0856   BILITOT 0.3 08/30/2014 0953       Lab Results  Component Value Date   WBC 3.1* 10/11/2014   HGB 7.7* 10/11/2014   HCT 26.4* 10/11/2014   MCV 95.7 10/11/2014   PLT 94* 10/11/2014   NEUTROABS 11.6* 08/30/2014    ASSESSMENT & PLAN:  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 Treated with Ibrance plus letrozole plus X Geva since 03/12/2014. PET/CT 07/22/2014 revealed multiple lymph nodes, multiple liver lesions, adrenal lesions, widespread bone metastases, started Halaven 08/09/2014  Treatment plan:  Halaven day 1 and day 8 every 3 weeks started 08/09/2014 Current treatment: Halaven cycle 3 day 1  Chemotherapy toxicities: 1. Elevated AST and ALT and alkaline phosphatase: Due to liver involvement. She will need to be followed up closely on her liver function tests. 2. Thrombocytopenia: Most likely related to bone marrow involvement by her breast cancer. Monitoring her closely  3. Neutropenia: This had improved with Neulasta injection but patient would like to withhold the Neulasta and see how she does without it so we will discontinue it with this cycle 4. Anemia hemoglobin 7.7 : Stable and the patient is asymptomatic  Plan: 1. Proceed with today's chemotherapy in spite of low hemoglobin and low platelets  2. CT scans after 3 cycles of Halaven shows stable disease in the bones and liver; there is improvement in alkaline phosphatase from 400-200 and improvement in platelet count all the way up to 94,000 today. All of this suggests excellent response to chemotherapy. 3. bone marrow biopsy 10/04/2014: Showed extensive bone marrow involvement by breast cancer. This is a reason why we elected to push through chemotherapy in spite of low counts.   Patient last worked in [place] for 10 days after next week's chemotherapy. This is for a graduation ceremony.  Return to clinic in 3 weeks for cycle 5    No orders of the defined types were placed in this encounter.   The patient has a good understanding of the overall plan. she agrees with it. she will call with any problems that may develop before the next visit here.   Rulon Eisenmenger, MD

## 2014-10-11 NOTE — Telephone Encounter (Signed)
Appointments made and avs printed for patient °

## 2014-10-11 NOTE — Progress Notes (Signed)
CBC reviewed with Dr. Lindi Adie: okay to tx despite labs

## 2014-10-11 NOTE — Patient Instructions (Signed)
[facility] Discharge Instructions for Patients Receiving Chemotherapy  Today you received the following chemotherapy agents: Halaven  To help prevent nausea and vomiting after your treatment, we encourage you to take your nausea medication as prescribed by your physician.   If you develop nausea and vomiting that is not controlled by your nausea medication, call the clinic.   BELOW ARE SYMPTOMS THAT SHOULD BE REPORTED IMMEDIATELY:  *FEVER GREATER THAN 100.5 F  *CHILLS WITH OR WITHOUT FEVER  NAUSEA AND VOMITING THAT IS NOT CONTROLLED WITH YOUR NAUSEA MEDICATION  *UNUSUAL SHORTNESS OF BREATH  *UNUSUAL BRUISING OR BLEEDING  TENDERNESS IN MOUTH AND THROAT WITH OR WITHOUT PRESENCE OF ULCERS  *URINARY PROBLEMS  *BOWEL PROBLEMS  UNUSUAL RASH Items with * indicate a potential emergency and should be followed up as soon as possible.  Feel free to call the clinic you have any questions or concerns. The clinic phone number is [phone].  Please show the [facility] at check-in to the Emergency Department and triage nurse.

## 2014-10-11 NOTE — Assessment & Plan Note (Signed)
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 Treated with Ibrance plus letrozole plus X Geva since 03/12/2014. PET/CT 07/22/2014 revealed multiple lymph nodes, multiple liver lesions, adrenal lesions, widespread bone metastases, started Halaven 08/09/2014  Treatment plan: Halaven day 1 and day 8 every 3 weeks started 08/09/2014 Current treatment: Halaven cycle 3 day 1  Chemotherapy toxicities: 1. Elevated AST and ALT and alkaline phosphatase: Due to liver involvement. She will need to be followed up closely on her liver function tests. 2. Thrombocytopenia: Most likely related to bone marrow involvement by her breast cancer. Monitoring her closely  3. Neutropenia: This had improved with Neulasta injection but patient would like to withhold the Neulasta and see how she does without it so we will discontinue it with this cycle 4. Anemia hemoglobin 7.7 : Stable and the patient is asymptomatic  Plan: 1. Proceed with today's chemotherapy in spite of low hemoglobin and low platelets  2. CT scans after 3 cycles of Halaven shows stable disease in the bones and liver; there is improvement in alkaline phosphatase from 400-200 and improvement in platelet count all the way up to 94,000 today. All of this suggests excellent response to chemotherapy. 3. bone marrow biopsy 10/04/2014: Showed extensive bone marrow involvement by breast cancer. This is a reason why we elected to push through chemotherapy in spite of low counts.   Patient last worked in [place] for 10 days after next week's chemotherapy. This is for a graduation ceremony.  Return to clinic in 3 weeks for cycle 5

## 2014-10-15 LAB — CHROMOSOME ANALYSIS, BONE MARROW

## 2014-10-18 ENCOUNTER — Ambulatory Visit (HOSPITAL_BASED_OUTPATIENT_CLINIC_OR_DEPARTMENT_OTHER): Payer: 59

## 2014-10-18 ENCOUNTER — Encounter: Payer: Self-pay | Admitting: *Deleted

## 2014-10-18 ENCOUNTER — Other Ambulatory Visit (HOSPITAL_BASED_OUTPATIENT_CLINIC_OR_DEPARTMENT_OTHER): Payer: 59

## 2014-10-18 VITALS — BP 112/60 | HR 88 | Temp 98.1°F

## 2014-10-18 DIAGNOSIS — Z5111 Encounter for antineoplastic chemotherapy: Secondary | ICD-10-CM

## 2014-10-18 DIAGNOSIS — C50111 Malignant neoplasm of central portion of right female breast: Secondary | ICD-10-CM | POA: Diagnosis not present

## 2014-10-18 LAB — CBC WITH DIFFERENTIAL/PLATELET
BASO%: 0.9 % (ref 0.0–2.0)
Basophils Absolute: 0 10*3/uL (ref 0.0–0.1)
EOS%: 0.9 % (ref 0.0–7.0)
Eosinophils Absolute: 0 10*3/uL (ref 0.0–0.5)
HCT: 27.6 % — ABNORMAL LOW (ref 34.8–46.6)
HGB: 8.2 g/dL — ABNORMAL LOW (ref 11.6–15.9)
LYMPH%: 25.9 % (ref 14.0–49.7)
MCH: 28 pg (ref 25.1–34.0)
MCHC: 29.7 g/dL — ABNORMAL LOW (ref 31.5–36.0)
MCV: 94.2 fL (ref 79.5–101.0)
MONO#: 0.3 10*3/uL (ref 0.1–0.9)
MONO%: 7.9 % (ref 0.0–14.0)
NEUT#: 2.1 10*3/uL (ref 1.5–6.5)
NEUT%: 64.4 % (ref 38.4–76.8)
Platelets: 138 10*3/uL — ABNORMAL LOW (ref 145–400)
RBC: 2.93 10*6/uL — ABNORMAL LOW (ref 3.70–5.45)
RDW: 22 % — ABNORMAL HIGH (ref 11.2–14.5)
WBC: 3.3 10*3/uL — ABNORMAL LOW (ref 3.9–10.3)
lymph#: 0.9 10*3/uL (ref 0.9–3.3)

## 2014-10-18 LAB — COMPREHENSIVE METABOLIC PANEL (CC13)
ALT: 18 U/L (ref 0–55)
AST: 24 U/L (ref 5–34)
Albumin: 3.6 g/dL (ref 3.5–5.0)
Alkaline Phosphatase: 202 U/L — ABNORMAL HIGH (ref 40–150)
Anion Gap: 7 mEq/L (ref 3–11)
BUN: 8.2 mg/dL (ref 7.0–26.0)
CO2: 22 mEq/L (ref 22–29)
Calcium: 7.2 mg/dL — ABNORMAL LOW (ref 8.4–10.4)
Chloride: 113 mEq/L — ABNORMAL HIGH (ref 98–109)
Creatinine: 0.7 mg/dL (ref 0.6–1.1)
EGFR: >90 mL/min/{1.73_m2} (ref >90)
Glucose: 108 mg/dl (ref 70–140)
Potassium: 3.7 mEq/L (ref 3.5–5.1)
Sodium: 143 mEq/L (ref 136–145)
Total Bilirubin: 0.74 mg/dL (ref 0.20–1.20)
Total Protein: 6.8 g/dL (ref 6.4–8.3)

## 2014-10-18 MED ORDER — SODIUM CHLORIDE 0.9 % IJ SOLN
10.0000 mL | INTRAMUSCULAR | Status: DC | PRN
  Administered 2014-10-18: 10 mL
  Filled 2014-10-18: qty 10

## 2014-10-18 MED ORDER — DEXAMETHASONE SODIUM PHOSPHATE 100 MG/10ML IJ SOLN
Freq: Once | INTRAMUSCULAR | Status: AC
  Administered 2014-10-18: 10:00:00 via INTRAVENOUS
  Filled 2014-10-18: qty 4

## 2014-10-18 MED ORDER — HEPARIN SOD (PORK) LOCK FLUSH 100 UNIT/ML IV SOLN
500.0000 [IU] | Freq: Once | INTRAVENOUS | Status: AC | PRN
  Administered 2014-10-18: 500 [IU]
  Filled 2014-10-18: qty 5

## 2014-10-18 MED ORDER — SODIUM CHLORIDE 0.9 % IV SOLN
Freq: Once | INTRAVENOUS | Status: AC
  Administered 2014-10-18: 09:00:00 via INTRAVENOUS

## 2014-10-18 MED ORDER — SODIUM CHLORIDE 0.9 % IV SOLN
1.1500 mg/m2 | Freq: Once | INTRAVENOUS | Status: AC
  Administered 2014-10-18: 2 mg via INTRAVENOUS
  Filled 2014-10-18: qty 4

## 2014-10-18 NOTE — Progress Notes (Signed)
Received results from [facility] cytogenetic lab, sent to scan.

## 2014-10-18 NOTE — Patient Instructions (Signed)
[facility] Discharge Instructions for Patients Receiving Chemotherapy  Today you received the following chemotherapy agents Halaven.  To help prevent nausea and vomiting after your treatment, we encourage you to take your nausea medication as prescribed.   If you develop nausea and vomiting that is not controlled by your nausea medication, call the clinic.   BELOW ARE SYMPTOMS THAT SHOULD BE REPORTED IMMEDIATELY:  *FEVER GREATER THAN 100.5 F  *CHILLS WITH OR WITHOUT FEVER  NAUSEA AND VOMITING THAT IS NOT CONTROLLED WITH YOUR NAUSEA MEDICATION  *UNUSUAL SHORTNESS OF BREATH  *UNUSUAL BRUISING OR BLEEDING  TENDERNESS IN MOUTH AND THROAT WITH OR WITHOUT PRESENCE OF ULCERS  *URINARY PROBLEMS  *BOWEL PROBLEMS  UNUSUAL RASH Items with * indicate a potential emergency and should be followed up as soon as possible.  Feel free to call the clinic you have any questions or concerns. The clinic phone number is [phone].  Please show the CHEMO ALERT CARD at check-in to the Emergency Department and triage nurse.   

## 2014-10-21 ENCOUNTER — Encounter (HOSPITAL_COMMUNITY): Payer: Self-pay

## 2014-11-01 ENCOUNTER — Encounter: Payer: Self-pay | Admitting: Nurse Practitioner

## 2014-11-01 ENCOUNTER — Ambulatory Visit (HOSPITAL_BASED_OUTPATIENT_CLINIC_OR_DEPARTMENT_OTHER): Payer: 59 | Admitting: Nurse Practitioner

## 2014-11-01 ENCOUNTER — Other Ambulatory Visit (HOSPITAL_BASED_OUTPATIENT_CLINIC_OR_DEPARTMENT_OTHER): Payer: 59

## 2014-11-01 ENCOUNTER — Ambulatory Visit (HOSPITAL_BASED_OUTPATIENT_CLINIC_OR_DEPARTMENT_OTHER): Payer: 59

## 2014-11-01 VITALS — BP 112/56 | HR 87 | Temp 98.6°F | Resp 18 | Ht 65.0 in | Wt 145.9 lb

## 2014-11-01 DIAGNOSIS — D649 Anemia, unspecified: Secondary | ICD-10-CM

## 2014-11-01 DIAGNOSIS — C7951 Secondary malignant neoplasm of bone: Secondary | ICD-10-CM

## 2014-11-01 DIAGNOSIS — D709 Neutropenia, unspecified: Secondary | ICD-10-CM | POA: Diagnosis not present

## 2014-11-01 DIAGNOSIS — D696 Thrombocytopenia, unspecified: Secondary | ICD-10-CM | POA: Diagnosis not present

## 2014-11-01 DIAGNOSIS — C50111 Malignant neoplasm of central portion of right female breast: Secondary | ICD-10-CM

## 2014-11-01 DIAGNOSIS — Z5111 Encounter for antineoplastic chemotherapy: Secondary | ICD-10-CM | POA: Diagnosis not present

## 2014-11-01 LAB — CBC WITH DIFFERENTIAL/PLATELET
BASO%: 0.9 % (ref 0.0–2.0)
BASOS ABS: 0 10*3/uL (ref 0.0–0.1)
EOS%: 0.6 % (ref 0.0–7.0)
Eosinophils Absolute: 0 10*3/uL (ref 0.0–0.5)
HCT: 29.3 % — ABNORMAL LOW (ref 34.8–46.6)
HGB: 9.2 g/dL — ABNORMAL LOW (ref 11.6–15.9)
LYMPH#: 0.8 10*3/uL — AB (ref 0.9–3.3)
LYMPH%: 26.9 % (ref 14.0–49.7)
MCH: 28.4 pg (ref 25.1–34.0)
MCHC: 31.5 g/dL (ref 31.5–36.0)
MCV: 90.2 fL (ref 79.5–101.0)
MONO#: 0.4 10*3/uL (ref 0.1–0.9)
MONO%: 14 % (ref 0.0–14.0)
NEUT#: 1.7 10*3/uL (ref 1.5–6.5)
NEUT%: 57.6 % (ref 38.4–76.8)
Platelets: 180 10*3/uL (ref 145–400)
RBC: 3.25 10*6/uL — AB (ref 3.70–5.45)
RDW: 22.2 % — ABNORMAL HIGH (ref 11.2–14.5)
WBC: 2.9 10*3/uL — ABNORMAL LOW (ref 3.9–10.3)

## 2014-11-01 LAB — COMPREHENSIVE METABOLIC PANEL (CC13)
ALT: 19 U/L (ref 0–55)
ANION GAP: 5 meq/L (ref 3–11)
AST: 25 U/L (ref 5–34)
Albumin: 3.5 g/dL (ref 3.5–5.0)
Alkaline Phosphatase: 142 U/L (ref 40–150)
BILIRUBIN TOTAL: 0.54 mg/dL (ref 0.20–1.20)
BUN: 9.4 mg/dL (ref 7.0–26.0)
CALCIUM: 7.7 mg/dL — AB (ref 8.4–10.4)
CHLORIDE: 113 meq/L — AB (ref 98–109)
CO2: 25 meq/L (ref 22–29)
Creatinine: 0.7 mg/dL (ref 0.6–1.1)
GLUCOSE: 92 mg/dL (ref 70–140)
Potassium: 4.3 mEq/L (ref 3.5–5.1)
Sodium: 143 mEq/L (ref 136–145)
Total Protein: 6.4 g/dL (ref 6.4–8.3)

## 2014-11-01 MED ORDER — SODIUM CHLORIDE 0.9 % IJ SOLN
10.0000 mL | INTRAMUSCULAR | Status: DC | PRN
  Filled 2014-11-01: qty 10

## 2014-11-01 MED ORDER — SODIUM CHLORIDE 0.9 % IJ SOLN
10.0000 mL | INTRAMUSCULAR | Status: DC | PRN
  Administered 2014-11-01: 10 mL via INTRAVENOUS
  Filled 2014-11-01: qty 10

## 2014-11-01 MED ORDER — HEPARIN SOD (PORK) LOCK FLUSH 100 UNIT/ML IV SOLN
500.0000 [IU] | Freq: Once | INTRAVENOUS | Status: AC
  Administered 2014-11-01: 500 [IU] via INTRAVENOUS
  Filled 2014-11-01: qty 5

## 2014-11-01 MED ORDER — SODIUM CHLORIDE 0.9 % IV SOLN
Freq: Once | INTRAVENOUS | Status: AC
  Administered 2014-11-01: 15:00:00 via INTRAVENOUS
  Filled 2014-11-01: qty 4

## 2014-11-01 MED ORDER — SODIUM CHLORIDE 0.9 % IV SOLN
Freq: Once | INTRAVENOUS | Status: AC
  Administered 2014-11-01: 14:00:00 via INTRAVENOUS

## 2014-11-01 MED ORDER — HEPARIN SOD (PORK) LOCK FLUSH 100 UNIT/ML IV SOLN
500.0000 [IU] | Freq: Once | INTRAVENOUS | Status: DC | PRN
  Filled 2014-11-01: qty 5

## 2014-11-01 MED ORDER — SODIUM CHLORIDE 0.9 % IV SOLN
1.1500 mg/m2 | Freq: Once | INTRAVENOUS | Status: AC
  Administered 2014-11-01: 2 mg via INTRAVENOUS
  Filled 2014-11-01: qty 4

## 2014-11-01 NOTE — Patient Instructions (Signed)
[facility] Discharge Instructions for Patients Receiving Chemotherapy  Today you received the following chemotherapy agents Halaven.  To help prevent nausea and vomiting after your treatment, we encourage you to take your nausea medication as prescribed.   If you develop nausea and vomiting that is not controlled by your nausea medication, call the clinic.   BELOW ARE SYMPTOMS THAT SHOULD BE REPORTED IMMEDIATELY:  *FEVER GREATER THAN 100.5 F  *CHILLS WITH OR WITHOUT FEVER  NAUSEA AND VOMITING THAT IS NOT CONTROLLED WITH YOUR NAUSEA MEDICATION  *UNUSUAL SHORTNESS OF BREATH  *UNUSUAL BRUISING OR BLEEDING  TENDERNESS IN MOUTH AND THROAT WITH OR WITHOUT PRESENCE OF ULCERS  *URINARY PROBLEMS  *BOWEL PROBLEMS  UNUSUAL RASH Items with * indicate a potential emergency and should be followed up as soon as possible.  Feel free to call the clinic you have any questions or concerns. The clinic phone number is [phone].  Please show the CHEMO ALERT CARD at check-in to the Emergency Department and triage nurse.   

## 2014-11-01 NOTE — Assessment & Plan Note (Signed)
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 Treated with Ibrance plus letrozole plus X Geva since 03/12/2014. PET/CT 07/22/2014 revealed multiple lymph nodes, multiple liver lesions, adrenal lesions, widespread bone metastases, started Halaven 08/09/2014  Treatment plan: Halaven day 1 and day 8 every 3 weeks started 08/09/2014 Current treatment: Halaven cycle 5 day 1  Chemotherapy toxicities: 1. Elevated AST and ALT and alkaline phosphatase: Due to liver involvement. She will need to be followed up closely on her liver function tests. All normal today 2. Thrombocytopenia: Most likely related to bone marrow involvement by her breast cancer. Count improved to 180 today.  3. Neutropenia: This had improved with Neulasta injection but patient would like to withhold the Neulasta and see how she does without it so we will discontinue it with this cycle 4. Anemia: hemoglobin up to 9.2  Plan: Continue to closely monitor counts Return to clinic in 3 weeks for cycle 6

## 2014-11-01 NOTE — Progress Notes (Signed)
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   11/09/2012 Surgery 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 one out of 13 lymph nodes positive ER 100%, PR 29%, HER-2 negative ratio 0.93 Ki-67 89%   09/26/2013 - 11/08/2013 Radiation Therapy Radiation to right chest wall and right supraclavicular region to a dose of 50.4 gray at 1.8 gray per fraction    Anti-estrogen oral therapy Patient refused antiestrogen therapy and chemotherapy, receives Blue crystal treatment from [place] (patch that sucks out impurities)   02/21/2014 PET scan Diffuse bone metastases involving the axial and appendicular skeleton, biopsy of the left iliac bone positive for metastatic breast cancer   03/12/2014 Treatment Plan Change Ibrance with letrozole and Xgeva    07/22/2014 PET scan New hypermetabolic lymph nodes in the neck and chest, new abnormalities in the liver without underlying CT imaging abnormality, both adrenal glands are hypermetabolic widespread bony metastases as before   08/09/2014 -  Chemotherapy Halaven day 1, day 8 every 3 weeks   10/04/2014 Initial Biopsy Bone marrow biopsy showing extensive infiltration of breast cancer in the bone marrow; CT chest abdomen and pelvis after 3 cycles of Halaven: reveals stable bone disease and stable liver lesions    CHIEF COMPLIANT: Cycle 5  Halaven  INTERVAL HISTORY: [name] is a 59 year old with above-mentioned history of metastatic breast cancer currently on palliative chemotherapy with Halaven. Today she is due for cycle 5. She tolerates this well with no complaints. She denies fevers, chills, nausea, vomiting, or changes in bowel or bladder habits. She does not need compazine or zofran at all. She denies neuropathy symptoms.  REVIEW OF SYSTEMS:   Constitutional: Denies fevers, chills or abnormal weight loss Eyes: Denies blurriness of vision Ears, nose, mouth, throat, and face: Denies mucositis or sore throat Respiratory: Denies cough, dyspnea or wheezes Cardiovascular: Denies palpitation, chest discomfort or lower extremity swelling Gastrointestinal:  Denies nausea, heartburn or change in bowel habits Skin: Denies abnormal skin rashes Lymphatics: Denies new lymphadenopathy or easy bruising Neurological:Denies numbness, tingling or new weaknesses Behavioral/Psych: Mood is stable, no new changes   All other systems were reviewed with the patient and are negative.  I have reviewed the past medical history, past surgical history, social history and family history with the patient and they are unchanged from previous note.  ALLERGIES:  is allergic to penicillins.  MEDICATIONS:  Current Outpatient Prescriptions  Medication Sig Dispense Refill  . Calcium Carbonate-Vit D-Min (CALCIUM 1200 PO) Take 1 tablet by mouth daily at 12 noon.     . lidocaine-prilocaine (EMLA) cream Apply to affected area once 30 g 3  . Multiple Vitamins-Minerals (MULTIVITAMIN PO) Take 1 tablet by mouth daily at 12 noon.     Marland Kitchen UNABLE TO FIND J2878 Silicone Breast Prosthesis, Rt quantity 1 L8020 Mastectomy form, Rt, quantity 1 L8000 Mastectomy bra, quantity  6    . UNABLE TO FIND 174.9 Malignant Neoplasm   Mastectomy Right  L8030-Silicone Breast Prosthesis-1  L8000- Mastectomy Bra-2 1 each 0  . Alum & Mag Hydroxide-Simeth (MAGIC MOUTHWASH  W/LIDOCAINE) SOLN Take 5 mLs by mouth 4 (four) times daily as needed for mouth pain. (Patient not taking: Reported on 11/01/2014) 120 mL 0  . ibuprofen (ADVIL,MOTRIN) 200 MG tablet Take 200 mg by mouth every 4 (four) hours as needed for moderate pain.    Marland Kitchen LORazepam (ATIVAN) 0.5 MG tablet TAKE 1 TABLET EVERY 6 HOURS AS NEEDED (Patient not taking: Reported on 11/01/2014) 30 tablet 0  . ondansetron (ZOFRAN) 8 MG tablet Take 1 tablet (8 mg total) by mouth 2 (two) times daily. The day after chemo for 2 days, then every 8 hours PRN for N/V (Patient not taking: Reported on 11/01/2014) 30 tablet 1  . OVER THE COUNTER MEDICATION Take 4 tablets by mouth 2 (two) times daily. ([place] Tail)    . oxyCODONE-acetaminophen (PERCOCET/ROXICET) 5-325 MG per tablet 1-2 tabs every 6 hours as needed (Patient not taking: Reported on 11/01/2014) 30 tablet 0  . prochlorperazine (COMPAZINE) 10 MG tablet Take 1 tablet (10 mg total) by mouth every 6 (six) hours as needed (Nausea or vomiting). (Patient not taking: Reported on 11/01/2014) 30 tablet 1  . traMADol (ULTRAM) 50 MG tablet Take 50 mg by mouth every 6 (six) hours as needed for moderate pain.     No current facility-administered medications for this visit.   Facility-Administered Medications Ordered in Other Visits  Medication Dose Route Frequency Provider Last Rate Last Dose  . eriBULin mesylate (HALAVEN) 2 mg in sodium chloride 0.9 % 100 mL chemo infusion  1.15 mg/m2 (Treatment Plan Actual) Intravenous Once Serena Croissant, MD      . heparin lock flush 100 unit/mL  500 Units Intracatheter Once PRN Serena Croissant, MD      . ondansetron (ZOFRAN) 8 mg, dexamethasone (DECADRON) 10 mg in sodium chloride 0.9 % 50 mL IVPB   Intravenous Once Serena Croissant, MD      . sodium chloride 0.9 % injection 10 mL  10 mL Intracatheter PRN Serena Croissant, MD        PHYSICAL EXAMINATION: ECOG PERFORMANCE STATUS: 1 - Symptomatic but completely ambulatory  Filed Vitals:   11/01/14 1340  BP:  112/56  Pulse: 87  Temp: 98.6 F (37 C)  Resp: 18   Filed Weights   11/01/14 1340  Weight: 145 lb 14.4 oz (66.18 kg)    GENERAL:alert, no distress and comfortable SKIN: skin color, texture, turgor are normal, no rashes or significant lesions EYES: normal, Conjunctiva are pink and non-injected, sclera clear OROPHARYNX:no exudate, no erythema and lips, buccal mucosa, and tongue normal  NECK: supple, thyroid normal size, non-tender, without nodularity LYMPH:  no palpable lymphadenopathy in the cervical, axillary or inguinal LUNGS: clear to auscultation and percussion with normal breathing effort HEART: regular rate & rhythm and no murmurs and no lower extremity edema ABDOMEN:abdomen soft, non-tender and normal bowel sounds Musculoskeletal:no cyanosis of digits and no clubbing  NEURO: alert & oriented x 3 with fluent speech, no focal motor/sensory deficits  LABORATORY DATA:  I have reviewed the data as listed   Chemistry      Component Value Date/Time   NA 143 11/01/2014 1320   NA 141 08/30/2014 0953   K 4.3 11/01/2014 1320   K 4.1 08/30/2014 0953   CL 110 08/30/2014 0953   CL 106 10/23/2012 1459  CO2 25 11/01/2014 1320   CO2 24 08/30/2014 0953   BUN 9.4 11/01/2014 1320   BUN 9 08/30/2014 0953   CREATININE 0.7 11/01/2014 1320   CREATININE 0.67 08/30/2014 0953   CREATININE 0.82 08/18/2011 1056      Component Value Date/Time   CALCIUM 7.7* 11/01/2014 1320   CALCIUM 7.3* 08/30/2014 0953   ALKPHOS 142 11/01/2014 1320   ALKPHOS 414* 08/30/2014 0953   AST 25 11/01/2014 1320   AST 59* 08/30/2014 0953   ALT 19 11/01/2014 1320   ALT 38* 08/30/2014 0953   BILITOT 0.54 11/01/2014 1320   BILITOT 0.3 08/30/2014 0953       Lab Results  Component Value Date   WBC 2.9* 11/01/2014   HGB 9.2* 11/01/2014   HCT 29.3* 11/01/2014   MCV 90.2 11/01/2014   PLT 180 11/01/2014   NEUTROABS 1.7 11/01/2014    ASSESSMENT & PLAN:  Cancer of central portion of right female  breast Metastatic breast cancer with extensive bone metastases including the axial and appendicular skeleton with a prior history of right breast cancer T2 N1 ER/PR positive HER-2 negative, biopsy-proven metastatic disease, patient previously refused adjuvant antiestrogen therapy and adjuvant chemotherapy. Treated with Ibrance plus letrozole plus X Geva since 03/12/2014. PET/CT 07/22/2014 revealed multiple lymph nodes, multiple liver lesions, adrenal lesions, widespread bone metastases, started Halaven 08/09/2014  Treatment plan: Halaven day 1 and day 8 every 3 weeks started 08/09/2014 Current treatment: Halaven cycle 5 day 1  Chemotherapy toxicities: 1. Elevated AST and ALT and alkaline phosphatase: Due to liver involvement. She will need to be followed up closely on her liver function tests. All normal today 2. Thrombocytopenia: Most likely related to bone marrow involvement by her breast cancer. Count improved to 180 today.  3. Neutropenia: This had improved with Neulasta injection but patient would like to withhold the Neulasta and see how she does without it so we will discontinue it with this cycle 4. Anemia: hemoglobin up to 9.2  Plan: Continue to closely monitor counts Return to clinic in 3 weeks for cycle 6   No orders of the defined types were placed in this encounter.   The patient has a good understanding of the overall plan. she agrees with it. she will call with any problems that may develop before the next visit here.   Laurie Panda, NP

## 2014-11-08 ENCOUNTER — Other Ambulatory Visit (HOSPITAL_BASED_OUTPATIENT_CLINIC_OR_DEPARTMENT_OTHER): Payer: 59

## 2014-11-08 ENCOUNTER — Ambulatory Visit (HOSPITAL_BASED_OUTPATIENT_CLINIC_OR_DEPARTMENT_OTHER): Payer: 59

## 2014-11-08 VITALS — BP 113/52 | HR 80 | Temp 98.7°F | Resp 18

## 2014-11-08 DIAGNOSIS — C50111 Malignant neoplasm of central portion of right female breast: Secondary | ICD-10-CM

## 2014-11-08 DIAGNOSIS — C7951 Secondary malignant neoplasm of bone: Secondary | ICD-10-CM

## 2014-11-08 DIAGNOSIS — Z5111 Encounter for antineoplastic chemotherapy: Secondary | ICD-10-CM | POA: Diagnosis not present

## 2014-11-08 LAB — CBC WITH DIFFERENTIAL/PLATELET
BASO%: 1 % (ref 0.0–2.0)
Basophils Absolute: 0 10*3/uL (ref 0.0–0.1)
EOS%: 0.6 % (ref 0.0–7.0)
Eosinophils Absolute: 0 10*3/uL (ref 0.0–0.5)
HCT: 30.5 % — ABNORMAL LOW (ref 34.8–46.6)
HGB: 9.7 g/dL — ABNORMAL LOW (ref 11.6–15.9)
LYMPH#: 0.8 10*3/uL — AB (ref 0.9–3.3)
LYMPH%: 24.9 % (ref 14.0–49.7)
MCH: 28.3 pg (ref 25.1–34.0)
MCHC: 31.8 g/dL (ref 31.5–36.0)
MCV: 89.1 fL (ref 79.5–101.0)
MONO#: 0.2 10*3/uL (ref 0.1–0.9)
MONO%: 6.8 % (ref 0.0–14.0)
NEUT#: 2.2 10*3/uL (ref 1.5–6.5)
NEUT%: 66.7 % (ref 38.4–76.8)
Platelets: 204 10*3/uL (ref 145–400)
RBC: 3.43 10*6/uL — ABNORMAL LOW (ref 3.70–5.45)
RDW: 21.7 % — AB (ref 11.2–14.5)
WBC: 3.3 10*3/uL — ABNORMAL LOW (ref 3.9–10.3)

## 2014-11-08 LAB — COMPREHENSIVE METABOLIC PANEL (CC13)
ALK PHOS: 140 U/L (ref 40–150)
ALT: 27 U/L (ref 0–55)
AST: 27 U/L (ref 5–34)
Albumin: 3.6 g/dL (ref 3.5–5.0)
Anion Gap: 6 mEq/L (ref 3–11)
BILIRUBIN TOTAL: 0.43 mg/dL (ref 0.20–1.20)
BUN: 8.5 mg/dL (ref 7.0–26.0)
CO2: 24 mEq/L (ref 22–29)
Calcium: 7.7 mg/dL — ABNORMAL LOW (ref 8.4–10.4)
Chloride: 113 mEq/L — ABNORMAL HIGH (ref 98–109)
Creatinine: 0.6 mg/dL (ref 0.6–1.1)
Glucose: 102 mg/dl (ref 70–140)
Potassium: 3.9 mEq/L (ref 3.5–5.1)
SODIUM: 143 meq/L (ref 136–145)
TOTAL PROTEIN: 6.3 g/dL — AB (ref 6.4–8.3)

## 2014-11-08 MED ORDER — HEPARIN SOD (PORK) LOCK FLUSH 100 UNIT/ML IV SOLN
500.0000 [IU] | Freq: Once | INTRAVENOUS | Status: AC | PRN
  Administered 2014-11-08: 500 [IU]
  Filled 2014-11-08: qty 5

## 2014-11-08 MED ORDER — SODIUM CHLORIDE 0.9 % IV SOLN
Freq: Once | INTRAVENOUS | Status: AC
  Administered 2014-11-08: 09:00:00 via INTRAVENOUS

## 2014-11-08 MED ORDER — SODIUM CHLORIDE 0.9 % IJ SOLN
10.0000 mL | INTRAMUSCULAR | Status: DC | PRN
  Administered 2014-11-08: 10 mL
  Filled 2014-11-08: qty 10

## 2014-11-08 MED ORDER — SODIUM CHLORIDE 0.9 % IV SOLN
Freq: Once | INTRAVENOUS | Status: AC
  Administered 2014-11-08: 09:00:00 via INTRAVENOUS
  Filled 2014-11-08: qty 4

## 2014-11-08 MED ORDER — SODIUM CHLORIDE 0.9 % IV SOLN
2.0000 mg | Freq: Once | INTRAVENOUS | Status: AC
  Administered 2014-11-08: 2 mg via INTRAVENOUS
  Filled 2014-11-08: qty 4

## 2014-11-08 NOTE — Patient Instructions (Signed)
[facility] Discharge Instructions for Patients Receiving Chemotherapy  Today you received the following chemotherapy agents Halaven.  To help prevent nausea and vomiting after your treatment, we encourage you to take your nausea medication as prescribed.   If you develop nausea and vomiting that is not controlled by your nausea medication, call the clinic.   BELOW ARE SYMPTOMS THAT SHOULD BE REPORTED IMMEDIATELY:  *FEVER GREATER THAN 100.5 F  *CHILLS WITH OR WITHOUT FEVER  NAUSEA AND VOMITING THAT IS NOT CONTROLLED WITH YOUR NAUSEA MEDICATION  *UNUSUAL SHORTNESS OF BREATH  *UNUSUAL BRUISING OR BLEEDING  TENDERNESS IN MOUTH AND THROAT WITH OR WITHOUT PRESENCE OF ULCERS  *URINARY PROBLEMS  *BOWEL PROBLEMS  UNUSUAL RASH Items with * indicate a potential emergency and should be followed up as soon as possible.  Feel free to call the clinic you have any questions or concerns. The clinic phone number is [phone].  Please show the CHEMO ALERT CARD at check-in to the Emergency Department and triage nurse.   

## 2014-11-22 ENCOUNTER — Other Ambulatory Visit (HOSPITAL_BASED_OUTPATIENT_CLINIC_OR_DEPARTMENT_OTHER): Payer: 59

## 2014-11-22 ENCOUNTER — Telehealth: Payer: Self-pay | Admitting: Hematology and Oncology

## 2014-11-22 ENCOUNTER — Ambulatory Visit (HOSPITAL_BASED_OUTPATIENT_CLINIC_OR_DEPARTMENT_OTHER): Payer: 59 | Admitting: Hematology and Oncology

## 2014-11-22 ENCOUNTER — Ambulatory Visit (HOSPITAL_BASED_OUTPATIENT_CLINIC_OR_DEPARTMENT_OTHER): Payer: 59

## 2014-11-22 ENCOUNTER — Encounter: Payer: Self-pay | Admitting: Hematology and Oncology

## 2014-11-22 VITALS — BP 126/64 | HR 84 | Temp 98.2°F | Resp 18 | Ht 65.0 in | Wt 149.5 lb

## 2014-11-22 DIAGNOSIS — D702 Other drug-induced agranulocytosis: Secondary | ICD-10-CM | POA: Diagnosis not present

## 2014-11-22 DIAGNOSIS — C50111 Malignant neoplasm of central portion of right female breast: Secondary | ICD-10-CM

## 2014-11-22 DIAGNOSIS — D6481 Anemia due to antineoplastic chemotherapy: Secondary | ICD-10-CM

## 2014-11-22 DIAGNOSIS — C7951 Secondary malignant neoplasm of bone: Secondary | ICD-10-CM | POA: Diagnosis not present

## 2014-11-22 DIAGNOSIS — D696 Thrombocytopenia, unspecified: Secondary | ICD-10-CM

## 2014-11-22 DIAGNOSIS — Z5111 Encounter for antineoplastic chemotherapy: Secondary | ICD-10-CM | POA: Diagnosis not present

## 2014-11-22 LAB — CBC WITH DIFFERENTIAL/PLATELET
BASO%: 0.7 % (ref 0.0–2.0)
Basophils Absolute: 0 10*3/uL (ref 0.0–0.1)
EOS ABS: 0 10*3/uL (ref 0.0–0.5)
EOS%: 0.7 % (ref 0.0–7.0)
HEMATOCRIT: 34.7 % — AB (ref 34.8–46.6)
HGB: 10.7 g/dL — ABNORMAL LOW (ref 11.6–15.9)
LYMPH%: 33.2 % (ref 14.0–49.7)
MCH: 28.2 pg (ref 25.1–34.0)
MCHC: 30.8 g/dL — AB (ref 31.5–36.0)
MCV: 91.3 fL (ref 79.5–101.0)
MONO#: 0.5 10*3/uL (ref 0.1–0.9)
MONO%: 14.8 % — AB (ref 0.0–14.0)
NEUT#: 1.5 10*3/uL (ref 1.5–6.5)
NEUT%: 50.6 % (ref 38.4–76.8)
PLATELETS: 172 10*3/uL (ref 145–400)
RBC: 3.8 10*6/uL (ref 3.70–5.45)
RDW: 18.9 % — ABNORMAL HIGH (ref 11.2–14.5)
WBC: 3 10*3/uL — AB (ref 3.9–10.3)
lymph#: 1 10*3/uL (ref 0.9–3.3)

## 2014-11-22 LAB — COMPREHENSIVE METABOLIC PANEL (CC13)
ALT: 13 U/L (ref 0–55)
ANION GAP: 8 meq/L (ref 3–11)
AST: 23 U/L (ref 5–34)
Albumin: 3.7 g/dL (ref 3.5–5.0)
Alkaline Phosphatase: 110 U/L (ref 40–150)
BILIRUBIN TOTAL: 0.38 mg/dL (ref 0.20–1.20)
BUN: 12 mg/dL (ref 7.0–26.0)
CALCIUM: 8.3 mg/dL — AB (ref 8.4–10.4)
CO2: 24 meq/L (ref 22–29)
CREATININE: 0.7 mg/dL (ref 0.6–1.1)
Chloride: 111 mEq/L — ABNORMAL HIGH (ref 98–109)
Glucose: 76 mg/dl (ref 70–140)
Potassium: 4.3 mEq/L (ref 3.5–5.1)
SODIUM: 144 meq/L (ref 136–145)
Total Protein: 6.5 g/dL (ref 6.4–8.3)

## 2014-11-22 MED ORDER — SODIUM CHLORIDE 0.9 % IV SOLN
1.1500 mg/m2 | Freq: Once | INTRAVENOUS | Status: AC
  Administered 2014-11-22: 2 mg via INTRAVENOUS
  Filled 2014-11-22: qty 4

## 2014-11-22 MED ORDER — SODIUM CHLORIDE 0.9 % IV SOLN
Freq: Once | INTRAVENOUS | Status: AC
  Administered 2014-11-22: 10:00:00 via INTRAVENOUS
  Filled 2014-11-22: qty 4

## 2014-11-22 MED ORDER — SODIUM CHLORIDE 0.9 % IJ SOLN
10.0000 mL | INTRAMUSCULAR | Status: DC | PRN
  Administered 2014-11-22: 10 mL
  Filled 2014-11-22: qty 10

## 2014-11-22 MED ORDER — SODIUM CHLORIDE 0.9 % IV SOLN
Freq: Once | INTRAVENOUS | Status: AC
  Administered 2014-11-22: 10:00:00 via INTRAVENOUS

## 2014-11-22 MED ORDER — HEPARIN SOD (PORK) LOCK FLUSH 100 UNIT/ML IV SOLN
500.0000 [IU] | Freq: Once | INTRAVENOUS | Status: AC | PRN
  Administered 2014-11-22: 500 [IU]
  Filled 2014-11-22: qty 5

## 2014-11-22 NOTE — Telephone Encounter (Signed)
Gave and printed appt sched and avs for pt for July  °

## 2014-11-22 NOTE — Patient Instructions (Signed)
[facility] Discharge Instructions for Patients Receiving Chemotherapy  Today you received the following chemotherapy agents: Halaven.   To help prevent nausea and vomiting after your treatment, we encourage you to take your nausea medication: Zofran. Take one every 8 hours as needed.    If you develop nausea and vomiting that is not controlled by your nausea medication, call the clinic.   BELOW ARE SYMPTOMS THAT SHOULD BE REPORTED IMMEDIATELY:  *FEVER GREATER THAN 100.5 F  *CHILLS WITH OR WITHOUT FEVER  NAUSEA AND VOMITING THAT IS NOT CONTROLLED WITH YOUR NAUSEA MEDICATION  *UNUSUAL SHORTNESS OF BREATH  *UNUSUAL BRUISING OR BLEEDING  TENDERNESS IN MOUTH AND THROAT WITH OR WITHOUT PRESENCE OF ULCERS  *URINARY PROBLEMS  *BOWEL PROBLEMS  UNUSUAL RASH Items with * indicate a potential emergency and should be followed up as soon as possible.  Feel free to call the clinic should you have any questions or concerns. The clinic phone number is [phone].  Please show the [place] at check-in to the Emergency Department and triage nurse.

## 2014-11-22 NOTE — Progress Notes (Signed)
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   11/09/2012 Surgery 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 one out of 13 lymph nodes positive ER 100%, PR 29%, HER-2 negative ratio 0.93 Ki-67 89%   09/26/2013 - 11/08/2013 Radiation Therapy Radiation to right chest wall and right supraclavicular region to a dose of 50.4 gray at 1.8 gray per fraction    Anti-estrogen oral therapy Patient refused antiestrogen therapy and chemotherapy, receives Blue crystal treatment from [place] (patch that sucks out impurities)   02/21/2014 PET scan Diffuse bone metastases involving the axial and appendicular skeleton, biopsy of the left iliac bone positive for metastatic breast cancer   03/12/2014 Treatment Plan Change Ibrance with letrozole and Xgeva    07/22/2014 PET scan New hypermetabolic lymph nodes in the neck and chest, new abnormalities in the liver without underlying CT imaging abnormality, both adrenal glands are hypermetabolic widespread bony metastases as before   08/09/2014 -  Chemotherapy Halaven day 1, day 8 every 3 weeks   10/04/2014 Initial Biopsy Bone marrow biopsy showing extensive infiltration of breast cancer in the bone marrow; CT chest abdomen and pelvis after 3 cycles of Halaven: reveals stable bone disease and stable liver lesions    CHIEF COMPLIANT: Cycle 6  Halaven  INTERVAL HISTORY: [name] is a 59 year old with above-mentioned history metastatic breast cancer currently on palliative chemotherapy with Halaven. She is tolerating this chemotherapy extremely well. She has very minimal to no side effects. Energy levels have improved dramatically on chemotherapy. Anemia has improved markedly.  REVIEW OF SYSTEMS:   Constitutional: Denies fevers, chills or abnormal weight loss Eyes: Denies blurriness of vision Ears, nose, mouth, throat, and face: Denies mucositis or sore throat Respiratory: Denies cough, dyspnea or wheezes Cardiovascular: Denies palpitation, chest discomfort or lower extremity swelling Gastrointestinal:  Denies nausea, heartburn or change in bowel habits Skin: Denies abnormal skin rashes Lymphatics: Denies new lymphadenopathy or easy bruising Neurological:Denies numbness, tingling or new weaknesses Behavioral/Psych: Mood is stable, no new changes  Breast:  denies any pain or lumps or nodules in either breasts All other systems were reviewed with the patient and are negative.  I have reviewed the past medical history, past surgical history, social history and family history with the patient and they are unchanged from previous note.  ALLERGIES:  is allergic to penicillins.  MEDICATIONS:  Current Outpatient Prescriptions  Medication Sig Dispense Refill  . Alum & Mag Hydroxide-Simeth (MAGIC MOUTHWASH W/LIDOCAINE) SOLN Take 5 mLs by mouth 4 (four) times daily as needed for mouth pain. 120 mL 0  . Calcium Carbonate-Vit D-Min (CALCIUM 1200 PO) Take 1 tablet by mouth daily at 12 noon.     Marland Kitchen ibuprofen (ADVIL,MOTRIN) 200 MG tablet Take 200 mg by mouth every 4 (four) hours as needed for moderate pain.    Marland Kitchen lidocaine-prilocaine (EMLA) cream Apply to  affected area once 30 g 3  . LORazepam (ATIVAN) 0.5 MG tablet TAKE 1 TABLET EVERY 6 HOURS AS NEEDED 30 tablet 0  . Multiple Vitamins-Minerals (MULTIVITAMIN PO) Take 1 tablet by mouth  daily at 12 noon.     . ondansetron (ZOFRAN) 8 MG tablet Take 1 tablet (8 mg total) by mouth 2 (two) times daily. The day after chemo for 2 days, then every 8 hours PRN for N/V 30 tablet 1  . OVER THE COUNTER MEDICATION Take 4 tablets by mouth 2 (two) times daily. ([place] Tail)    . oxyCODONE-acetaminophen (PERCOCET/ROXICET) 5-325 MG per tablet 1-2 tabs every 6 hours as needed 30 tablet 0  . prochlorperazine (COMPAZINE) 10 MG tablet Take 1 tablet (10 mg total) by mouth every 6 (six) hours as needed (Nausea or vomiting). 30 tablet 1  . traMADol (ULTRAM) 50 MG tablet Take 50 mg by mouth every 6 (six) hours as needed for moderate pain.    Marland Kitchen UNABLE TO FIND E4235 Silicone Breast Prosthesis, Rt quantity 1 L8020 Mastectomy form, Rt, quantity 1 L8000 Mastectomy bra, quantity 6    . UNABLE TO FIND 174.9 Malignant Neoplasm   Mastectomy Right  L8030-Silicone Breast Prosthesis-1  L8000- Mastectomy Bra-2 1 each 0   No current facility-administered medications for this visit.    PHYSICAL EXAMINATION: ECOG PERFORMANCE STATUS: 0 - Asymptomatic  Filed Vitals:   11/22/14 0900  BP: 126/64  Pulse: 84  Temp: 98.2 F (36.8 C)  Resp: 18   Filed Weights   11/22/14 0900  Weight: 149 lb 8 oz (67.813 kg)    GENERAL:alert, no distress and comfortable SKIN: skin color, texture, turgor are normal, no rashes or significant lesions EYES: normal, Conjunctiva are pink and non-injected, sclera clear OROPHARYNX:no exudate, no erythema and lips, buccal mucosa, and tongue normal  NECK: supple, thyroid normal size, non-tender, without nodularity LYMPH:  no palpable lymphadenopathy in the cervical, axillary or inguinal LUNGS: clear to auscultation and percussion with normal breathing effort HEART: regular rate & rhythm and no murmurs and no lower extremity edema ABDOMEN:abdomen soft, non-tender and normal bowel sounds Musculoskeletal:no cyanosis of digits and no clubbing  NEURO: alert & oriented x 3 with fluent  speech, no focal motor/sensory deficits   LABORATORY DATA:  I have reviewed the data as listed   Chemistry      Component Value Date/Time   NA 143 11/08/2014 0817   NA 141 08/30/2014 0953   K 3.9 11/08/2014 0817   K 4.1 08/30/2014 0953   CL 110 08/30/2014 0953   CL 106 10/23/2012 1459   CO2 24 11/08/2014 0817   CO2 24 08/30/2014 0953   BUN 8.5 11/08/2014 0817   BUN 9 08/30/2014 0953   CREATININE 0.6 11/08/2014 0817   CREATININE 0.67 08/30/2014 0953   CREATININE 0.82 08/18/2011 1056      Component Value Date/Time   CALCIUM 7.7* 11/08/2014 0817   CALCIUM 7.3* 08/30/2014 0953   ALKPHOS 140 11/08/2014 0817   ALKPHOS 414* 08/30/2014 0953   AST 27 11/08/2014 0817   AST 59* 08/30/2014 0953   ALT 27 11/08/2014 0817   ALT 38* 08/30/2014 0953   BILITOT 0.43 11/08/2014 0817   BILITOT 0.3 08/30/2014 0953       Lab Results  Component Value Date   WBC 3.0* 11/22/2014   HGB 10.7* 11/22/2014   HCT 34.7* 11/22/2014   MCV 91.3 11/22/2014   PLT 172 11/22/2014   NEUTROABS 1.5 11/22/2014   ASSESSMENT & PLAN:  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 Treated with Ibrance plus letrozole plus X Geva since 03/12/2014. PET/CT 07/22/2014 revealed multiple lymph nodes, multiple liver lesions, adrenal lesions, widespread bone metastases, started Halaven 08/09/2014  Treatment plan: Halaven day 1 and day 8 every 3 weeks started 08/09/2014 Current treatment: Halaven cycle 6 day 1  Chemotherapy toxicities: 1. Elevated AST and ALT and alkaline phosphatase: Due to liver involvement. She will need to be followed up closely on her liver function tests. All normal today 2. Thrombocytopenia: Most  likely related to bone marrow involvement by her breast cancer. Count improved to 180 today.  3. Neutropenia: This had improved with Neulasta injection but patient would like to withhold the Neulasta and see how she does without it so we will discontinue it with this cycle 4. Anemia: hemoglobin up to 9.2  Plan: Continue to closely monitor counts PET/CT scan after the cycle to assess response to treatment  Return to clinic in 3 weeks to review that CT scan results and to decide whether to continue with the chemotherapy or to change treatments.   Orders Placed This Encounter  Procedures  . NM PET Image Restag (PS) Skull Base To Thigh    Standing Status: Future     Number of Occurrences:      Standing Expiration Date: 11/22/2015    Order Specific Question:  Reason for Exam (SYMPTOM  OR DIAGNOSIS REQUIRED)    Answer:  Metastatic breast cancer on chemotherapy    Order Specific Question:  Preferred imaging location?    Answer:  [facility]   The patient has a good understanding of the overall plan. she agrees with it. she will call with any problems that may develop before the next visit here.   Rulon Eisenmenger, MD

## 2014-11-22 NOTE — Assessment & Plan Note (Signed)
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 Treated with Ibrance plus letrozole plus X Geva since 03/12/2014. PET/CT 07/22/2014 revealed multiple lymph nodes, multiple liver lesions, adrenal lesions, widespread bone metastases, started Halaven 08/09/2014  Treatment plan: Halaven day 1 and day 8 every 3 weeks started 08/09/2014 Current treatment: Halaven cycle 6 day 1  Chemotherapy toxicities: 1. Elevated AST and ALT and alkaline phosphatase: Due to liver involvement. She will need to be followed up closely on her liver function tests. All normal today 2. Thrombocytopenia: Most likely related to bone marrow involvement by her breast cancer. Count improved to 180 today.  3. Neutropenia: This had improved with Neulasta injection but patient would like to withhold the Neulasta and see how she does without it so we will discontinue it with this cycle 4. Anemia: hemoglobin up to 9.2  Plan: Continue to closely monitor counts PET/CT scan after the cycle to assess response to treatment  Return to clinic in 3 weeks to review that CT scan results and to decide whether to continue with the chemotherapy or to change treatments.

## 2014-11-25 ENCOUNTER — Encounter: Payer: Self-pay | Admitting: Hematology and Oncology

## 2014-11-25 NOTE — Progress Notes (Signed)
I placed form from [employer] on desk of nurse for dr. Lindi Adie.

## 2014-11-26 ENCOUNTER — Encounter: Payer: Self-pay | Admitting: Hematology and Oncology

## 2014-11-26 NOTE — Progress Notes (Signed)
I left message for patient I will call her when forms have been signed and have a copy for her records she can get on Friday when she is in the office.

## 2014-11-27 ENCOUNTER — Encounter: Payer: Self-pay | Admitting: Hematology and Oncology

## 2014-11-27 NOTE — Progress Notes (Signed)
[employer] forms that were faxed had some sections missing, so they sent over another fax to be filled out. I placed form on desk of dr. Lindi Adie.

## 2014-11-29 ENCOUNTER — Encounter: Payer: Self-pay | Admitting: Hematology and Oncology

## 2014-11-29 ENCOUNTER — Telehealth: Payer: Self-pay

## 2014-11-29 ENCOUNTER — Ambulatory Visit (HOSPITAL_BASED_OUTPATIENT_CLINIC_OR_DEPARTMENT_OTHER): Payer: 59

## 2014-11-29 ENCOUNTER — Other Ambulatory Visit (HOSPITAL_BASED_OUTPATIENT_CLINIC_OR_DEPARTMENT_OTHER): Payer: 59

## 2014-11-29 VITALS — BP 122/62 | HR 81 | Temp 98.5°F | Resp 18

## 2014-11-29 DIAGNOSIS — C50111 Malignant neoplasm of central portion of right female breast: Secondary | ICD-10-CM | POA: Diagnosis not present

## 2014-11-29 DIAGNOSIS — C7951 Secondary malignant neoplasm of bone: Secondary | ICD-10-CM | POA: Diagnosis not present

## 2014-11-29 LAB — CBC WITH DIFFERENTIAL/PLATELET
BASO%: 0.6 % (ref 0.0–2.0)
BASOS ABS: 0 10*3/uL (ref 0.0–0.1)
EOS ABS: 0 10*3/uL (ref 0.0–0.5)
EOS%: 1 % (ref 0.0–7.0)
HEMATOCRIT: 34.5 % — AB (ref 34.8–46.6)
HEMOGLOBIN: 11 g/dL — AB (ref 11.6–15.9)
LYMPH#: 1.1 10*3/uL (ref 0.9–3.3)
LYMPH%: 31.3 % (ref 14.0–49.7)
MCH: 28 pg (ref 25.1–34.0)
MCHC: 32 g/dL (ref 31.5–36.0)
MCV: 87.5 fL (ref 79.5–101.0)
MONO#: 0.2 10*3/uL (ref 0.1–0.9)
MONO%: 6.3 % (ref 0.0–14.0)
NEUT#: 2.2 10*3/uL (ref 1.5–6.5)
NEUT%: 60.8 % (ref 38.4–76.8)
PLATELETS: 209 10*3/uL (ref 145–400)
RBC: 3.94 10*6/uL (ref 3.70–5.45)
RDW: 20.3 % — AB (ref 11.2–14.5)
WBC: 3.7 10*3/uL — ABNORMAL LOW (ref 3.9–10.3)

## 2014-11-29 LAB — COMPREHENSIVE METABOLIC PANEL (CC13)
ALK PHOS: 106 U/L (ref 40–150)
ALT: 19 U/L (ref 0–55)
AST: 20 U/L (ref 5–34)
Albumin: 3.7 g/dL (ref 3.5–5.0)
Anion Gap: 8 mEq/L (ref 3–11)
BUN: 15.4 mg/dL (ref 7.0–26.0)
CALCIUM: 8.3 mg/dL — AB (ref 8.4–10.4)
CO2: 23 meq/L (ref 22–29)
CREATININE: 0.7 mg/dL (ref 0.6–1.1)
Chloride: 111 mEq/L — ABNORMAL HIGH (ref 98–109)
EGFR: 89 mL/min/{1.73_m2} — ABNORMAL LOW (ref >90)
Glucose: 105 mg/dl (ref 70–140)
Potassium: 4.1 mEq/L (ref 3.5–5.1)
SODIUM: 142 meq/L (ref 136–145)
Total Bilirubin: 0.43 mg/dL (ref 0.20–1.20)
Total Protein: 6.5 g/dL (ref 6.4–8.3)

## 2014-11-29 MED ORDER — SODIUM CHLORIDE 0.9 % IV SOLN
Freq: Once | INTRAVENOUS | Status: AC
  Administered 2014-11-29: 11:00:00 via INTRAVENOUS

## 2014-11-29 MED ORDER — SODIUM CHLORIDE 0.9 % IV SOLN
1.1500 mg/m2 | Freq: Once | INTRAVENOUS | Status: AC
  Administered 2014-11-29: 2 mg via INTRAVENOUS
  Filled 2014-11-29: qty 4

## 2014-11-29 MED ORDER — HEPARIN SOD (PORK) LOCK FLUSH 100 UNIT/ML IV SOLN
500.0000 [IU] | Freq: Once | INTRAVENOUS | Status: AC | PRN
  Administered 2014-11-29: 500 [IU]
  Filled 2014-11-29: qty 5

## 2014-11-29 MED ORDER — SODIUM CHLORIDE 0.9 % IJ SOLN
10.0000 mL | INTRAMUSCULAR | Status: DC | PRN
  Administered 2014-11-29: 10 mL
  Filled 2014-11-29: qty 10

## 2014-11-29 MED ORDER — DEXAMETHASONE SODIUM PHOSPHATE 100 MG/10ML IJ SOLN
Freq: Once | INTRAMUSCULAR | Status: AC
  Administered 2014-11-29: 11:00:00 via INTRAVENOUS
  Filled 2014-11-29: qty 4

## 2014-11-29 NOTE — Telephone Encounter (Signed)
Medical Certification form faxed to [employer].  Sent to scan.

## 2014-11-29 NOTE — Patient Instructions (Signed)
[facility] Discharge Instructions for Patients Receiving Chemotherapy  Today you received the following chemotherapy agents Halaven.  To help prevent nausea and vomiting after your treatment, we encourage you to take your nausea medication Compazine 10 mg every 6 hours as needed. If you develop nausea and vomiting that is not controlled by your nausea medication, call the clinic.   BELOW ARE SYMPTOMS THAT SHOULD BE REPORTED IMMEDIATELY:  *FEVER GREATER THAN 100.5 F  *CHILLS WITH OR WITHOUT FEVER  NAUSEA AND VOMITING THAT IS NOT CONTROLLED WITH YOUR NAUSEA MEDICATION  *UNUSUAL SHORTNESS OF BREATH  *UNUSUAL BRUISING OR BLEEDING  TENDERNESS IN MOUTH AND THROAT WITH OR WITHOUT PRESENCE OF ULCERS  *URINARY PROBLEMS  *BOWEL PROBLEMS  UNUSUAL RASH Items with * indicate a potential emergency and should be followed up as soon as possible.  Feel free to call the clinic you have any questions or concerns. The clinic phone number is [phone].  Please show the [place] at check-in to the Emergency Department and triage nurse.

## 2014-11-29 NOTE — Progress Notes (Signed)
Per Karna Christmas [employer] form she faxed 11/29/14 [phone]

## 2014-12-02 ENCOUNTER — Telehealth: Payer: Self-pay

## 2014-12-02 NOTE — Telephone Encounter (Signed)
Order faxed to [employer].  Sent to scan. 

## 2014-12-05 ENCOUNTER — Encounter: Payer: Self-pay | Admitting: Hematology and Oncology

## 2014-12-05 NOTE — Progress Notes (Signed)
Per patient matrix needs date of possible return. i refaxed to  [phone] and noted possible 87months, but could change. See prev notes of fax Terri sent. It had no estimate date of return. The patient is stage iv.

## 2014-12-06 ENCOUNTER — Ambulatory Visit (HOSPITAL_COMMUNITY)
Admission: RE | Admit: 2014-12-06 | Discharge: 2014-12-06 | Disposition: A | Discharge status: Home / Self Care | Payer: 59 | Source: Ambulatory Visit | Attending: Hematology and Oncology | Admitting: Hematology and Oncology

## 2014-12-06 DIAGNOSIS — C50111 Malignant neoplasm of central portion of right female breast: Secondary | ICD-10-CM

## 2014-12-06 DIAGNOSIS — Z9011 Acquired absence of right breast and nipple: Secondary | ICD-10-CM | POA: Insufficient documentation

## 2014-12-06 DIAGNOSIS — I700 Atherosclerosis of aorta: Secondary | ICD-10-CM | POA: Insufficient documentation

## 2014-12-06 DIAGNOSIS — C50911 Malignant neoplasm of unspecified site of right female breast: Secondary | ICD-10-CM | POA: Diagnosis present

## 2014-12-06 DIAGNOSIS — C787 Secondary malignant neoplasm of liver and intrahepatic bile duct: Secondary | ICD-10-CM | POA: Diagnosis not present

## 2014-12-06 DIAGNOSIS — Z90710 Acquired absence of both cervix and uterus: Secondary | ICD-10-CM | POA: Insufficient documentation

## 2014-12-06 DIAGNOSIS — C7951 Secondary malignant neoplasm of bone: Secondary | ICD-10-CM | POA: Diagnosis not present

## 2014-12-06 LAB — GLUCOSE, CAPILLARY: Glucose-Capillary: 80 mg/dL (ref 65–99)

## 2014-12-06 MED ORDER — FLUDEOXYGLUCOSE F - 18 (FDG) INJECTION
7.3000 | Freq: Once | INTRAVENOUS | Status: AC | PRN
  Administered 2014-12-06: 7.3 via INTRAVENOUS

## 2014-12-12 NOTE — Assessment & Plan Note (Signed)
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 Treated with Ibrance plus letrozole plus X Geva since 03/12/2014. PET/CT 07/22/2014 revealed multiple lymph nodes, multiple liver lesions, adrenal lesions, widespread bone metastases, started Halaven 08/09/2014  Treatment plan: Halaven day 1 and day 8 every 3 weeks started 08/09/2014 Current treatment: Halaven cycle 6 day 1  Chemotherapy toxicities: 1. Elevated AST and ALT and alkaline phosphatase: Due to liver involvement. She will need to be followed up closely on her liver function tests. All normal today 2. Thrombocytopenia: Most likely related to bone marrow involvement by her breast cancer. Count improved to 180 today.  3. Neutropenia: This had improved with Neulasta injection but patient would like to withhold the Neulasta and see how she does without it so we will discontinue it with this cycle 4. Anemia: hemoglobin up to 9.2  PET/CT scan 12/06/2014: Monitor response to therapy with resolution of hypermetabolic nodal metastases within the neck and the chest, improvement to resolution of hypermetabolic liver metastases, resolved right and left adrenal metastases, near complete resolution of widespread bony metastases  I discussed the result with the patient and she was ecstatic to hear these results. We will plan to continue with Halaven maintenance chemotherapy as long as she tolerates it and as long as she responds to it.

## 2014-12-13 ENCOUNTER — Telehealth: Payer: Self-pay | Admitting: Hematology and Oncology

## 2014-12-13 ENCOUNTER — Ambulatory Visit (HOSPITAL_BASED_OUTPATIENT_CLINIC_OR_DEPARTMENT_OTHER): Payer: 59 | Admitting: Hematology and Oncology

## 2014-12-13 ENCOUNTER — Other Ambulatory Visit (HOSPITAL_BASED_OUTPATIENT_CLINIC_OR_DEPARTMENT_OTHER): Payer: 59

## 2014-12-13 ENCOUNTER — Encounter: Payer: Self-pay | Admitting: Hematology and Oncology

## 2014-12-13 ENCOUNTER — Ambulatory Visit (HOSPITAL_BASED_OUTPATIENT_CLINIC_OR_DEPARTMENT_OTHER): Payer: 59

## 2014-12-13 VITALS — BP 113/72 | HR 75 | Temp 98.4°F | Resp 18 | Ht 65.0 in | Wt 151.6 lb

## 2014-12-13 DIAGNOSIS — D649 Anemia, unspecified: Secondary | ICD-10-CM

## 2014-12-13 DIAGNOSIS — C50912 Malignant neoplasm of unspecified site of left female breast: Secondary | ICD-10-CM

## 2014-12-13 DIAGNOSIS — C50111 Malignant neoplasm of central portion of right female breast: Secondary | ICD-10-CM

## 2014-12-13 DIAGNOSIS — Z5111 Encounter for antineoplastic chemotherapy: Secondary | ICD-10-CM | POA: Diagnosis not present

## 2014-12-13 DIAGNOSIS — C7951 Secondary malignant neoplasm of bone: Secondary | ICD-10-CM | POA: Diagnosis not present

## 2014-12-13 DIAGNOSIS — D709 Neutropenia, unspecified: Secondary | ICD-10-CM

## 2014-12-13 DIAGNOSIS — D696 Thrombocytopenia, unspecified: Secondary | ICD-10-CM | POA: Diagnosis not present

## 2014-12-13 LAB — CBC WITH DIFFERENTIAL/PLATELET
BASO%: 1.1 % (ref 0.0–2.0)
Basophils Absolute: 0 10*3/uL (ref 0.0–0.1)
EOS%: 0.7 % (ref 0.0–7.0)
Eosinophils Absolute: 0 10*3/uL (ref 0.0–0.5)
HEMATOCRIT: 35.4 % (ref 34.8–46.6)
HGB: 11.4 g/dL — ABNORMAL LOW (ref 11.6–15.9)
LYMPH%: 29.1 % (ref 14.0–49.7)
MCH: 27.7 pg (ref 25.1–34.0)
MCHC: 32.1 g/dL (ref 31.5–36.0)
MCV: 86.1 fL (ref 79.5–101.0)
MONO#: 0.3 10*3/uL (ref 0.1–0.9)
MONO%: 10.7 % (ref 0.0–14.0)
NEUT#: 1.7 10*3/uL (ref 1.5–6.5)
NEUT%: 58.4 % (ref 38.4–76.8)
PLATELETS: 198 10*3/uL (ref 145–400)
RBC: 4.11 10*6/uL (ref 3.70–5.45)
RDW: 19 % — ABNORMAL HIGH (ref 11.2–14.5)
WBC: 2.9 10*3/uL — ABNORMAL LOW (ref 3.9–10.3)
lymph#: 0.8 10*3/uL — ABNORMAL LOW (ref 0.9–3.3)

## 2014-12-13 LAB — COMPREHENSIVE METABOLIC PANEL (CC13)
ALT: 16 U/L (ref 0–55)
ANION GAP: 7 meq/L (ref 3–11)
AST: 23 U/L (ref 5–34)
Albumin: 3.7 g/dL (ref 3.5–5.0)
Alkaline Phosphatase: 101 U/L (ref 40–150)
BUN: 16.5 mg/dL (ref 7.0–26.0)
CALCIUM: 8.2 mg/dL — AB (ref 8.4–10.4)
CHLORIDE: 112 meq/L — AB (ref 98–109)
CO2: 25 mEq/L (ref 22–29)
Creatinine: 0.7 mg/dL (ref 0.6–1.1)
Glucose: 99 mg/dl (ref 70–140)
Potassium: 4 mEq/L (ref 3.5–5.1)
Sodium: 144 mEq/L (ref 136–145)
TOTAL PROTEIN: 6.3 g/dL — AB (ref 6.4–8.3)
Total Bilirubin: 0.38 mg/dL (ref 0.20–1.20)

## 2014-12-13 MED ORDER — HEPARIN SOD (PORK) LOCK FLUSH 100 UNIT/ML IV SOLN
500.0000 [IU] | Freq: Once | INTRAVENOUS | Status: AC | PRN
  Administered 2014-12-13: 500 [IU]
  Filled 2014-12-13: qty 5

## 2014-12-13 MED ORDER — SODIUM CHLORIDE 0.9 % IV SOLN
1.1500 mg/m2 | Freq: Once | INTRAVENOUS | Status: AC
  Administered 2014-12-13: 2 mg via INTRAVENOUS
  Filled 2014-12-13: qty 4

## 2014-12-13 MED ORDER — SODIUM CHLORIDE 0.9 % IJ SOLN
10.0000 mL | INTRAMUSCULAR | Status: DC | PRN
  Administered 2014-12-13: 10 mL
  Filled 2014-12-13: qty 10

## 2014-12-13 MED ORDER — SODIUM CHLORIDE 0.9 % IV SOLN
Freq: Once | INTRAVENOUS | Status: AC
  Administered 2014-12-13: 13:00:00 via INTRAVENOUS

## 2014-12-13 MED ORDER — SODIUM CHLORIDE 0.9 % IV SOLN
Freq: Once | INTRAVENOUS | Status: AC
  Administered 2014-12-13: 13:00:00 via INTRAVENOUS
  Filled 2014-12-13: qty 4

## 2014-12-13 NOTE — Telephone Encounter (Signed)
Appointments made and avs printed for patient °

## 2014-12-13 NOTE — Patient Instructions (Signed)
[place] Discharge Instructions for Patients Receiving Chemotherapy  Today you received the following chemotherapy agents:  Halaven  To help prevent nausea and vomiting after your treatment, we encourage you to take your nausea medication as ordered per MD.   If you develop nausea and vomiting that is not controlled by your nausea medication, call the clinic.   BELOW ARE SYMPTOMS THAT SHOULD BE REPORTED IMMEDIATELY:  *FEVER GREATER THAN 100.5 F  *CHILLS WITH OR WITHOUT FEVER  NAUSEA AND VOMITING THAT IS NOT CONTROLLED WITH YOUR NAUSEA MEDICATION  *UNUSUAL SHORTNESS OF BREATH  *UNUSUAL BRUISING OR BLEEDING  TENDERNESS IN MOUTH AND THROAT WITH OR WITHOUT PRESENCE OF ULCERS  *URINARY PROBLEMS  *BOWEL PROBLEMS  UNUSUAL RASH Items with * indicate a potential emergency and should be followed up as soon as possible.  Feel free to call the clinic you have any questions or concerns. The clinic phone number is [phone].  Please show the [place] at check-in to the Emergency Department and triage nurse.

## 2014-12-13 NOTE — Progress Notes (Signed)
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   11/09/2012 Surgery 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 one out of 13 lymph nodes positive ER 100%, PR 29%, HER-2 negative ratio 0.93 Ki-67 89%   09/26/2013 - 11/08/2013 Radiation Therapy Radiation to right chest wall and right supraclavicular region to a dose of 50.4 gray at 1.8 gray per fraction    Anti-estrogen oral therapy Patient refused antiestrogen therapy and chemotherapy, receives Blue crystal treatment from [place] (patch that sucks out impurities)   02/21/2014 PET scan Diffuse bone metastases involving the axial and appendicular skeleton, biopsy of the left iliac bone positive for metastatic breast cancer   03/12/2014 Treatment Plan Change Ibrance with letrozole and Xgeva    07/22/2014 PET scan New hypermetabolic lymph nodes in the neck and chest, new abnormalities in the liver without underlying CT imaging abnormality, both adrenal glands are hypermetabolic widespread bony metastases as before   08/09/2014 -  Chemotherapy Halaven day 1, day 8 every 3 weeks   10/04/2014 Initial Biopsy Bone marrow biopsy showing extensive infiltration of breast cancer in the bone marrow; CT chest abdomen and pelvis after 3 cycles of Halaven: reveals stable bone disease and stable liver lesions   12/06/2014 PET scan resolution of  nodal  metastases within the neck and the chest, Resolution of hypermetabolic liver metastases, resolved right and left adrenal metastases, near complete resolution of widespread bony metastases.    CHIEF COMPLIANT: Follow-up of the PET CT scan, cycle 7 Halaven  INTERVAL HISTORY: [name] is a 59 year old with above-mentioned history of metastatic breast cancer with extensive liver and bone and lymph node metastases in addition to adrenal metastases, she also had bone marrow involvement. She received 6 cycles of Halaven and underwent a PET/CT scan and is here today to discuss the results. She had remarkable clinical improvement in her symptoms and her strength and energy levels.  REVIEW OF SYSTEMS:   Constitutional: Denies fevers, chills or abnormal weight loss Eyes: Denies blurriness of vision Ears, nose, mouth, throat, and face: Denies mucositis or sore throat Respiratory: Denies cough, dyspnea or wheezes Cardiovascular: Denies palpitation, chest discomfort or lower extremity swelling Gastrointestinal:  Denies nausea, heartburn or change in bowel habits Skin: Denies abnormal skin rashes Lymphatics: Denies new lymphadenopathy or easy bruising Neurological:Denies numbness, tingling or new weaknesses Behavioral/Psych: Mood is stable, no new changes  All other systems were reviewed with the patient and are negative.  I have reviewed the past medical history, past surgical history, social history and family history with the patient and they are unchanged from previous note.  ALLERGIES:  is allergic to penicillins.  MEDICATIONS:  Current Outpatient Prescriptions  Medication Sig Dispense Refill  . Alum & Mag Hydroxide-Simeth (MAGIC MOUTHWASH W/LIDOCAINE) SOLN Take 5 mLs by mouth 4 (four) times daily as needed for mouth pain. 120 mL 0  .  Calcium Carbonate-Vit D-Min (CALCIUM 1200 PO) Take 1 tablet by mouth daily at 12 noon.     Marland Kitchen ibuprofen (ADVIL,MOTRIN) 200 MG tablet Take 200 mg by mouth every  4 (four) hours as needed for moderate pain.    Marland Kitchen lidocaine-prilocaine (EMLA) cream Apply to affected area once 30 g 3  . LORazepam (ATIVAN) 0.5 MG tablet TAKE 1 TABLET EVERY 6 HOURS AS NEEDED 30 tablet 0  . Multiple Vitamins-Minerals (MULTIVITAMIN PO) Take 1 tablet by mouth daily at 12 noon.     . ondansetron (ZOFRAN) 8 MG tablet Take 1 tablet (8 mg total) by mouth 2 (two) times daily. The day after chemo for 2 days, then every 8 hours PRN for N/V 30 tablet 1  . OVER THE COUNTER MEDICATION Take 4 tablets by mouth 2 (two) times daily. ([place])    . oxyCODONE-acetaminophen (PERCOCET/ROXICET) 5-325 MG per tablet 1-2 tabs every 6 hours as needed 30 tablet 0  . prochlorperazine (COMPAZINE) 10 MG tablet Take 1 tablet (10 mg total) by mouth every 6 (six) hours as needed (Nausea or vomiting). 30 tablet 1  . traMADol (ULTRAM) 50 MG tablet Take 50 mg by mouth every 6 (six) hours as needed for moderate pain.    Marland Kitchen UNABLE TO FIND E9381 Silicone Breast Prosthesis, Rt quantity 1 L8020 Mastectomy form, Rt, quantity 1 L8000 Mastectomy bra, quantity 6    . UNABLE TO FIND 174.9 Malignant Neoplasm   Mastectomy Right  L8030-Silicone Breast Prosthesis-1  L8000- Mastectomy Bra-2 1 each 0   No current facility-administered medications for this visit.   Facility-Administered Medications Ordered in Other Visits  Medication Dose Route Frequency Provider Last Rate Last Dose  . 0.9 %  sodium chloride infusion   Intravenous Once Nicholas Lose, MD      . eriBULin mesylate (HALAVEN) 2 mg in sodium chloride 0.9 % 100 mL chemo infusion  1.15 mg/m2 (Treatment Plan Actual) Intravenous Once Nicholas Lose, MD      . heparin lock flush 100 unit/mL  500 Units Intracatheter Once PRN Nicholas Lose, MD      . ondansetron (ZOFRAN) 8 mg, dexamethasone (DECADRON) 10 mg in sodium chloride 0.9 % 50 mL IVPB   Intravenous Once Nicholas Lose, MD      . sodium chloride 0.9 % injection 10 mL  10 mL Intracatheter PRN Nicholas Lose, MD         PHYSICAL EXAMINATION: ECOG PERFORMANCE STATUS: 0 - Asymptomatic  Filed Vitals:   12/13/14 1039  BP: 113/72  Pulse: 75  Temp: 98.4 F (36.9 C)  Resp: 18   Filed Weights   12/13/14 1039  Weight: 151 lb 9.6 oz (68.765 kg)    GENERAL:alert, no distress and comfortable SKIN: skin color, texture, turgor are normal, no rashes or significant lesions EYES: normal, Conjunctiva are pink and non-injected, sclera clear OROPHARYNX:no exudate, no erythema and lips, buccal mucosa, and tongue normal  NECK: supple, thyroid normal size, non-tender, without nodularity LYMPH:  no palpable lymphadenopathy in the cervical, axillary or inguinal LUNGS: clear to auscultation and percussion with normal breathing effort HEART: regular rate & rhythm and no murmurs and no lower extremity edema ABDOMEN:abdomen soft, non-tender and normal bowel sounds Musculoskeletal:no cyanosis of digits and no clubbing  NEURO: alert & oriented x 3 with fluent speech, no focal motor/sensory deficits  LABORATORY DATA:  I have reviewed the data as listed   Chemistry      Component Value Date/Time   NA 144 12/13/2014 1005  NA 141 08/30/2014 0953   K 4.0 12/13/2014 1005   K 4.1 08/30/2014 0953   CL 110 08/30/2014 0953   CL 106 10/23/2012 1459   CO2 25 12/13/2014 1005   CO2 24 08/30/2014 0953   BUN 16.5 12/13/2014 1005   BUN 9 08/30/2014 0953   CREATININE 0.7 12/13/2014 1005   CREATININE 0.67 08/30/2014 0953   CREATININE 0.82 08/18/2011 1056      Component Value Date/Time   CALCIUM 8.2* 12/13/2014 1005   CALCIUM 7.3* 08/30/2014 0953   ALKPHOS 101 12/13/2014 1005   ALKPHOS 414* 08/30/2014 0953   AST 23 12/13/2014 1005   AST 59* 08/30/2014 0953   ALT 16 12/13/2014 1005   ALT 38* 08/30/2014 0953   BILITOT 0.38 12/13/2014 1005   BILITOT 0.3 08/30/2014 0953       Lab Results  Component Value Date   WBC 2.9* 12/13/2014   HGB 11.4* 12/13/2014   HCT 35.4 12/13/2014   MCV 86.1 12/13/2014   PLT 198  12/13/2014   NEUTROABS 1.7 12/13/2014     RADIOGRAPHIC STUDIES: I have personally reviewed the radiology reports and agreed with their findings. 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 Treated with Ibrance plus letrozole plus X Geva since 03/12/2014. PET/CT 07/22/2014 revealed multiple lymph nodes, multiple liver lesions, adrenal lesions, widespread bone metastases, started Halaven 08/09/2014  Treatment plan: Halaven day 1 and day 8 every 3 weeks started 08/09/2014 Current treatment: Halaven cycle 6 day 1  Chemotherapy toxicities: 1. Elevated AST and ALT and alkaline phosphatase: Due to liver involvement. She will need to be followed up closely on her liver function tests. All normal today 2. Thrombocytopenia: Most likely related to bone marrow involvement by her breast cancer. Count improved to 180 today.  3. Neutropenia: This had improved with Neulasta injection but patient would like to withhold the Neulasta and see how she does without it so we will discontinue it with this cycle 4. Anemia: hemoglobin up to 9.2  PET/CT scan 12/06/2014: Monitor response to therapy with resolution of hypermetabolic nodal metastases within the neck and the chest, improvement to resolution of hypermetabolic liver metastases, resolved right and left adrenal metastases, near complete resolution of widespread bony metastases  I discussed the result with the patient and she was ecstatic to hear these results. We will plan to continue with Halaven maintenance chemotherapy as long as she  tolerates it and as long as she responds to it.   No orders of the defined types were placed in this encounter.   The patient has a good understanding of the overall plan. she agrees with it. she will call with any problems that may develop before the next visit here.   Rulon Eisenmenger, MD

## 2014-12-16 ENCOUNTER — Telehealth: Payer: Self-pay | Admitting: Hematology and Oncology

## 2014-12-16 ENCOUNTER — Other Ambulatory Visit: Payer: Self-pay

## 2014-12-16 NOTE — Telephone Encounter (Signed)
s.w. pt and advised on 8.5 appt.Marland KitchenMarland KitchenMarland KitchenMarland Kitchenpt ok and aware

## 2014-12-20 ENCOUNTER — Other Ambulatory Visit: Payer: Self-pay | Admitting: *Deleted

## 2014-12-20 ENCOUNTER — Ambulatory Visit (HOSPITAL_BASED_OUTPATIENT_CLINIC_OR_DEPARTMENT_OTHER): Payer: 59

## 2014-12-20 ENCOUNTER — Other Ambulatory Visit (HOSPITAL_BASED_OUTPATIENT_CLINIC_OR_DEPARTMENT_OTHER): Payer: 59

## 2014-12-20 VITALS — BP 128/71 | HR 72 | Temp 98.4°F | Resp 16

## 2014-12-20 DIAGNOSIS — C50912 Malignant neoplasm of unspecified site of left female breast: Secondary | ICD-10-CM | POA: Diagnosis not present

## 2014-12-20 DIAGNOSIS — Z5111 Encounter for antineoplastic chemotherapy: Secondary | ICD-10-CM | POA: Diagnosis not present

## 2014-12-20 DIAGNOSIS — C7951 Secondary malignant neoplasm of bone: Secondary | ICD-10-CM | POA: Diagnosis not present

## 2014-12-20 DIAGNOSIS — C50111 Malignant neoplasm of central portion of right female breast: Secondary | ICD-10-CM

## 2014-12-20 LAB — COMPREHENSIVE METABOLIC PANEL (CC13)
ALT: 22 U/L (ref 0–55)
AST: 22 U/L (ref 5–34)
Albumin: 3.7 g/dL (ref 3.5–5.0)
Alkaline Phosphatase: 93 U/L (ref 40–150)
Anion Gap: 5 mEq/L (ref 3–11)
BUN: 9.3 mg/dL (ref 7.0–26.0)
CO2: 27 meq/L (ref 22–29)
Calcium: 8.6 mg/dL (ref 8.4–10.4)
Chloride: 113 mEq/L — ABNORMAL HIGH (ref 98–109)
Creatinine: 0.7 mg/dL (ref 0.6–1.1)
EGFR: >90 mL/min/{1.73_m2} (ref >90)
Glucose: 107 mg/dl (ref 70–140)
POTASSIUM: 4.1 meq/L (ref 3.5–5.1)
Sodium: 145 mEq/L (ref 136–145)
TOTAL PROTEIN: 6.2 g/dL — AB (ref 6.4–8.3)
Total Bilirubin: 0.39 mg/dL (ref 0.20–1.20)

## 2014-12-20 LAB — CBC WITH DIFFERENTIAL/PLATELET
BASO%: 0.6 % (ref 0.0–2.0)
Basophils Absolute: 0 10*3/uL (ref 0.0–0.1)
EOS%: 0.9 % (ref 0.0–7.0)
Eosinophils Absolute: 0 10*3/uL (ref 0.0–0.5)
HCT: 35.5 % (ref 34.8–46.6)
HGB: 11.5 g/dL — ABNORMAL LOW (ref 11.6–15.9)
LYMPH%: 26.7 % (ref 14.0–49.7)
MCH: 28 pg (ref 25.1–34.0)
MCHC: 32.3 g/dL (ref 31.5–36.0)
MCV: 86.6 fL (ref 79.5–101.0)
MONO#: 0.2 10*3/uL (ref 0.1–0.9)
MONO%: 6 % (ref 0.0–14.0)
NEUT#: 2.2 10*3/uL (ref 1.5–6.5)
NEUT%: 65.8 % (ref 38.4–76.8)
Platelets: 196 10*3/uL (ref 145–400)
RBC: 4.09 10*6/uL (ref 3.70–5.45)
RDW: 18.5 % — AB (ref 11.2–14.5)
WBC: 3.4 10*3/uL — AB (ref 3.9–10.3)
lymph#: 0.9 10*3/uL (ref 0.9–3.3)

## 2014-12-20 MED ORDER — SODIUM CHLORIDE 0.9 % IV SOLN
Freq: Once | INTRAVENOUS | Status: AC
  Administered 2014-12-20: 12:00:00 via INTRAVENOUS
  Filled 2014-12-20: qty 4

## 2014-12-20 MED ORDER — SODIUM CHLORIDE 0.9 % IV SOLN
1.1500 mg/m2 | Freq: Once | INTRAVENOUS | Status: AC
  Administered 2014-12-20: 2 mg via INTRAVENOUS
  Filled 2014-12-20: qty 4

## 2014-12-20 MED ORDER — SODIUM CHLORIDE 0.9 % IV SOLN
Freq: Once | INTRAVENOUS | Status: AC
  Administered 2014-12-20: 12:00:00 via INTRAVENOUS

## 2014-12-20 MED ORDER — SODIUM CHLORIDE 0.9 % IJ SOLN
10.0000 mL | INTRAMUSCULAR | Status: DC | PRN
  Administered 2014-12-20: 10 mL
  Filled 2014-12-20: qty 10

## 2014-12-20 MED ORDER — HEPARIN SOD (PORK) LOCK FLUSH 100 UNIT/ML IV SOLN
500.0000 [IU] | Freq: Once | INTRAVENOUS | Status: AC | PRN
  Administered 2014-12-20: 500 [IU]
  Filled 2014-12-20: qty 5

## 2014-12-20 NOTE — Progress Notes (Signed)
Pt scheduled to receive halavan and xgeva today. Pt stated that Dr. Lindi Adie told her at the last visit that the xgeva was to be stopped. There was no indication from the last note that the xgeva was to be stopped. Dr. Lindi Adie is out of office today. Spoke with Dr. Alvy Bimler who stated to hold the xgeva for one week until Dr. Lindi Adie can address with pt. Pt informed and in agreement to delay xgeva until Dr. Lindi Adie can address.

## 2014-12-20 NOTE — Patient Instructions (Signed)
[facility] Discharge Instructions for Patients Receiving Chemotherapy  Today you received the following chemotherapy agents Halaven.  To help prevent nausea and vomiting after your treatment, we encourage you to take your nausea medication as prescribed.   If you develop nausea and vomiting that is not controlled by your nausea medication, call the clinic.   BELOW ARE SYMPTOMS THAT SHOULD BE REPORTED IMMEDIATELY:  *FEVER GREATER THAN 100.5 F  *CHILLS WITH OR WITHOUT FEVER  NAUSEA AND VOMITING THAT IS NOT CONTROLLED WITH YOUR NAUSEA MEDICATION  *UNUSUAL SHORTNESS OF BREATH  *UNUSUAL BRUISING OR BLEEDING  TENDERNESS IN MOUTH AND THROAT WITH OR WITHOUT PRESENCE OF ULCERS  *URINARY PROBLEMS  *BOWEL PROBLEMS  UNUSUAL RASH Items with * indicate a potential emergency and should be followed up as soon as possible.  Feel free to call the clinic you have any questions or concerns. The clinic phone number is [phone].  Please show the CHEMO ALERT CARD at check-in to the Emergency Department and triage nurse.   

## 2014-12-25 ENCOUNTER — Encounter: Payer: Self-pay | Admitting: Gastroenterology

## 2015-01-03 ENCOUNTER — Encounter: Payer: Self-pay | Admitting: Physician Assistant

## 2015-01-03 ENCOUNTER — Telehealth: Payer: Self-pay | Admitting: Hematology and Oncology

## 2015-01-03 ENCOUNTER — Other Ambulatory Visit (HOSPITAL_BASED_OUTPATIENT_CLINIC_OR_DEPARTMENT_OTHER): Payer: 59

## 2015-01-03 ENCOUNTER — Ambulatory Visit (HOSPITAL_BASED_OUTPATIENT_CLINIC_OR_DEPARTMENT_OTHER): Payer: 59 | Admitting: Physician Assistant

## 2015-01-03 ENCOUNTER — Ambulatory Visit (HOSPITAL_BASED_OUTPATIENT_CLINIC_OR_DEPARTMENT_OTHER): Payer: 59

## 2015-01-03 VITALS — BP 125/69 | HR 74 | Temp 98.8°F | Resp 18 | Ht 65.0 in | Wt 150.6 lb

## 2015-01-03 DIAGNOSIS — C7951 Secondary malignant neoplasm of bone: Secondary | ICD-10-CM | POA: Diagnosis not present

## 2015-01-03 DIAGNOSIS — D702 Other drug-induced agranulocytosis: Secondary | ICD-10-CM

## 2015-01-03 DIAGNOSIS — C50111 Malignant neoplasm of central portion of right female breast: Secondary | ICD-10-CM

## 2015-01-03 DIAGNOSIS — Z5111 Encounter for antineoplastic chemotherapy: Secondary | ICD-10-CM

## 2015-01-03 DIAGNOSIS — D6959 Other secondary thrombocytopenia: Secondary | ICD-10-CM

## 2015-01-03 DIAGNOSIS — C50919 Malignant neoplasm of unspecified site of unspecified female breast: Secondary | ICD-10-CM

## 2015-01-03 LAB — CBC WITH DIFFERENTIAL/PLATELET
BASO%: 0.7 % (ref 0.0–2.0)
BASOS ABS: 0 10*3/uL (ref 0.0–0.1)
EOS%: 0.7 % (ref 0.0–7.0)
Eosinophils Absolute: 0 10*3/uL (ref 0.0–0.5)
HCT: 37.9 % (ref 34.8–46.6)
HGB: 12.2 g/dL (ref 11.6–15.9)
LYMPH%: 30.6 % (ref 14.0–49.7)
MCH: 27.7 pg (ref 25.1–34.0)
MCHC: 32.2 g/dL (ref 31.5–36.0)
MCV: 86.1 fL (ref 79.5–101.0)
MONO#: 0.4 10*3/uL (ref 0.1–0.9)
MONO%: 14.9 % — AB (ref 0.0–14.0)
NEUT#: 1.5 10*3/uL (ref 1.5–6.5)
NEUT%: 53.1 % (ref 38.4–76.8)
Platelets: 183 10*3/uL (ref 145–400)
RBC: 4.4 10*6/uL (ref 3.70–5.45)
RDW: 16.4 % — ABNORMAL HIGH (ref 11.2–14.5)
WBC: 2.9 10*3/uL — ABNORMAL LOW (ref 3.9–10.3)
lymph#: 0.9 10*3/uL (ref 0.9–3.3)

## 2015-01-03 LAB — COMPREHENSIVE METABOLIC PANEL (CC13)
ALT: 23 U/L (ref 0–55)
AST: 25 U/L (ref 5–34)
Albumin: 3.9 g/dL (ref 3.5–5.0)
Alkaline Phosphatase: 108 U/L (ref 40–150)
Anion Gap: 10 mEq/L (ref 3–11)
BUN: 14.6 mg/dL (ref 7.0–26.0)
CALCIUM: 8.6 mg/dL (ref 8.4–10.4)
CHLORIDE: 109 meq/L (ref 98–109)
CO2: 25 mEq/L (ref 22–29)
Creatinine: 0.8 mg/dL (ref 0.6–1.1)
EGFR: 77 mL/min/{1.73_m2} — AB (ref >90)
Glucose: 89 mg/dl (ref 70–140)
Potassium: 4.2 mEq/L (ref 3.5–5.1)
Sodium: 144 mEq/L (ref 136–145)
Total Bilirubin: 0.41 mg/dL (ref 0.20–1.20)
Total Protein: 6.9 g/dL (ref 6.4–8.3)

## 2015-01-03 MED ORDER — DEXAMETHASONE SODIUM PHOSPHATE 100 MG/10ML IJ SOLN
Freq: Once | INTRAMUSCULAR | Status: AC
  Administered 2015-01-03: 12:00:00 via INTRAVENOUS
  Filled 2015-01-03: qty 4

## 2015-01-03 MED ORDER — SODIUM CHLORIDE 0.9 % IV SOLN
Freq: Once | INTRAVENOUS | Status: AC
  Administered 2015-01-03: 12:00:00 via INTRAVENOUS

## 2015-01-03 MED ORDER — SODIUM CHLORIDE 0.9 % IV SOLN
1.1500 mg/m2 | Freq: Once | INTRAVENOUS | Status: AC
  Administered 2015-01-03: 2 mg via INTRAVENOUS
  Filled 2015-01-03: qty 4

## 2015-01-03 MED ORDER — SODIUM CHLORIDE 0.9 % IJ SOLN
10.0000 mL | INTRAMUSCULAR | Status: DC | PRN
  Administered 2015-01-03: 10 mL
  Filled 2015-01-03: qty 10

## 2015-01-03 MED ORDER — HEPARIN SOD (PORK) LOCK FLUSH 100 UNIT/ML IV SOLN
500.0000 [IU] | Freq: Once | INTRAVENOUS | Status: AC | PRN
  Administered 2015-01-03: 500 [IU]
  Filled 2015-01-03: qty 5

## 2015-01-03 NOTE — Progress Notes (Signed)
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   11/09/2012 Surgery 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 one out of 13 lymph nodes positive ER 100%, PR 29%, HER-2 negative ratio 0.93 Ki-67 89%   09/26/2013 - 11/08/2013 Radiation Therapy Radiation to right chest wall and right supraclavicular region to a dose of 50.4 gray at 1.8 gray per fraction    Anti-estrogen oral therapy Patient refused antiestrogen therapy and chemotherapy, receives Blue crystal treatment from [place] (patch that sucks out impurities)   02/21/2014 PET scan Diffuse bone metastases involving the axial and appendicular skeleton, biopsy of the left iliac bone positive for metastatic breast cancer   03/12/2014 Treatment Plan Change Ibrance with letrozole and Xgeva    07/22/2014 PET scan New hypermetabolic lymph nodes in the neck and chest, new abnormalities in the liver without underlying CT imaging abnormality, both adrenal glands are hypermetabolic widespread bony metastases as before   08/09/2014 -  Chemotherapy Halaven day 1, day 8 every 3 weeks   10/04/2014 Initial Biopsy Bone marrow biopsy showing extensive infiltration of breast cancer in the bone marrow; CT chest abdomen and pelvis after 3 cycles of Halaven: reveals stable bone disease and stable liver lesions   12/06/2014 PET scan resolution of  nodal  metastases within the neck and the chest, Resolution of hypermetabolic liver metastases, resolved right and left adrenal metastases, near complete resolution of widespread bony metastases.    CHIEF COMPLIANT:  Cycle 8 Halaven  INTERVAL HISTORY: [name] is a 59 year old with above-mentioned history of metastatic breast cancer with extensive liver and bone and lymph node metastases in addition to adrenal metastases, she also had bone marrow involvement. She is status post 7 cycles of Halaven Her recent PET/CT scan revealed significant improvement in her disease. She reports occasional difficulty sleeping. She has used Advil or Tylenol PM with improvement. She notes a "nodule" in the posterior scalp region. She initially had 2 nodules but one has resolved. She has had hair loss associated with her chemotherapy. She has had remarkable clinical improvement in her symptoms, strength and energy levels.  REVIEW OF SYSTEMS:   Constitutional: Denies fevers, chills or abnormal weight loss Eyes: Denies blurriness of vision Ears, nose, mouth, throat, and face: Denies mucositis or sore throat Respiratory: Denies cough, dyspnea or wheezes Cardiovascular: Denies palpitation, chest discomfort or lower extremity swelling Gastrointestinal:  Denies nausea, heartburn or change in bowel habits Skin: Denies abnormal skin rashes Lymphatics: Denies new lymphadenopathy or easy bruising Neurological:Denies numbness, tingling or new weaknesses Behavioral/Psych: Mood is stable, no new changes  Skin: posterior scalp "nodule" All other systems were reviewed with the patient and are negative.  I have reviewed the past medical history, past surgical history, social history and family history with the patient and they are unchanged from previous note.  ALLERGIES:  is allergic to penicillins.  MEDICATIONS:  Current Outpatient Prescriptions  Medication Sig Dispense Refill  . Alum & Mag Hydroxide-Simeth (MAGIC  MOUTHWASH W/LIDOCAINE) SOLN Take 5 mLs by mouth 4 (four) times daily as needed for mouth pain. 120 mL 0  . Calcium Carbonate-Vit D-Min (CALCIUM 1200 PO) Take 1 tablet by mouth daily at 12 noon.     Marland Kitchen ibuprofen (ADVIL,MOTRIN) 200 MG tablet Take 200 mg by mouth every 4 (four) hours as needed for moderate pain.    Marland Kitchen lidocaine-prilocaine (EMLA) cream Apply to affected area once 30 g 3  . LORazepam (ATIVAN) 0.5 MG tablet TAKE 1 TABLET EVERY 6 HOURS AS NEEDED 30 tablet 0  . Multiple Vitamins-Minerals (MULTIVITAMIN PO) Take 1 tablet by mouth daily at 12 noon.     . ondansetron (ZOFRAN) 8 MG tablet Take 1 tablet (8 mg total) by mouth 2 (two) times daily. The day after chemo for 2 days, then every 8 hours PRN for N/V 30 tablet 1  . OVER THE COUNTER MEDICATION Take 4 tablets by mouth 2 (two) times daily. ([place])    . oxyCODONE-acetaminophen (PERCOCET/ROXICET) 5-325 MG per tablet 1-2 tabs every 6 hours as needed 30 tablet 0  . prochlorperazine (COMPAZINE) 10 MG tablet Take 1 tablet (10 mg total) by mouth every 6 (six) hours as needed (Nausea or vomiting). 30 tablet 1  . traMADol (ULTRAM) 50 MG tablet Take 50 mg by mouth every 6 (six) hours as needed for moderate pain.    Marland Kitchen UNABLE TO FIND E9381 Silicone Breast Prosthesis, Rt quantity 1 L8020 Mastectomy form, Rt, quantity 1 L8000 Mastectomy bra, quantity 6    . UNABLE TO FIND 174.9 Malignant Neoplasm   Mastectomy Right  L8030-Silicone Breast Prosthesis-1  L8000- Mastectomy Bra-2 1 each 0   No current facility-administered medications for this visit.   Facility-Administered Medications Ordered in Other Visits  Medication Dose Route Frequency Provider Last Rate Last Dose  . sodium chloride 0.9 % injection 10 mL  10 mL Intracatheter PRN Nicholas Lose, MD   10 mL at 01/03/15 1242    PHYSICAL EXAMINATION: ECOG PERFORMANCE STATUS: 0 - Asymptomatic  Filed Vitals:   01/03/15 1047  BP: 125/69  Pulse: 74  Temp: 98.8 F (37.1 C)  Resp: 18   Filed  Weights   01/03/15 1047  Weight: 150 lb 9.6 oz (68.312 kg)    GENERAL:alert, no distress and comfortable SKIN: skin color, texture, turgor are normal, no rashes or significant lesions, approximately 1 cm nodule right posterior scalp, minimal tenderness, no erythema EYES: normal, Conjunctiva are pink and non-injected, sclera clear OROPHARYNX:no exudate, no erythema and lips, buccal mucosa, and tongue normal  NECK: supple, thyroid normal size, non-tender, without nodularity LYMPH:  no palpable lymphadenopathy in the cervical, axillary or inguinal LUNGS: clear to auscultation and percussion with normal breathing effort HEART: regular rate & rhythm and no murmurs and no lower extremity edema ABDOMEN:abdomen soft, non-tender and normal bowel sounds Musculoskeletal:no cyanosis of digits and no clubbing  NEURO: alert & oriented x 3 with fluent speech, no focal motor/sensory deficits  LABORATORY DATA:  I have reviewed the data as listed   Chemistry      Component Value Date/Time   NA 144 01/03/2015 1019   NA 141 08/30/2014 0953   K 4.2 01/03/2015 1019   K 4.1 08/30/2014 0953   CL 110 08/30/2014 0953   CL 106 10/23/2012 1459   CO2 25 01/03/2015 1019   CO2 24 08/30/2014 0953   BUN 14.6 01/03/2015 1019  BUN 9 08/30/2014 0953   CREATININE 0.8 01/03/2015 1019   CREATININE 0.67 08/30/2014 0953   CREATININE 0.82 08/18/2011 1056      Component Value Date/Time   CALCIUM 8.6 01/03/2015 1019   CALCIUM 7.3* 08/30/2014 0953   ALKPHOS 108 01/03/2015 1019   ALKPHOS 414* 08/30/2014 0953   AST 25 01/03/2015 1019   AST 59* 08/30/2014 0953   ALT 23 01/03/2015 1019   ALT 38* 08/30/2014 0953   BILITOT 0.41 01/03/2015 1019   BILITOT 0.3 08/30/2014 0953       Lab Results  Component Value Date   WBC 2.9* 01/03/2015   HGB 12.2 01/03/2015   HCT 37.9 01/03/2015   MCV 86.1 01/03/2015   PLT 183 01/03/2015   NEUTROABS 1.5 01/03/2015     RADIOGRAPHIC STUDIES: I have personally reviewed the  radiology reports and agreed with their findings. 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 Treated with Ibrance plus letrozole plus X Geva since 03/12/2014. PET/CT 07/22/2014 revealed multiple lymph nodes, multiple liver lesions, adrenal lesions, widespread bone metastases, started Halaven 08/09/2014  Treatment plan: Halaven day 1 and day 8 every 3 weeks started 08/09/2014 Current treatment: Halaven cycle 8 day 1  Chemotherapy toxicities: 1. Elevated AST and ALT and alkaline phosphatase: Due to liver involvement. We will continue to follow her liver function tests closely. All normal today 2. Thrombocytopenia: Most likely related to bone marrow involvement by her breast cancer. Count improved to 180 today.  3. Neutropenia: Neulasta on hold per patient preference. We will continue to monitor her counts very closely. 4. Anemia: hemoglobin now 12.2 5. Posterior scalp nodule: possibly represents an inflamed lymph node secondary to scalp irritation from hair loss. She was advised to apply warm compresses and monitor closely  PET/CT scan 12/06/2014: Monitor response to therapy with resolution of hypermetabolic nodal metastases within the neck and the chest, improvement to resolution of hypermetabolic liver metastases, resolved right and left adrenal metastases, near complete resolution of widespread bony metastases  I discussed the result with the patient and she was ecstatic to hear these results. We will plan to continue with Halaven maintenance chemotherapy as long as she  tolerates it and as long as she responds to it. She will follow up in 3 weeks prior to the next cycle of Halaven for re-evaluation.  No orders of the defined types were placed in this encounter.   The patient has a good understanding of the overall plan. she agrees with it. she will call with any problems that may develop before the next visit here.   Carlton Adam, PA-C  01/03/2015

## 2015-01-03 NOTE — Telephone Encounter (Signed)
s.w. pt and advised on Aug and SEpt appt....pt ok and aware

## 2015-01-03 NOTE — Patient Instructions (Signed)
Cancer Center Discharge Instructions for Patients Receiving Chemotherapy  Today you received the following chemotherapy agents Halaven.  To help prevent nausea and vomiting after your treatment, we encourage you to take your nausea medication as prescribed.   If you develop nausea and vomiting that is not controlled by your nausea medication, call the clinic.   BELOW ARE SYMPTOMS THAT SHOULD BE REPORTED IMMEDIATELY:  *FEVER GREATER THAN 100.5 F  *CHILLS WITH OR WITHOUT FEVER  NAUSEA AND VOMITING THAT IS NOT CONTROLLED WITH YOUR NAUSEA MEDICATION  *UNUSUAL SHORTNESS OF BREATH  *UNUSUAL BRUISING OR BLEEDING  TENDERNESS IN MOUTH AND THROAT WITH OR WITHOUT PRESENCE OF ULCERS  *URINARY PROBLEMS  *BOWEL PROBLEMS  UNUSUAL RASH Items with * indicate a potential emergency and should be followed up as soon as possible.  Feel free to call the clinic you have any questions or concerns. The clinic phone number is [phone].  Please show the CHEMO ALERT CARD at check-in to the Emergency Department and triage nurse.   

## 2015-01-09 NOTE — Patient Instructions (Signed)
Follow up in 3 weeks, prior to your next cycle of Halaven Apply warm compresses to the posterior scalp nodule

## 2015-01-10 ENCOUNTER — Ambulatory Visit (HOSPITAL_BASED_OUTPATIENT_CLINIC_OR_DEPARTMENT_OTHER): Payer: 59

## 2015-01-10 ENCOUNTER — Other Ambulatory Visit (HOSPITAL_BASED_OUTPATIENT_CLINIC_OR_DEPARTMENT_OTHER): Payer: 59

## 2015-01-10 VITALS — BP 114/61 | HR 80 | Temp 98.9°F | Resp 18

## 2015-01-10 DIAGNOSIS — C7951 Secondary malignant neoplasm of bone: Secondary | ICD-10-CM | POA: Diagnosis not present

## 2015-01-10 DIAGNOSIS — Z5111 Encounter for antineoplastic chemotherapy: Secondary | ICD-10-CM | POA: Diagnosis not present

## 2015-01-10 DIAGNOSIS — C50111 Malignant neoplasm of central portion of right female breast: Secondary | ICD-10-CM

## 2015-01-10 LAB — COMPREHENSIVE METABOLIC PANEL (CC13)
ALT: 29 U/L (ref 0–55)
AST: 27 U/L (ref 5–34)
Albumin: 3.8 g/dL (ref 3.5–5.0)
Alkaline Phosphatase: 108 U/L (ref 40–150)
Anion Gap: 9 mEq/L (ref 3–11)
BUN: 15.8 mg/dL (ref 7.0–26.0)
CHLORIDE: 109 meq/L (ref 98–109)
CO2: 25 meq/L (ref 22–29)
CREATININE: 0.8 mg/dL (ref 0.6–1.1)
Calcium: 8.7 mg/dL (ref 8.4–10.4)
EGFR: 79 mL/min/{1.73_m2} — ABNORMAL LOW (ref >90)
GLUCOSE: 135 mg/dL (ref 70–140)
Potassium: 3.8 mEq/L (ref 3.5–5.1)
SODIUM: 143 meq/L (ref 136–145)
Total Bilirubin: 0.44 mg/dL (ref 0.20–1.20)
Total Protein: 6.6 g/dL (ref 6.4–8.3)

## 2015-01-10 LAB — CBC WITH DIFFERENTIAL/PLATELET
BASO%: 0.6 % (ref 0.0–2.0)
Basophils Absolute: 0 10*3/uL (ref 0.0–0.1)
EOS%: 0.9 % (ref 0.0–7.0)
Eosinophils Absolute: 0 10*3/uL (ref 0.0–0.5)
HEMATOCRIT: 36.3 % (ref 34.8–46.6)
HGB: 11.8 g/dL (ref 11.6–15.9)
LYMPH#: 1 10*3/uL (ref 0.9–3.3)
LYMPH%: 26.2 % (ref 14.0–49.7)
MCH: 27.4 pg (ref 25.1–34.0)
MCHC: 32.6 g/dL (ref 31.5–36.0)
MCV: 84 fL (ref 79.5–101.0)
MONO#: 0.2 10*3/uL (ref 0.1–0.9)
MONO%: 5.3 % (ref 0.0–14.0)
NEUT#: 2.6 10*3/uL (ref 1.5–6.5)
NEUT%: 67 % (ref 38.4–76.8)
Platelets: 196 10*3/uL (ref 145–400)
RBC: 4.32 10*6/uL (ref 3.70–5.45)
RDW: 18.1 % — ABNORMAL HIGH (ref 11.2–14.5)
WBC: 4 10*3/uL (ref 3.9–10.3)

## 2015-01-10 MED ORDER — HEPARIN SOD (PORK) LOCK FLUSH 100 UNIT/ML IV SOLN
500.0000 [IU] | Freq: Once | INTRAVENOUS | Status: AC | PRN
Start: 2015-01-10 — End: 2015-01-10
  Administered 2015-01-10: 500 [IU]
  Filled 2015-01-10: qty 5

## 2015-01-10 MED ORDER — SODIUM CHLORIDE 0.9 % IV SOLN
1.1500 mg/m2 | Freq: Once | INTRAVENOUS | Status: AC
  Administered 2015-01-10: 2 mg via INTRAVENOUS
  Filled 2015-01-10: qty 4

## 2015-01-10 MED ORDER — SODIUM CHLORIDE 0.9 % IV SOLN
Freq: Once | INTRAVENOUS | Status: AC
  Administered 2015-01-10: 12:00:00 via INTRAVENOUS
  Filled 2015-01-10: qty 4

## 2015-01-10 MED ORDER — SODIUM CHLORIDE 0.9 % IJ SOLN
10.0000 mL | INTRAMUSCULAR | Status: DC | PRN
  Administered 2015-01-10: 10 mL
  Filled 2015-01-10: qty 10

## 2015-01-10 MED ORDER — SODIUM CHLORIDE 0.9 % IV SOLN
Freq: Once | INTRAVENOUS | Status: AC
  Administered 2015-01-10: 12:00:00 via INTRAVENOUS

## 2015-01-10 NOTE — Patient Instructions (Signed)
Eribulin solution for injection What is this medicine? ERIBULIN is a chemotherapy drug. It is used to treat breast cancer. This medicine may be used for other purposes; ask your health care provider or pharmacist if you have questions. COMMON BRAND NAME(S): Halaven What should I tell my health care provider before I take this medicine? They need to know if you have any of these conditions: -heart disease -kidney disease -liver disease -low blood counts, like low white cell, platelet, or red cell counts -an unusual or allergic reaction to eribulin, other medicines, foods, dyes, or preservatives -pregnant or trying to get pregnant -breast-feeding How should I use this medicine? This medicine is for infusion into a vein. It is given by a health care professional in a hospital or clinic setting. Talk to your pediatrician regarding the use of this medicine in children. Special care may be needed. Overdosage: If you think you've taken too much of this medicine contact a poison control center or emergency room at once. Overdosage: If you think you have taken too much of this medicine contact a poison control center or emergency room at once. NOTE: This medicine is only for you. Do not share this medicine with others. What if I miss a dose? It is important not to miss your dose. Call your doctor or health care professional if you are unable to keep an appointment. What may interact with this medicine? Do not take this medicine with any of the following medications: -amiodarone -astemizole -arsenic trioxide -bepridil -bretylium -chloroquine -chlorpromazine -cisapride -clarithromycin -dextromethorphan,  quinidine -disopyramide -dofetilide -droperidol -dronedarone -erythromycin -grepafloxacin -halofantrine -haloperidol -ibutilide -levomethadyl -mesoridazine -methadone -pentamidine -procainamide -quinidine -pimozide -posaconazole -probucol -propafenone -saquinavir -sotalol -sparfloxacin -terfenadine -thioridazine -troleandomycin -ziprasidone This list may not describe all possible interactions. Give your health care provider a list of all the medicines, herbs, non-prescription drugs, or dietary supplements you use. Also tell them if you smoke, drink alcohol, or use illegal drugs. Some items may interact with your medicine. What should I watch for while using this medicine? Your condition will be monitored carefully while you are receiving this medicine. This drug may make you feel generally unwell. This is not uncommon, as chemotherapy can affect healthy cells as well as cancer cells. Report any side effects. Continue your course of treatment even though you feel ill unless your doctor tells you to stop. Call your doctor or health care professional for advice if you get a fever, chills or sore throat, or other symptoms of a cold or flu. Do not treat yourself. This drug decreases your body's ability to fight infections. Try to avoid being around people who are sick. This medicine may increase your risk to bruise or bleed. Call your doctor or health care professional if you notice any unusual bleeding. Be careful brushing and flossing your teeth or using a toothpick because you may get an infection or bleed more easily. If you have any dental work done, tell your dentist you are receiving this medicine. Avoid taking products that contain aspirin, acetaminophen, ibuprofen, naproxen, or ketoprofen unless instructed by your doctor. These medicines may hide a fever. Do not become pregnant while taking this medicine. Women should inform their doctor if they wish to become pregnant or think  they might be pregnant. There is a potential for serious side effects to an unborn child. Talk to your health care professional or pharmacist for more information. Do not breast-feed an infant while taking this medicine. What side effects may I notice from receiving this medicine?   Side effects that you should report to your doctor or health care professional as soon as possible: -allergic reactions like skin rash, itching or hives, swelling of the face, lips, or tongue -low blood counts - this medicine may decrease the number of white blood cells, red blood cells and platelets. You may be at increased risk for infections and bleeding. -signs of infection - fever or chills, cough, sore throat, pain or difficulty passing urine -signs of decreased platelets or bleeding - bruising, pinpoint red spots on the skin, black, tarry stools, blood in the urine -signs of decreased red blood cells - unusually weak or tired, fainting spells, lightheadedness -pain, tingling, numbness in the hands or feet Side effects that usually do not require medical attention (Report these to your doctor or health care professional if they continue or are bothersome.): -constipation -hair loss -headache -loss of appetite -muscle or joint pain -nausea, vomiting -stomach pain This list may not describe all possible side effects. Call your doctor for medical advice about side effects. You may report side effects to FDA at 1-800-FDA-1088. Where should I keep my medicine? This drug is given in a hospital or clinic and will not be stored at home. NOTE: This sheet is a summary. It may not cover all possible information. If you have questions about this medicine, talk to your doctor, pharmacist, or health care provider.  2015, [employer]. (2009-04-24 23:04:37)  

## 2015-01-23 ENCOUNTER — Other Ambulatory Visit: Payer: Self-pay

## 2015-01-24 ENCOUNTER — Ambulatory Visit: Payer: 59 | Admitting: Nurse Practitioner

## 2015-01-24 ENCOUNTER — Ambulatory Visit (HOSPITAL_BASED_OUTPATIENT_CLINIC_OR_DEPARTMENT_OTHER): Payer: 59 | Admitting: Hematology and Oncology

## 2015-01-24 ENCOUNTER — Ambulatory Visit (HOSPITAL_BASED_OUTPATIENT_CLINIC_OR_DEPARTMENT_OTHER): Payer: 59

## 2015-01-24 ENCOUNTER — Encounter: Payer: Self-pay | Admitting: Hematology and Oncology

## 2015-01-24 ENCOUNTER — Other Ambulatory Visit: Payer: Self-pay | Admitting: Hematology and Oncology

## 2015-01-24 ENCOUNTER — Telehealth: Payer: Self-pay | Admitting: Hematology and Oncology

## 2015-01-24 ENCOUNTER — Other Ambulatory Visit (HOSPITAL_BASED_OUTPATIENT_CLINIC_OR_DEPARTMENT_OTHER): Payer: 59

## 2015-01-24 VITALS — BP 117/71 | HR 75 | Temp 98.6°F | Resp 17 | Ht 65.0 in | Wt 150.4 lb

## 2015-01-24 DIAGNOSIS — C50919 Malignant neoplasm of unspecified site of unspecified female breast: Secondary | ICD-10-CM

## 2015-01-24 DIAGNOSIS — Z5111 Encounter for antineoplastic chemotherapy: Secondary | ICD-10-CM

## 2015-01-24 DIAGNOSIS — C50111 Malignant neoplasm of central portion of right female breast: Secondary | ICD-10-CM

## 2015-01-24 DIAGNOSIS — D696 Thrombocytopenia, unspecified: Secondary | ICD-10-CM | POA: Diagnosis not present

## 2015-01-24 DIAGNOSIS — D701 Agranulocytosis secondary to cancer chemotherapy: Secondary | ICD-10-CM

## 2015-01-24 DIAGNOSIS — C7951 Secondary malignant neoplasm of bone: Secondary | ICD-10-CM

## 2015-01-24 DIAGNOSIS — D6481 Anemia due to antineoplastic chemotherapy: Secondary | ICD-10-CM

## 2015-01-24 LAB — CBC WITH DIFFERENTIAL/PLATELET
BASO%: 0.9 % (ref 0.0–2.0)
BASOS ABS: 0 10*3/uL (ref 0.0–0.1)
EOS ABS: 0 10*3/uL (ref 0.0–0.5)
EOS%: 1.1 % (ref 0.0–7.0)
HEMATOCRIT: 36.4 % (ref 34.8–46.6)
HEMOGLOBIN: 11.9 g/dL (ref 11.6–15.9)
LYMPH#: 1 10*3/uL (ref 0.9–3.3)
LYMPH%: 27.9 % (ref 14.0–49.7)
MCH: 27.3 pg (ref 25.1–34.0)
MCHC: 32.7 g/dL (ref 31.5–36.0)
MCV: 83.6 fL (ref 79.5–101.0)
MONO#: 0.5 10*3/uL (ref 0.1–0.9)
MONO%: 13.3 % (ref 0.0–14.0)
NEUT%: 56.8 % (ref 38.4–76.8)
NEUTROS ABS: 2 10*3/uL (ref 1.5–6.5)
Platelets: 234 10*3/uL (ref 145–400)
RBC: 4.35 10*6/uL (ref 3.70–5.45)
RDW: 18.5 % — AB (ref 11.2–14.5)
WBC: 3.6 10*3/uL — AB (ref 3.9–10.3)

## 2015-01-24 LAB — COMPREHENSIVE METABOLIC PANEL (CC13)
ALBUMIN: 3.8 g/dL (ref 3.5–5.0)
ALK PHOS: 146 U/L (ref 40–150)
ALT: 32 U/L (ref 0–55)
AST: 31 U/L (ref 5–34)
Anion Gap: 9 mEq/L (ref 3–11)
BILIRUBIN TOTAL: 0.64 mg/dL (ref 0.20–1.20)
BUN: 16.7 mg/dL (ref 7.0–26.0)
CALCIUM: 9.3 mg/dL (ref 8.4–10.4)
CO2: 26 mEq/L (ref 22–29)
CREATININE: 0.9 mg/dL (ref 0.6–1.1)
Chloride: 107 mEq/L (ref 98–109)
EGFR: 73 mL/min/{1.73_m2} — ABNORMAL LOW (ref >90)
Glucose: 91 mg/dl (ref 70–140)
POTASSIUM: 4.1 meq/L (ref 3.5–5.1)
Sodium: 142 mEq/L (ref 136–145)
TOTAL PROTEIN: 7.1 g/dL (ref 6.4–8.3)

## 2015-01-24 MED ORDER — SODIUM CHLORIDE 0.9 % IV SOLN
1.1500 mg/m2 | Freq: Once | INTRAVENOUS | Status: AC
  Administered 2015-01-24: 2 mg via INTRAVENOUS
  Filled 2015-01-24: qty 4

## 2015-01-24 MED ORDER — HEPARIN SOD (PORK) LOCK FLUSH 100 UNIT/ML IV SOLN
500.0000 [IU] | Freq: Once | INTRAVENOUS | Status: AC | PRN
  Administered 2015-01-24: 500 [IU]
  Filled 2015-01-24: qty 5

## 2015-01-24 MED ORDER — SODIUM CHLORIDE 0.9 % IJ SOLN
10.0000 mL | INTRAMUSCULAR | Status: DC | PRN
  Administered 2015-01-24: 10 mL
  Filled 2015-01-24: qty 10

## 2015-01-24 MED ORDER — DENOSUMAB 120 MG/1.7ML ~~LOC~~ SOLN
120.0000 mg | Freq: Once | SUBCUTANEOUS | Status: AC
  Administered 2015-01-24: 120 mg via SUBCUTANEOUS
  Filled 2015-01-24: qty 1.7

## 2015-01-24 MED ORDER — SODIUM CHLORIDE 0.9 % IV SOLN
Freq: Once | INTRAVENOUS | Status: AC
  Administered 2015-01-24: 11:00:00 via INTRAVENOUS
  Filled 2015-01-24: qty 4

## 2015-01-24 MED ORDER — SODIUM CHLORIDE 0.9 % IV SOLN
Freq: Once | INTRAVENOUS | Status: AC
  Administered 2015-01-24: 10:00:00 via INTRAVENOUS

## 2015-01-24 NOTE — Telephone Encounter (Signed)
Gave avs & calendar for September/October. °

## 2015-01-24 NOTE — Assessment & Plan Note (Addendum)
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 Treated with Ibrance plus letrozole plus X Geva since 03/12/2014. PET/CT 07/22/2014 revealed multiple lymph nodes, multiple liver lesions, adrenal lesions, widespread bone metastases, started Halaven 08/09/2014  Treatment plan: Halaven day 1 and day 8 every 3 weeks started 08/09/2014 Current treatment: Halaven cycle 9 day 1  Chemotherapy toxicities: 1. Elevated AST and ALT and alkaline phosphatase: Due to liver involvement. We will continue to follow her liver function tests closely. All normal today 2. Thrombocytopenia: Most likely related to bone marrow involvement by her breast cancer. Count improved to 180 today.  3. Neutropenia: Neulasta on hold per patient preference. We will continue to monitor her counts very closely. 4. Anemia: hemoglobin now 12.2 5. Posterior scalp nodule: possibly represents an inflamed lymph node secondary to scalp irritation from hair loss. She was advised to apply warm compresses and monitor closely  PET/CT scan 12/06/2014: Monitor response to therapy with resolution of hypermetabolic nodal metastases within the neck and the chest, improvement to resolution of hypermetabolic liver metastases, resolved right and left adrenal metastases, near complete resolution of widespread bony metastases.  Return to clinic in 3 weeks. We plan to do CTs and bone scan in October 2016

## 2015-01-24 NOTE — Addendum Note (Signed)
Addended by: Jonelle Sports K on: 01/24/2015 11:09 AM   Modules accepted: Medications

## 2015-01-24 NOTE — Patient Instructions (Signed)
Eribulin solution for injection What is this medicine? ERIBULIN is a chemotherapy drug. It is used to treat breast cancer. This medicine may be used for other purposes; ask your health care provider or pharmacist if you have questions. COMMON BRAND NAME(S): Halaven What should I tell my health care provider before I take this medicine? They need to know if you have any of these conditions: -heart disease -kidney disease -liver disease -low blood counts, like low white cell, platelet, or red cell counts -an unusual or allergic reaction to eribulin, other medicines, foods, dyes, or preservatives -pregnant or trying to get pregnant -breast-feeding How should I use this medicine? This medicine is for infusion into a vein. It is given by a health care professional in a hospital or clinic setting. Talk to your pediatrician regarding the use of this medicine in children. Special care may be needed. Overdosage: If you think you've taken too much of this medicine contact a poison control center or emergency room at once. Overdosage: If you think you have taken too much of this medicine contact a poison control center or emergency room at once. NOTE: This medicine is only for you. Do not share this medicine with others. What if I miss a dose? It is important not to miss your dose. Call your doctor or health care professional if you are unable to keep an appointment. What may interact with this medicine? Do not take this medicine with any of the following medications: -amiodarone -astemizole -arsenic trioxide -bepridil -bretylium -chloroquine -chlorpromazine -cisapride -clarithromycin -dextromethorphan,  quinidine -disopyramide -dofetilide -droperidol -dronedarone -erythromycin -grepafloxacin -halofantrine -haloperidol -ibutilide -levomethadyl -mesoridazine -methadone -pentamidine -procainamide -quinidine -pimozide -posaconazole -probucol -propafenone -saquinavir -sotalol -sparfloxacin -terfenadine -thioridazine -troleandomycin -ziprasidone This list may not describe all possible interactions. Give your health care provider a list of all the medicines, herbs, non-prescription drugs, or dietary supplements you use. Also tell them if you smoke, drink alcohol, or use illegal drugs. Some items may interact with your medicine. What should I watch for while using this medicine? Your condition will be monitored carefully while you are receiving this medicine. This drug may make you feel generally unwell. This is not uncommon, as chemotherapy can affect healthy cells as well as cancer cells. Report any side effects. Continue your course of treatment even though you feel ill unless your doctor tells you to stop. Call your doctor or health care professional for advice if you get a fever, chills or sore throat, or other symptoms of a cold or flu. Do not treat yourself. This drug decreases your body's ability to fight infections. Try to avoid being around people who are sick. This medicine may increase your risk to bruise or bleed. Call your doctor or health care professional if you notice any unusual bleeding. Be careful brushing and flossing your teeth or using a toothpick because you may get an infection or bleed more easily. If you have any dental work done, tell your dentist you are receiving this medicine. Avoid taking products that contain aspirin, acetaminophen, ibuprofen, naproxen, or ketoprofen unless instructed by your doctor. These medicines may hide a fever. Do not become pregnant while taking this medicine. Women should inform their doctor if they wish to become pregnant or think  they might be pregnant. There is a potential for serious side effects to an unborn child. Talk to your health care professional or pharmacist for more information. Do not breast-feed an infant while taking this medicine. What side effects may I notice from receiving this medicine?  Side effects that you should report to your doctor or health care professional as soon as possible: -allergic reactions like skin rash, itching or hives, swelling of the face, lips, or tongue -low blood counts - this medicine may decrease the number of white blood cells, red blood cells and platelets. You may be at increased risk for infections and bleeding. -signs of infection - fever or chills, cough, sore throat, pain or difficulty passing urine -signs of decreased platelets or bleeding - bruising, pinpoint red spots on the skin, black, tarry stools, blood in the urine -signs of decreased red blood cells - unusually weak or tired, fainting spells, lightheadedness -pain, tingling, numbness in the hands or feet Side effects that usually do not require medical attention (Report these to your doctor or health care professional if they continue or are bothersome.): -constipation -hair loss -headache -loss of appetite -muscle or joint pain -nausea, vomiting -stomach pain This list may not describe all possible side effects. Call your doctor for medical advice about side effects. You may report side effects to FDA at 1-800-FDA-1088. Where should I keep my medicine? This drug is given in a hospital or clinic and will not be stored at home. NOTE: This sheet is a summary. It may not cover all possible information. If you have questions about this medicine, talk to your doctor, pharmacist, or health care provider.  2015, Elsevier/Gold Standard. (2009-04-24 23:04:37) Denosumab injection What is this medicine? DENOSUMAB (den oh sue mab) slows bone breakdown. Prolia is used to treat osteoporosis in women after menopause and  in men. Delton See is used to prevent bone fractures and other bone problems caused by cancer bone metastases. Delton See is also used to treat giant cell tumor of the bone. This medicine may be used for other purposes; ask your health care provider or pharmacist if you have questions. COMMON BRAND NAME(S): Prolia, XGEVA What should I tell my health care provider before I take this medicine? They need to know if you have any of these conditions: -dental disease -eczema -infection or history of infections -kidney disease or on dialysis -low blood calcium or vitamin D -malabsorption syndrome -scheduled to have surgery or tooth extraction -taking medicine that contains denosumab -thyroid or parathyroid disease -an unusual reaction to denosumab, other medicines, foods, dyes, or preservatives -pregnant or trying to get pregnant -breast-feeding How should I use this medicine? This medicine is for injection under the skin. It is given by a health care professional in a hospital or clinic setting. If you are getting Prolia, a special MedGuide will be given to you by the pharmacist with each prescription and refill. Be sure to read this information carefully each time. For Prolia, talk to your pediatrician regarding the use of this medicine in children. Special care may be needed. For Delton See, talk to your pediatrician regarding the use of this medicine in children. While this drug may be prescribed for children as young as 13 years for selected conditions, precautions do apply. Overdosage: If you think you've taken too much of this medicine contact a poison control center or emergency room at once. Overdosage: If you think you have taken too much of this medicine contact a poison control center or emergency room at once. NOTE: This medicine is only for you. Do not share this medicine with others. What if I miss a dose? It is important not to miss your dose. Call your doctor or health care professional if you  are unable to keep an appointment. What may  interact with this medicine? Do not take this medicine with any of the following medications: -other medicines containing denosumab This medicine may also interact with the following medications: -medicines that suppress the immune system -medicines that treat cancer -steroid medicines like prednisone or cortisone This list may not describe all possible interactions. Give your health care provider a list of all the medicines, herbs, non-prescription drugs, or dietary supplements you use. Also tell them if you smoke, drink alcohol, or use illegal drugs. Some items may interact with your medicine. What should I watch for while using this medicine? Visit your doctor or health care professional for regular checks on your progress. Your doctor or health care professional may order blood tests and other tests to see how you are doing. Call your doctor or health care professional if you get a cold or other infection while receiving this medicine. Do not treat yourself. This medicine may decrease your body's ability to fight infection. You should make sure you get enough calcium and vitamin D while you are taking this medicine, unless your doctor tells you not to. Discuss the foods you eat and the vitamins you take with your health care professional. See your dentist regularly. Brush and floss your teeth as directed. Before you have any dental work done, tell your dentist you are receiving this medicine. Do not become pregnant while taking this medicine or for 5 months after stopping it. Women should inform their doctor if they wish to become pregnant or think they might be pregnant. There is a potential for serious side effects to an unborn child. Talk to your health care professional or pharmacist for more information. What side effects may I notice from receiving this medicine? Side effects that you should report to your doctor or health care professional as  soon as possible: -allergic reactions like skin rash, itching or hives, swelling of the face, lips, or tongue -breathing problems -chest pain -fast, irregular heartbeat -feeling faint or lightheaded, falls -fever, chills, or any other sign of infection -muscle spasms, tightening, or twitches -numbness or tingling -skin blisters or bumps, or is dry, peels, or red -slow healing or unexplained pain in the mouth or jaw -unusual bleeding or bruising Side effects that usually do not require medical attention (Report these to your doctor or health care professional if they continue or are bothersome.): -muscle pain -stomach upset, gas This list may not describe all possible side effects. Call your doctor for medical advice about side effects. You may report side effects to FDA at 1-800-FDA-1088. Where should I keep my medicine? This medicine is only given in a clinic, doctor's office, or other health care setting and will not be stored at home. NOTE: This sheet is a summary. It may not cover all possible information. If you have questions about this medicine, talk to your doctor, pharmacist, or health care provider.  2015, Elsevier/Gold Standard. (2011-11-01 12:37:47)

## 2015-01-24 NOTE — Progress Notes (Signed)
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   11/09/2012 Surgery 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 one out of 13 lymph nodes positive ER 100%, PR 29%, HER-2 negative ratio 0.93 Ki-67 89%   09/26/2013 - 11/08/2013 Radiation Therapy Radiation to right chest wall and right supraclavicular region to a dose of 50.4 gray at 1.8 gray per fraction    Anti-estrogen oral therapy Patient refused antiestrogen therapy and chemotherapy, receives Blue crystal treatment from [place] (patch that sucks out impurities)   02/21/2014 PET scan Diffuse bone metastases involving the axial and appendicular skeleton, biopsy of the left iliac bone positive for metastatic breast cancer   03/12/2014 Treatment Plan Change Ibrance with letrozole and Xgeva    07/22/2014 PET scan New hypermetabolic lymph nodes in the neck and chest, new abnormalities in the liver without underlying CT imaging abnormality, both adrenal glands are hypermetabolic widespread bony metastases as before   08/09/2014 -  Chemotherapy Halaven day 1, day 8 every 3 weeks   10/04/2014 Initial Biopsy Bone marrow biopsy showing extensive infiltration of breast cancer in the bone marrow; CT chest abdomen and pelvis after 3 cycles of Halaven: reveals stable bone disease and stable liver lesions   12/06/2014 PET scan resolution of  nodal  metastases within the neck and the chest, Resolution of hypermetabolic liver metastases, resolved right and left adrenal metastases, near complete resolution of widespread bony metastases.    CHIEF COMPLIANT: cycle 9 Halaven  INTERVAL HISTORY: [name] is a 59 year old with above-mentioned history metastatic breast cancer who responded very well to palliative chemotherapy with Halaven. She developed subcutaneous lesions over the past couple weeks some on the scalp and new ones are popping up on her shots shoulder and chest wall. These do not seem to bother her. In fact they wax and wane. They're not tender.  REVIEW OF SYSTEMS:   Constitutional: Denies fevers, chills or abnormal weight loss Eyes: Denies blurriness of vision Ears, nose, mouth, throat, and face: Denies mucositis or sore throat Respiratory: Denies cough, dyspnea or wheezes Cardiovascular: Denies palpitation, chest discomfort or lower extremity swelling Gastrointestinal:  Denies nausea, heartburn or change in bowel habits Skin: subcutaneous lesions on the scalp chest wall and shoulder suspicious for subcutaneous metastases Lymphatics: Denies new lymphadenopathy or easy bruising Neurological:Denies numbness, tingling or new weaknesses Behavioral/Psych: Mood is stable, no new changes  Breast:  denies any pain or lumps or nodules in either breasts All other systems were reviewed with the patient and are negative.  I have reviewed the past medical history, past surgical history, social history and family history with the patient and they are unchanged from previous note.  ALLERGIES:  is allergic to penicillins.  MEDICATIONS:  Current Outpatient Prescriptions  Medication Sig Dispense Refill  . Alum & Mag Hydroxide-Simeth (MAGIC MOUTHWASH W/LIDOCAINE) SOLN Take 5 mLs by mouth  4 (four) times daily as needed for mouth pain. 120 mL 0  . Calcium Carbonate-Vit D-Min (CALCIUM 1200 PO) Take 1 tablet by mouth daily at 12 noon.      Marland Kitchen ibuprofen (ADVIL,MOTRIN) 200 MG tablet Take 200 mg by mouth every 4 (four) hours as needed for moderate pain.    Marland Kitchen lidocaine-prilocaine (EMLA) cream Apply to affected area once 30 g 3  . LORazepam (ATIVAN) 0.5 MG tablet TAKE 1 TABLET EVERY 6 HOURS AS NEEDED 30 tablet 0  . Multiple Vitamins-Minerals (MULTIVITAMIN PO) Take 1 tablet by mouth daily at 12 noon.     . ondansetron (ZOFRAN) 8 MG tablet Take 1 tablet (8 mg total) by mouth 2 (two) times daily. The day after chemo for 2 days, then every 8 hours PRN for N/V 30 tablet 1  . OVER THE COUNTER MEDICATION Take 4 tablets by mouth 2 (two) times daily. ([place])    . oxyCODONE-acetaminophen (PERCOCET/ROXICET) 5-325 MG per tablet 1-2 tabs every 6 hours as needed 30 tablet 0  . prochlorperazine (COMPAZINE) 10 MG tablet Take 1 tablet (10 mg total) by mouth every 6 (six) hours as needed (Nausea or vomiting). 30 tablet 1  . traMADol (ULTRAM) 50 MG tablet Take 50 mg by mouth every 6 (six) hours as needed for moderate pain.    Marland Kitchen UNABLE TO FIND Q7622 Silicone Breast Prosthesis, Rt quantity 1 L8020 Mastectomy form, Rt, quantity 1 L8000 Mastectomy bra, quantity 6    . UNABLE TO FIND 174.9 Malignant Neoplasm   Mastectomy Right  L8030-Silicone Breast Prosthesis-1  L8000- Mastectomy Bra-2 1 each 0   No current facility-administered medications for this visit.    PHYSICAL EXAMINATION: ECOG PERFORMANCE STATUS: 1 - Symptomatic but completely ambulatory  Filed Vitals:   01/24/15 0922  BP: 117/71  Pulse: 75  Temp: 98.6 F (37 C)  Resp: 17   Filed Weights   01/24/15 0922  Weight: 150 lb 6.4 oz (68.221 kg)    GENERAL:alert, no distress and comfortable SKIN: skin color, texture, turgor are normal, no rashes or significant lesions EYES: normal, Conjunctiva are pink and non-injected, sclera clear OROPHARYNX:no exudate, no erythema and lips, buccal mucosa, and tongue normal  NECK: supple, thyroid normal size, non-tender, without  nodularity LYMPH:  no palpable lymphadenopathy in the cervical, axillary or inguinal LUNGS: clear to auscultation and percussion with normal breathing effort HEART: regular rate & rhythm and no murmurs and no lower extremity edema ABDOMEN:abdomen soft, non-tender and normal bowel sounds Musculoskeletal:no cyanosis of digits and no clubbing  NEURO: alert & oriented x 3 with fluent speech, no focal motor/sensory deficits  LABORATORY DATA:  I have reviewed the data as listed   Chemistry      Component Value Date/Time   NA 142 01/24/2015 0908   NA 141 08/30/2014 0953   K 4.1 01/24/2015 0908   K 4.1 08/30/2014 0953   CL 110 08/30/2014 0953   CL 106 10/23/2012 1459   CO2 26 01/24/2015 0908   CO2 24 08/30/2014 0953   BUN 16.7 01/24/2015 0908   BUN 9 08/30/2014 0953   CREATININE 0.9 01/24/2015 0908   CREATININE 0.67 08/30/2014 0953   CREATININE 0.82 08/18/2011 1056      Component Value Date/Time   CALCIUM 9.3 01/24/2015 0908   CALCIUM 7.3* 08/30/2014 0953   ALKPHOS 146 01/24/2015 0908   ALKPHOS 414* 08/30/2014 0953   AST 31 01/24/2015 0908   AST 59* 08/30/2014 0953   ALT 32 01/24/2015 0908  ALT 38* 08/30/2014 0953   BILITOT 0.64 01/24/2015 0908   BILITOT 0.3 08/30/2014 0953       Lab Results  Component Value Date   WBC 3.6* 01/24/2015   HGB 11.9 01/24/2015   HCT 36.4 01/24/2015   MCV 83.6 01/24/2015   PLT 234 01/24/2015   NEUTROABS 2.0 01/24/2015   ASSESSMENT & PLAN:  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 Treated with Ibrance plus letrozole plus X Geva since 03/12/2014. PET/CT 07/22/2014 revealed multiple lymph nodes, multiple liver lesions, adrenal lesions,  widespread bone metastases, started Halaven 08/09/2014  Treatment plan: Halaven day 1 and day 8 every 3 weeks started 08/09/2014, change to day 1 day 15 every 4 weeks from 01/24/2015 with cycle 9 Current treatment: Halaven cycle 9 day 1  Chemotherapy toxicities: 1. Elevated AST and ALT and alkaline phosphatase: Due to liver involvement. We will continue to follow her liver function tests closely.  2. Thrombocytopenia: Most likely related to bone marrow involvement by her breast cancer. Count improved to 234 today.  3. Neutropenia: Neulasta on hold per patient preference. We will continue to monitor her counts very closely. 4. Anemia: hemoglobin now 11.9 5. Posterior scalp nodule: suspicious for metastatic deposit. We will get Dr. Dalbert Batman to biopsy one of these cutaneous lesions  PET/CT scan 12/06/2014: Monitor response to therapy with resolution of hypermetabolic nodal metastases within the neck and the chest, improvement to resolution of hypermetabolic liver metastases, resolved right and left adrenal metastases, near complete resolution of widespread bony metastases.  Treatment plan change: I had lengthy discussion with her about our treatment plan and patient would like to receive this treatment every other week.  Cutaneous lesions: Highly suspicious for subcutaneous metastases. We would like to request Dr. Dalbert Batman to biopsy one of these lesions to prove that they might have metastatic breast cancer.  Return to clinic in 4 weeks. We plan to do CTs and bone scan in end of September 2016   Orders Placed This Encounter  Procedures  . CT Abdomen Pelvis Wo Contrast    Standing Status: Future     Number of Occurrences:      Standing Expiration Date: 01/24/2016    Order Specific Question:  Reason for Exam (SYMPTOM  OR DIAGNOSIS REQUIRED)    Answer:  Metastatic breast cancer restaging    Order Specific Question:  Preferred imaging location?    Answer:  [facility]  . CT Chest W  Contrast    Standing Status: Future     Number of Occurrences:      Standing Expiration Date: 01/24/2016    Order Specific Question:  Reason for Exam (SYMPTOM  OR DIAGNOSIS REQUIRED)    Answer:  Metastatic breast cancer restaging    Order Specific Question:  Preferred imaging location?    Answer:  [facility]    Standing Status: Future     Number of Occurrences:      Standing Expiration Date: 01/24/2016    Order Specific Question:  Reason for Exam (SYMPTOM  OR DIAGNOSIS REQUIRED)    Answer:  Metastatic breast cancer restaging    Order Specific Question:  Preferred imaging location?    Answer:  [facility]   The  patient has a good understanding of the overall plan. she agrees with it. she will call with any problems that may develop before the next visit here.   Rulon Eisenmenger, MD

## 2015-01-27 ENCOUNTER — Other Ambulatory Visit: Payer: Self-pay | Admitting: General Surgery

## 2015-01-31 ENCOUNTER — Other Ambulatory Visit: Payer: 59

## 2015-01-31 ENCOUNTER — Ambulatory Visit: Payer: 59

## 2015-02-07 ENCOUNTER — Ambulatory Visit (HOSPITAL_BASED_OUTPATIENT_CLINIC_OR_DEPARTMENT_OTHER): Payer: 59

## 2015-02-07 ENCOUNTER — Other Ambulatory Visit (HOSPITAL_BASED_OUTPATIENT_CLINIC_OR_DEPARTMENT_OTHER): Payer: 59

## 2015-02-07 VITALS — BP 131/72 | HR 81 | Temp 97.9°F | Resp 18

## 2015-02-07 DIAGNOSIS — C50111 Malignant neoplasm of central portion of right female breast: Secondary | ICD-10-CM

## 2015-02-07 DIAGNOSIS — Z5111 Encounter for antineoplastic chemotherapy: Secondary | ICD-10-CM | POA: Diagnosis not present

## 2015-02-07 DIAGNOSIS — C7951 Secondary malignant neoplasm of bone: Secondary | ICD-10-CM

## 2015-02-07 LAB — COMPREHENSIVE METABOLIC PANEL (CC13)
ALT: 42 U/L (ref 0–55)
ANION GAP: 11 meq/L (ref 3–11)
AST: 43 U/L — ABNORMAL HIGH (ref 5–34)
Albumin: 3.6 g/dL (ref 3.5–5.0)
Alkaline Phosphatase: 213 U/L — ABNORMAL HIGH (ref 40–150)
BUN: 18.2 mg/dL (ref 7.0–26.0)
CALCIUM: 9.4 mg/dL (ref 8.4–10.4)
CHLORIDE: 107 meq/L (ref 98–109)
CO2: 28 mEq/L (ref 22–29)
CREATININE: 0.9 mg/dL (ref 0.6–1.1)
EGFR: 72 mL/min/{1.73_m2} — AB (ref >90)
Glucose: 110 mg/dl (ref 70–140)
Potassium: 4 mEq/L (ref 3.5–5.1)
Sodium: 145 mEq/L (ref 136–145)
Total Bilirubin: 0.41 mg/dL (ref 0.20–1.20)
Total Protein: 7.1 g/dL (ref 6.4–8.3)

## 2015-02-07 LAB — CBC WITH DIFFERENTIAL/PLATELET
BASO%: 1.1 % (ref 0.0–2.0)
BASOS ABS: 0 10*3/uL (ref 0.0–0.1)
EOS ABS: 0.1 10*3/uL (ref 0.0–0.5)
EOS%: 1.7 % (ref 0.0–7.0)
HEMATOCRIT: 36.2 % (ref 34.8–46.6)
HGB: 11.6 g/dL (ref 11.6–15.9)
LYMPH#: 1 10*3/uL (ref 0.9–3.3)
LYMPH%: 26.4 % (ref 14.0–49.7)
MCH: 26.6 pg (ref 25.1–34.0)
MCHC: 32.1 g/dL (ref 31.5–36.0)
MCV: 82.8 fL (ref 79.5–101.0)
MONO#: 0.4 10*3/uL (ref 0.1–0.9)
MONO%: 10.9 % (ref 0.0–14.0)
NEUT#: 2.4 10*3/uL (ref 1.5–6.5)
NEUT%: 59.9 % (ref 38.4–76.8)
PLATELETS: 278 10*3/uL (ref 145–400)
RBC: 4.37 10*6/uL (ref 3.70–5.45)
RDW: 18.1 % — ABNORMAL HIGH (ref 11.2–14.5)
WBC: 3.9 10*3/uL (ref 3.9–10.3)

## 2015-02-07 MED ORDER — SODIUM CHLORIDE 0.9 % IV SOLN
Freq: Once | INTRAVENOUS | Status: AC
  Administered 2015-02-07: 12:00:00 via INTRAVENOUS
  Filled 2015-02-07: qty 4

## 2015-02-07 MED ORDER — SODIUM CHLORIDE 0.9 % IJ SOLN
10.0000 mL | INTRAMUSCULAR | Status: DC | PRN
  Administered 2015-02-07: 10 mL
  Filled 2015-02-07: qty 10

## 2015-02-07 MED ORDER — SODIUM CHLORIDE 0.9 % IV SOLN
Freq: Once | INTRAVENOUS | Status: AC
  Administered 2015-02-07: 11:00:00 via INTRAVENOUS

## 2015-02-07 MED ORDER — HEPARIN SOD (PORK) LOCK FLUSH 100 UNIT/ML IV SOLN
500.0000 [IU] | Freq: Once | INTRAVENOUS | Status: AC | PRN
  Administered 2015-02-07: 500 [IU]
  Filled 2015-02-07: qty 5

## 2015-02-07 MED ORDER — SODIUM CHLORIDE 0.9 % IV SOLN
1.1500 mg/m2 | Freq: Once | INTRAVENOUS | Status: AC
  Administered 2015-02-07: 2 mg via INTRAVENOUS
  Filled 2015-02-07: qty 4

## 2015-02-07 NOTE — Progress Notes (Signed)
Ok to treat with today's lab values per Dr. Lindi Adie.

## 2015-02-07 NOTE — Patient Instructions (Signed)
Eribulin solution for injection What is this medicine? ERIBULIN is a chemotherapy drug. It is used to treat breast cancer. This medicine may be used for other purposes; ask your health care provider or pharmacist if you have questions. COMMON BRAND NAME(S): Halaven What should I tell my health care provider before I take this medicine? They need to know if you have any of these conditions: -heart disease -kidney disease -liver disease -low blood counts, like low white cell, platelet, or red cell counts -an unusual or allergic reaction to eribulin, other medicines, foods, dyes, or preservatives -pregnant or trying to get pregnant -breast-feeding How should I use this medicine? This medicine is for infusion into a vein. It is given by a health care professional in a hospital or clinic setting. Talk to your pediatrician regarding the use of this medicine in children. Special care may be needed. Overdosage: If you think you've taken too much of this medicine contact a poison control center or emergency room at once. Overdosage: If you think you have taken too much of this medicine contact a poison control center or emergency room at once. NOTE: This medicine is only for you. Do not share this medicine with others. What if I miss a dose? It is important not to miss your dose. Call your doctor or health care professional if you are unable to keep an appointment. What may interact with this medicine? Do not take this medicine with any of the following medications: -amiodarone -astemizole -arsenic trioxide -bepridil -bretylium -chloroquine -chlorpromazine -cisapride -clarithromycin -dextromethorphan,  quinidine -disopyramide -dofetilide -droperidol -dronedarone -erythromycin -grepafloxacin -halofantrine -haloperidol -ibutilide -levomethadyl -mesoridazine -methadone -pentamidine -procainamide -quinidine -pimozide -posaconazole -probucol -propafenone -saquinavir -sotalol -sparfloxacin -terfenadine -thioridazine -troleandomycin -ziprasidone This list may not describe all possible interactions. Give your health care provider a list of all the medicines, herbs, non-prescription drugs, or dietary supplements you use. Also tell them if you smoke, drink alcohol, or use illegal drugs. Some items may interact with your medicine. What should I watch for while using this medicine? Your condition will be monitored carefully while you are receiving this medicine. This drug may make you feel generally unwell. This is not uncommon, as chemotherapy can affect healthy cells as well as cancer cells. Report any side effects. Continue your course of treatment even though you feel ill unless your doctor tells you to stop. Call your doctor or health care professional for advice if you get a fever, chills or sore throat, or other symptoms of a cold or flu. Do not treat yourself. This drug decreases your body's ability to fight infections. Try to avoid being around people who are sick. This medicine may increase your risk to bruise or bleed. Call your doctor or health care professional if you notice any unusual bleeding. Be careful brushing and flossing your teeth or using a toothpick because you may get an infection or bleed more easily. If you have any dental work done, tell your dentist you are receiving this medicine. Avoid taking products that contain aspirin, acetaminophen, ibuprofen, naproxen, or ketoprofen unless instructed by your doctor. These medicines may hide a fever. Do not become pregnant while taking this medicine. Women should inform their doctor if they wish to become pregnant or think  they might be pregnant. There is a potential for serious side effects to an unborn child. Talk to your health care professional or pharmacist for more information. Do not breast-feed an infant while taking this medicine. What side effects may I notice from receiving this medicine?  Side effects that you should report to your doctor or health care professional as soon as possible: -allergic reactions like skin rash, itching or hives, swelling of the face, lips, or tongue -low blood counts - this medicine may decrease the number of white blood cells, red blood cells and platelets. You may be at increased risk for infections and bleeding. -signs of infection - fever or chills, cough, sore throat, pain or difficulty passing urine -signs of decreased platelets or bleeding - bruising, pinpoint red spots on the skin, black, tarry stools, blood in the urine -signs of decreased red blood cells - unusually weak or tired, fainting spells, lightheadedness -pain, tingling, numbness in the hands or feet Side effects that usually do not require medical attention (Report these to your doctor or health care professional if they continue or are bothersome.): -constipation -hair loss -headache -loss of appetite -muscle or joint pain -nausea, vomiting -stomach pain This list may not describe all possible side effects. Call your doctor for medical advice about side effects. You may report side effects to FDA at 1-800-FDA-1088. Where should I keep my medicine? This drug is given in a hospital or clinic and will not be stored at home. NOTE: This sheet is a summary. It may not cover all possible information. If you have questions about this medicine, talk to your doctor, pharmacist, or health care provider.  2015, Elsevier/Gold Standard. (2009-04-24 23:04:37) Denosumab injection What is this medicine? DENOSUMAB (den oh sue mab) slows bone breakdown. Prolia is used to treat osteoporosis in women after menopause and  in men. Delton See is used to prevent bone fractures and other bone problems caused by cancer bone metastases. Delton See is also used to treat giant cell tumor of the bone. This medicine may be used for other purposes; ask your health care provider or pharmacist if you have questions. COMMON BRAND NAME(S): Prolia, XGEVA What should I tell my health care provider before I take this medicine? They need to know if you have any of these conditions: -dental disease -eczema -infection or history of infections -kidney disease or on dialysis -low blood calcium or vitamin D -malabsorption syndrome -scheduled to have surgery or tooth extraction -taking medicine that contains denosumab -thyroid or parathyroid disease -an unusual reaction to denosumab, other medicines, foods, dyes, or preservatives -pregnant or trying to get pregnant -breast-feeding How should I use this medicine? This medicine is for injection under the skin. It is given by a health care professional in a hospital or clinic setting. If you are getting Prolia, a special MedGuide will be given to you by the pharmacist with each prescription and refill. Be sure to read this information carefully each time. For Prolia, talk to your pediatrician regarding the use of this medicine in children. Special care may be needed. For Delton See, talk to your pediatrician regarding the use of this medicine in children. While this drug may be prescribed for children as young as 13 years for selected conditions, precautions do apply. Overdosage: If you think you've taken too much of this medicine contact a poison control center or emergency room at once. Overdosage: If you think you have taken too much of this medicine contact a poison control center or emergency room at once. NOTE: This medicine is only for you. Do not share this medicine with others. What if I miss a dose? It is important not to miss your dose. Call your doctor or health care professional if you  are unable to keep an appointment. What may  interact with this medicine? Do not take this medicine with any of the following medications: -other medicines containing denosumab This medicine may also interact with the following medications: -medicines that suppress the immune system -medicines that treat cancer -steroid medicines like prednisone or cortisone This list may not describe all possible interactions. Give your health care provider a list of all the medicines, herbs, non-prescription drugs, or dietary supplements you use. Also tell them if you smoke, drink alcohol, or use illegal drugs. Some items may interact with your medicine. What should I watch for while using this medicine? Visit your doctor or health care professional for regular checks on your progress. Your doctor or health care professional may order blood tests and other tests to see how you are doing. Call your doctor or health care professional if you get a cold or other infection while receiving this medicine. Do not treat yourself. This medicine may decrease your body's ability to fight infection. You should make sure you get enough calcium and vitamin D while you are taking this medicine, unless your doctor tells you not to. Discuss the foods you eat and the vitamins you take with your health care professional. See your dentist regularly. Brush and floss your teeth as directed. Before you have any dental work done, tell your dentist you are receiving this medicine. Do not become pregnant while taking this medicine or for 5 months after stopping it. Women should inform their doctor if they wish to become pregnant or think they might be pregnant. There is a potential for serious side effects to an unborn child. Talk to your health care professional or pharmacist for more information. What side effects may I notice from receiving this medicine? Side effects that you should report to your doctor or health care professional as  soon as possible: -allergic reactions like skin rash, itching or hives, swelling of the face, lips, or tongue -breathing problems -chest pain -fast, irregular heartbeat -feeling faint or lightheaded, falls -fever, chills, or any other sign of infection -muscle spasms, tightening, or twitches -numbness or tingling -skin blisters or bumps, or is dry, peels, or red -slow healing or unexplained pain in the mouth or jaw -unusual bleeding or bruising Side effects that usually do not require medical attention (Report these to your doctor or health care professional if they continue or are bothersome.): -muscle pain -stomach upset, gas This list may not describe all possible side effects. Call your doctor for medical advice about side effects. You may report side effects to FDA at 1-800-FDA-1088. Where should I keep my medicine? This medicine is only given in a clinic, doctor's office, or other health care setting and will not be stored at home. NOTE: This sheet is a summary. It may not cover all possible information. If you have questions about this medicine, talk to your doctor, pharmacist, or health care provider.  2015, Elsevier/Gold Standard. (2011-11-01 12:37:47)

## 2015-02-13 ENCOUNTER — Ambulatory Visit (HOSPITAL_COMMUNITY)
Admission: RE | Admit: 2015-02-13 | Discharge: 2015-02-13 | Disposition: A | Discharge status: Home / Self Care | Payer: 59 | Source: Ambulatory Visit | Attending: Hematology and Oncology | Admitting: Hematology and Oncology

## 2015-02-13 ENCOUNTER — Other Ambulatory Visit: Payer: Self-pay | Admitting: Hematology and Oncology

## 2015-02-13 DIAGNOSIS — C50111 Malignant neoplasm of central portion of right female breast: Secondary | ICD-10-CM

## 2015-02-13 DIAGNOSIS — C787 Secondary malignant neoplasm of liver and intrahepatic bile duct: Secondary | ICD-10-CM | POA: Insufficient documentation

## 2015-02-13 DIAGNOSIS — C7951 Secondary malignant neoplasm of bone: Secondary | ICD-10-CM | POA: Insufficient documentation

## 2015-02-13 DIAGNOSIS — Z9011 Acquired absence of right breast and nipple: Secondary | ICD-10-CM | POA: Diagnosis not present

## 2015-02-13 DIAGNOSIS — K5730 Diverticulosis of large intestine without perforation or abscess without bleeding: Secondary | ICD-10-CM | POA: Insufficient documentation

## 2015-02-13 DIAGNOSIS — R911 Solitary pulmonary nodule: Secondary | ICD-10-CM | POA: Diagnosis not present

## 2015-02-13 MED ORDER — IOHEXOL 300 MG/ML  SOLN
100.0000 mL | Freq: Once | INTRAMUSCULAR | Status: AC | PRN
  Administered 2015-02-13: 100 mL via INTRAVENOUS

## 2015-02-14 ENCOUNTER — Encounter (HOSPITAL_COMMUNITY)
Admission: RE | Admit: 2015-02-14 | Discharge: 2015-02-14 | Disposition: A | Discharge status: Home / Self Care | Payer: 59 | Source: Ambulatory Visit | Attending: Hematology and Oncology | Admitting: Hematology and Oncology

## 2015-02-14 DIAGNOSIS — C50111 Malignant neoplasm of central portion of right female breast: Secondary | ICD-10-CM | POA: Diagnosis not present

## 2015-02-14 MED ORDER — TECHNETIUM TC 99M MEDRONATE IV KIT: 27.1000 | PACK | Freq: Once | INTRAVENOUS | Status: DC | PRN

## 2015-02-14 MED ORDER — TECHNETIUM TC 99M MEDRONATE IV KIT
27.1000 | PACK | Freq: Once | INTRAVENOUS | Status: AC | PRN
  Administered 2015-02-14: 27.1 via INTRAVENOUS

## 2015-02-21 ENCOUNTER — Telehealth: Payer: Self-pay | Admitting: Hematology and Oncology

## 2015-02-21 ENCOUNTER — Encounter: Payer: Self-pay | Admitting: Hematology and Oncology

## 2015-02-21 ENCOUNTER — Ambulatory Visit (HOSPITAL_BASED_OUTPATIENT_CLINIC_OR_DEPARTMENT_OTHER): Payer: 59 | Admitting: Hematology and Oncology

## 2015-02-21 VITALS — BP 117/64 | HR 87 | Temp 98.4°F | Resp 18 | Ht 65.0 in | Wt 149.1 lb

## 2015-02-21 DIAGNOSIS — C787 Secondary malignant neoplasm of liver and intrahepatic bile duct: Secondary | ICD-10-CM | POA: Diagnosis not present

## 2015-02-21 DIAGNOSIS — D649 Anemia, unspecified: Secondary | ICD-10-CM

## 2015-02-21 DIAGNOSIS — C50111 Malignant neoplasm of central portion of right female breast: Secondary | ICD-10-CM

## 2015-02-21 DIAGNOSIS — D696 Thrombocytopenia, unspecified: Secondary | ICD-10-CM

## 2015-02-21 DIAGNOSIS — C7951 Secondary malignant neoplasm of bone: Secondary | ICD-10-CM | POA: Diagnosis not present

## 2015-02-21 NOTE — Telephone Encounter (Signed)
Appointments made and avs printed for patient °

## 2015-02-21 NOTE — Assessment & Plan Note (Signed)
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 Treated with Ibrance plus letrozole plus X Geva since 03/12/2014. PET/CT 07/22/2014 revealed multiple lymph nodes, multiple liver lesions, adrenal lesions, widespread bone metastases, started Halaven 08/09/2014 Cutaneous lesions: biopsy proven metastatic breast cancer 01/27/2015. Treatment plan: Halaven day 1 and day 8 every 3 weeks started 08/09/2014, change to day 1 day 15 every 4 weeks from 01/24/2015 with cycle 9  Current treatment: Halaven cycle 10 day 1  Chemotherapy toxicities: 1. Elevated AST and ALT and alkaline phosphatase: Due to liver involvement. We will continue to follow her liver function tests closely.  2. Thrombocytopenia: Most likely related to bone marrow involvement by her breast cancer. Count improved to 234 today.  3. Neutropenia: Neulasta on hold per patient preference. We will continue to monitor her counts very closely. 4. Anemia: hemoglobin now 11.9 5. Posterior scalp nodule: biopsy-proven metastatic breast cancer  PET/CT scan 12/06/2014: Monitor response to therapy with resolution of hypermetabolic nodal metastases within the neck and the chest, improvement to resolution of hypermetabolic liver metastases, resolved right and left adrenal metastases, near complete resolution of widespread bony metastases.  Bone scan 02/14/2015: Marked improvement in the appearance skeleton there remains abnormal uptake in the humeri, femurs and within ribs overall dramatic response to therapy  CT CAP 02/14/2015: Difficult interpretation.? A few lesions within both left and right hepatic lobes appear to be new or increased in size from prior exam,? New sclerotic lesions in humeral heads  bilaterally. 6 mm left lower lobe pulmonary nodule   Radiology review: I reviewed the CT and bone scan with the patient and described the findings. It appeared to be findings concerning for progression of disease. Especially the new liver lesions as well as subcutaneous metastases. We discussed different options including 1. Continuation of halaven for 12 cycles and then repeating a PET scan 2. Change in therapy either Xeloda oral therapy versus gemcitabine  Prognosis: Patient understands that none of these options are curative in nature. The goals of treatment prolong her life and to shrink her cancer.

## 2015-02-21 NOTE — Progress Notes (Signed)
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 ([place])   11/09/2012 Surgery 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 one out of 13 lymph nodes positive ER 100%, PR 29%, HER-2 negative ratio 0.93 Ki-67 89%   09/26/2013 - 11/08/2013 Radiation Therapy Radiation to right chest wall and right supraclavicular region to a dose of 50.4 gray at 1.8 gray per fraction    Anti-estrogen oral therapy Patient refused antiestrogen therapy and chemotherapy, receives Blue crystal treatment from [place] (patch that sucks out impurities)   02/21/2014 PET scan Diffuse bone metastases involving the axial and appendicular skeleton, biopsy of the left iliac bone positive for metastatic breast cancer   03/12/2014 Treatment Plan Change Ibrance with letrozole and Xgeva    07/22/2014 PET scan New hypermetabolic lymph nodes in the neck and chest, new abnormalities in the liver without underlying CT imaging abnormality, both adrenal glands are hypermetabolic widespread bony metastases as before   08/09/2014 -  Chemotherapy Halaven day 1, day 8 every 3 weeks   10/04/2014 Initial Biopsy Bone marrow biopsy showing extensive infiltration of breast cancer in the bone marrow; CT chest abdomen and pelvis after 3 cycles of Halaven: reveals stable bone disease and stable liver lesions   12/06/2014 PET scan resolution  of  nodal metastases within the neck and the chest, Resolution of hypermetabolic liver metastases, resolved right and left adrenal metastases, near complete resolution of widespread bony metastases.    CHIEF COMPLIANT: patient here to discuss scans  INTERVAL HISTORY: [name] is a 59 year old with above-mentioned history of metastatic breast cancer who underwent CT scans and bone scans and is here today to discuss the scans and further treatment. She also had a subcutaneous metastases that was excised by Dr. Dalbert Batman which came back as metastatic breast cancer. She reports that some of these nodules have shrunk in size from before.  REVIEW OF SYSTEMS:   Constitutional: Denies fevers, chills or abnormal weight loss Eyes: Denies blurriness of vision Ears, nose, mouth, throat, and face: Denies mucositis or sore throat Respiratory: Denies cough, dyspnea or wheezes Cardiovascular: Denies palpitation, chest discomfort or lower extremity swelling Gastrointestinal:  Denies nausea, heartburn or change in bowel habits Skin: subcutaneous metastases Lymphatics: Denies new lymphadenopathy or easy bruising Neurological:Denies numbness, tingling or new weaknesses Behavioral/Psych: Mood is stable, no new changes  All other systems were reviewed with the patient and are negative.  I have reviewed the past medical history, past surgical history, social history and family history with the patient and they are unchanged from previous note.  ALLERGIES:  is allergic to penicillins.  MEDICATIONS:  Current Outpatient Prescriptions  Medication Sig Dispense Refill  . Alum & Mag Hydroxide-Simeth (MAGIC MOUTHWASH W/LIDOCAINE) SOLN Take 5 mLs by mouth 4 (four) times daily as needed for mouth pain. 120 mL 0  . Calcium Carbonate-Vit D-Min (CALCIUM 1200  PO) Take 1 tablet by mouth daily at 12 noon.     Marland Kitchen ibuprofen (ADVIL,MOTRIN) 200 MG tablet Take 200 mg by mouth every 4 (four) hours as needed for moderate pain.     Marland Kitchen lidocaine-prilocaine (EMLA) cream Apply to affected area once 30 g 3  . LORazepam (ATIVAN) 0.5 MG tablet TAKE 1 TABLET EVERY 6 HOURS AS NEEDED 30 tablet 0  . Multiple Vitamins-Minerals (MULTIVITAMIN PO) Take 1 tablet by mouth daily at 12 noon.     . ondansetron (ZOFRAN) 8 MG tablet Take 1 tablet (8 mg total) by mouth 2 (two) times daily. The day after chemo for 2 days, then every 8 hours PRN for N/V 30 tablet 1  . OVER THE COUNTER MEDICATION Take 4 tablets by mouth 2 (two) times daily. ([place])    . oxyCODONE-acetaminophen (PERCOCET/ROXICET) 5-325 MG per tablet 1-2 tabs every 6 hours as needed 30 tablet 0  . prochlorperazine (COMPAZINE) 10 MG tablet Take 1 tablet (10 mg total) by mouth every 6 (six) hours as needed (Nausea or vomiting). 30 tablet 1  . traMADol (ULTRAM) 50 MG tablet Take 50 mg by mouth every 6 (six) hours as needed for moderate pain.    Marland Kitchen UNABLE TO FIND [id number] Silicone Breast Prosthesis, Rt quantity 1 L8020 Mastectomy form, Rt, quantity 1 L8000 Mastectomy bra, quantity 6    . UNABLE TO FIND 174.9 Malignant Neoplasm   Mastectomy Right  L8030-Silicone Breast Prosthesis-1  L8000- Mastectomy Bra-2 1 each 0   No current facility-administered medications for this visit.    PHYSICAL EXAMINATION: ECOG PERFORMANCE STATUS: 1 - Symptomatic but completely ambulatory  Filed Vitals:   02/21/15 1011  BP: 117/64  Pulse: 87  Temp: 98.4 F (36.9 C)  Resp: 18   Filed Weights   02/21/15 1011  Weight: 149 lb 1.6 oz (67.631 kg)    GENERAL:alert, no distress and comfortable SKIN: cutaneous metastasis in the right shoulder measures 0.4 mm to palpation EYES: normal, Conjunctiva are pink and non-injected, sclera clear OROPHARYNX:no exudate, no erythema and lips, buccal mucosa, and tongue normal  NECK: supple, thyroid normal size, non-tender, without nodularity LYMPH:  no palpable lymphadenopathy in the cervical, axillary or inguinal LUNGS: clear to auscultation and percussion  with normal breathing effort HEART: regular rate & rhythm and no murmurs and no lower extremity edema ABDOMEN:abdomen soft, non-tender and normal bowel sounds Musculoskeletal:no cyanosis of digits and no clubbing  NEURO: alert & oriented x 3 with fluent speech, no focal motor/sensory deficits  LABORATORY DATA:  I have reviewed the data as listed   Chemistry      Component Value Date/Time   NA 145 02/07/2015 1020   NA 141 08/30/2014 0953   K 4.0 02/07/2015 1020   K 4.1 08/30/2014 0953   CL 110 08/30/2014 0953   CL 106 10/23/2012 1459   CO2 28 02/07/2015 1020   CO2 24 08/30/2014 0953   BUN 18.2 02/07/2015 1020   BUN 9 08/30/2014 0953   CREATININE 0.9 02/07/2015 1020   CREATININE 0.67 08/30/2014 0953   CREATININE 0.82 08/18/2011 1056      Component Value Date/Time   CALCIUM 9.4 02/07/2015 1020   CALCIUM 7.3* 08/30/2014 0953   ALKPHOS 213* 02/07/2015 1020   ALKPHOS 414* 08/30/2014 0953   AST 43* 02/07/2015 1020   AST 59* 08/30/2014 0953   ALT 42 02/07/2015 1020   ALT 38* 08/30/2014 0953   BILITOT 0.41 02/07/2015 1020   BILITOT 0.3 08/30/2014 2202  Lab Results  Component Value Date   WBC 3.9 02/07/2015   HGB 11.6 02/07/2015   HCT 36.2 02/07/2015   MCV 82.8 02/07/2015   PLT 278 02/07/2015   NEUTROABS 2.4 02/07/2015   ASSESSMENT & PLAN:  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 Treated with Ibrance plus letrozole plus X [place] since 03/12/2014. PET/CT 07/22/2014 revealed multiple lymph nodes, multiple liver lesions, adrenal lesions, widespread bone metastases, started Halaven 08/09/2014 Cutaneous lesions: biopsy proven metastatic breast cancer 01/27/2015. Treatment  plan: Halaven day 1 and day 8 every 3 weeks started 08/09/2014, change to day 1 day 15 every 4 weeks from 01/24/2015 with cycle 9  Current treatment: Halaven cycle 10 day 1 to start 02/28/2015  Chemotherapy toxicities: 1. Elevated AST and ALT and alkaline phosphatase: Due to liver involvement. We will continue to follow her liver function tests closely.  2. Thrombocytopenia: Most likely related to bone marrow involvement by her breast cancer. Count improved to 234 today.  3. Neutropenia: Neulasta on hold per patient preference. We will continue to monitor her counts very closely. 4. Anemia: hemoglobin now 11.9 5. Posterior scalp nodule: biopsy-proven metastatic breast cancer  PET/CT scan 12/06/2014: Monitor response to therapy with resolution of hypermetabolic nodal metastases within the neck and the chest, improvement to resolution of hypermetabolic liver metastases, resolved right and left adrenal metastases, near complete resolution of widespread bony metastases.  Bone scan 02/14/2015: Marked improvement in the appearance skeleton there remains abnormal uptake in the humeri, femurs and within ribs overall dramatic response to therapy  CT CAP 02/14/2015: Difficult interpretation.? A few lesions within both left and right hepatic lobes appear to be new or increased in size from prior exam,? New sclerotic lesions in humeral heads bilaterally. 6 mm left lower lobe pulmonary nodule   Radiology review: I reviewed the CT and bone scan with the patient and described the findings. It appeared to be findings concerning for progression of disease. Especially the new liver lesions as well as subcutaneous metastases. We discussed different options including 1. Continuation of halaven for 12 cycles and then repeating a PET scan 2. Change in therapy either Xeloda oral therapy versus gemcitabine  Plan: After much discussion, we elected to remain on Halaven for 12 cycles and then get a PET CT scan. I believe  that there are mixed response to chemotherapy and the waxing and waning of subcutaneous metastases suggest that we should continue with the same regimen. If somehow subcutaneous metastasis increased dramatically, then we will consider changing treatment. Her bone marrow function is really excellent also another suggestion of excellent response to chemotherapy. She had previously extensive bone marrow involvement. She will resume chemotherapy starting next week since she was not scheduled for treatment today.  Prognosis: Patient understands that none of these options are curative in nature. The goals of treatment prolong her life and to shrink her cancer.  No orders of the defined types were placed in this encounter.   The patient has a good understanding of the overall plan. she agrees with it. she will call with any problems that may develop before the next visit here.   Rulon Eisenmenger, MD

## 2015-02-28 ENCOUNTER — Ambulatory Visit (HOSPITAL_BASED_OUTPATIENT_CLINIC_OR_DEPARTMENT_OTHER): Payer: 59

## 2015-02-28 ENCOUNTER — Other Ambulatory Visit (HOSPITAL_BASED_OUTPATIENT_CLINIC_OR_DEPARTMENT_OTHER): Payer: 59

## 2015-02-28 VITALS — BP 125/70 | HR 68 | Temp 98.4°F | Resp 18

## 2015-02-28 DIAGNOSIS — Z5111 Encounter for antineoplastic chemotherapy: Secondary | ICD-10-CM

## 2015-02-28 DIAGNOSIS — C50111 Malignant neoplasm of central portion of right female breast: Secondary | ICD-10-CM | POA: Diagnosis not present

## 2015-02-28 DIAGNOSIS — C7951 Secondary malignant neoplasm of bone: Secondary | ICD-10-CM | POA: Diagnosis not present

## 2015-02-28 LAB — CBC WITH DIFFERENTIAL/PLATELET
BASO%: 0.6 % (ref 0.0–2.0)
BASOS ABS: 0 10*3/uL (ref 0.0–0.1)
EOS ABS: 0 10*3/uL (ref 0.0–0.5)
EOS%: 0.7 % (ref 0.0–7.0)
HEMATOCRIT: 33.7 % — AB (ref 34.8–46.6)
HEMOGLOBIN: 10.8 g/dL — AB (ref 11.6–15.9)
LYMPH#: 0.9 10*3/uL (ref 0.9–3.3)
LYMPH%: 16.2 % (ref 14.0–49.7)
MCH: 26.4 pg (ref 25.1–34.0)
MCHC: 32.1 g/dL (ref 31.5–36.0)
MCV: 82.1 fL (ref 79.5–101.0)
MONO#: 0.6 10*3/uL (ref 0.1–0.9)
MONO%: 10.8 % (ref 0.0–14.0)
NEUT%: 71.7 % (ref 38.4–76.8)
NEUTROS ABS: 4 10*3/uL (ref 1.5–6.5)
Platelets: 289 10*3/uL (ref 145–400)
RBC: 4.1 10*6/uL (ref 3.70–5.45)
RDW: 18.3 % — ABNORMAL HIGH (ref 11.2–14.5)
WBC: 5.6 10*3/uL (ref 3.9–10.3)

## 2015-02-28 LAB — COMPREHENSIVE METABOLIC PANEL (CC13)
ALBUMIN: 3.4 g/dL — AB (ref 3.5–5.0)
ALK PHOS: 442 U/L — AB (ref 40–150)
ALT: 53 U/L (ref 0–55)
AST: 56 U/L — AB (ref 5–34)
Anion Gap: 10 mEq/L (ref 3–11)
BILIRUBIN TOTAL: 0.39 mg/dL (ref 0.20–1.20)
BUN: 16.1 mg/dL (ref 7.0–26.0)
CALCIUM: 9.5 mg/dL (ref 8.4–10.4)
CO2: 26 mEq/L (ref 22–29)
Chloride: 106 mEq/L (ref 98–109)
Creatinine: 0.8 mg/dL (ref 0.6–1.1)
EGFR: 78 mL/min/{1.73_m2} — ABNORMAL LOW (ref >90)
GLUCOSE: 79 mg/dL (ref 70–140)
POTASSIUM: 3.9 meq/L (ref 3.5–5.1)
SODIUM: 142 meq/L (ref 136–145)
TOTAL PROTEIN: 7.3 g/dL (ref 6.4–8.3)

## 2015-02-28 MED ORDER — HEPARIN SOD (PORK) LOCK FLUSH 100 UNIT/ML IV SOLN
500.0000 [IU] | Freq: Once | INTRAVENOUS | Status: AC | PRN
  Administered 2015-02-28: 500 [IU]
  Filled 2015-02-28: qty 5

## 2015-02-28 MED ORDER — SODIUM CHLORIDE 0.9 % IV SOLN
Freq: Once | INTRAVENOUS | Status: AC
  Administered 2015-02-28: 15:00:00 via INTRAVENOUS

## 2015-02-28 MED ORDER — SODIUM CHLORIDE 0.9 % IV SOLN
Freq: Once | INTRAVENOUS | Status: AC
  Administered 2015-02-28: 16:00:00 via INTRAVENOUS
  Filled 2015-02-28: qty 4

## 2015-02-28 MED ORDER — SODIUM CHLORIDE 0.9 % IV SOLN
1.1500 mg/m2 | Freq: Once | INTRAVENOUS | Status: AC
  Administered 2015-02-28: 2 mg via INTRAVENOUS
  Filled 2015-02-28: qty 4

## 2015-02-28 MED ORDER — SODIUM CHLORIDE 0.9 % IJ SOLN
10.0000 mL | INTRAMUSCULAR | Status: DC | PRN
  Administered 2015-02-28: 10 mL
  Filled 2015-02-28: qty 10

## 2015-02-28 NOTE — Progress Notes (Signed)
Labs noted increased alk phos- reviewed with MD- Dr Jana Hakim and received ok to proceed with treatment.

## 2015-02-28 NOTE — Patient Instructions (Signed)
[facility] Discharge Instructions for Patients Receiving Chemotherapy  Today you received the following chemotherapy agents halavan  To help prevent nausea and vomiting after your treatment, we encourage you to take your nausea medication   If you develop nausea and vomiting that is not controlled by your nausea medication, call the clinic.   BELOW ARE SYMPTOMS THAT SHOULD BE REPORTED IMMEDIATELY:  *FEVER GREATER THAN 100.5 F  *CHILLS WITH OR WITHOUT FEVER  NAUSEA AND VOMITING THAT IS NOT CONTROLLED WITH YOUR NAUSEA MEDICATION  *UNUSUAL SHORTNESS OF BREATH  *UNUSUAL BRUISING OR BLEEDING  TENDERNESS IN MOUTH AND THROAT WITH OR WITHOUT PRESENCE OF ULCERS  *URINARY PROBLEMS  *BOWEL PROBLEMS  UNUSUAL RASH Items with * indicate a potential emergency and should be followed up as soon as possible.  Feel free to call the clinic you have any questions or concerns. The clinic phone number is [phone].  Please show the [place] at check-in to the Emergency Department and triage nurse.

## 2015-03-14 ENCOUNTER — Other Ambulatory Visit (HOSPITAL_BASED_OUTPATIENT_CLINIC_OR_DEPARTMENT_OTHER): Payer: 59

## 2015-03-14 ENCOUNTER — Ambulatory Visit (HOSPITAL_BASED_OUTPATIENT_CLINIC_OR_DEPARTMENT_OTHER): Payer: 59

## 2015-03-14 VITALS — BP 135/70 | HR 84 | Temp 98.4°F | Resp 18

## 2015-03-14 DIAGNOSIS — C50111 Malignant neoplasm of central portion of right female breast: Secondary | ICD-10-CM

## 2015-03-14 DIAGNOSIS — Z5111 Encounter for antineoplastic chemotherapy: Secondary | ICD-10-CM

## 2015-03-14 DIAGNOSIS — C7951 Secondary malignant neoplasm of bone: Secondary | ICD-10-CM | POA: Diagnosis not present

## 2015-03-14 LAB — COMPREHENSIVE METABOLIC PANEL (CC13)
ALBUMIN: 3.3 g/dL — AB (ref 3.5–5.0)
ALT: 95 U/L — AB (ref 0–55)
AST: 94 U/L — AB (ref 5–34)
Alkaline Phosphatase: 565 U/L — ABNORMAL HIGH (ref 40–150)
Anion Gap: 9 mEq/L (ref 3–11)
BUN: 16 mg/dL (ref 7.0–26.0)
CALCIUM: 9.8 mg/dL (ref 8.4–10.4)
CHLORIDE: 104 meq/L (ref 98–109)
CO2: 28 mEq/L (ref 22–29)
CREATININE: 0.8 mg/dL (ref 0.6–1.1)
EGFR: 81 mL/min/{1.73_m2} — ABNORMAL LOW (ref >90)
GLUCOSE: 89 mg/dL (ref 70–140)
Potassium: 4.2 mEq/L (ref 3.5–5.1)
Sodium: 141 mEq/L (ref 136–145)
TOTAL PROTEIN: 7.4 g/dL (ref 6.4–8.3)
Total Bilirubin: 0.48 mg/dL (ref 0.20–1.20)

## 2015-03-14 LAB — CBC WITH DIFFERENTIAL/PLATELET
BASO%: 1 % (ref 0.0–2.0)
BASOS ABS: 0 10*3/uL (ref 0.0–0.1)
EOS%: 0.9 % (ref 0.0–7.0)
Eosinophils Absolute: 0 10*3/uL (ref 0.0–0.5)
HEMATOCRIT: 34.2 % — AB (ref 34.8–46.6)
HEMOGLOBIN: 10.7 g/dL — AB (ref 11.6–15.9)
LYMPH#: 1.2 10*3/uL (ref 0.9–3.3)
LYMPH%: 25.1 % (ref 14.0–49.7)
MCH: 25.6 pg (ref 25.1–34.0)
MCHC: 31.4 g/dL — ABNORMAL LOW (ref 31.5–36.0)
MCV: 81.5 fL (ref 79.5–101.0)
MONO#: 0.9 10*3/uL (ref 0.1–0.9)
MONO%: 18.8 % — ABNORMAL HIGH (ref 0.0–14.0)
NEUT#: 2.5 10*3/uL (ref 1.5–6.5)
NEUT%: 54.2 % (ref 38.4–76.8)
PLATELETS: 313 10*3/uL (ref 145–400)
RBC: 4.2 10*6/uL (ref 3.70–5.45)
RDW: 19 % — AB (ref 11.2–14.5)
WBC: 4.6 10*3/uL (ref 3.9–10.3)

## 2015-03-14 MED ORDER — HEPARIN SOD (PORK) LOCK FLUSH 100 UNIT/ML IV SOLN
500.0000 [IU] | Freq: Once | INTRAVENOUS | Status: AC | PRN
  Administered 2015-03-14: 500 [IU]
  Filled 2015-03-14: qty 5

## 2015-03-14 MED ORDER — SODIUM CHLORIDE 0.9 % IJ SOLN
10.0000 mL | INTRAMUSCULAR | Status: DC | PRN
  Administered 2015-03-14: 10 mL
  Filled 2015-03-14: qty 10

## 2015-03-14 MED ORDER — SODIUM CHLORIDE 0.9 % IV SOLN
Freq: Once | INTRAVENOUS | Status: AC
  Administered 2015-03-14: 15:00:00 via INTRAVENOUS
  Filled 2015-03-14: qty 4

## 2015-03-14 MED ORDER — SODIUM CHLORIDE 0.9 % IV SOLN
0.7000 mg/m2 | Freq: Once | INTRAVENOUS | Status: AC
  Administered 2015-03-14: 1.2 mg via INTRAVENOUS
  Filled 2015-03-14: qty 2.4

## 2015-03-14 MED ORDER — SODIUM CHLORIDE 0.9 % IV SOLN
Freq: Once | INTRAVENOUS | Status: AC
  Administered 2015-03-14: 14:00:00 via INTRAVENOUS

## 2015-03-14 NOTE — Progress Notes (Signed)
Pt Alk Phos elevated 565 and AST/ALT level 94/95. Pt reports intermittent blurry vision since last Halaven treatment 2 weeks ago. Dr. Lindi Adie called and aware of pt abnormal lab today. He states OK to continue treatment with Halaven today. Will advise pt to monitor for worsening of vision, and to call and report symptoms. Dr. Lindi Adie will see pt next appointment on 11/11 and have blood draws then. Pt aware and understands plan of care at this time.

## 2015-03-14 NOTE — Patient Instructions (Signed)
[facility] Discharge Instructions for Patients Receiving Chemotherapy  Today you received the following chemotherapy agents: Halaven  To help prevent nausea and vomiting after your treatment, we encourage you to take your nausea medication as prescribed by your physician.   If you develop nausea and vomiting that is not controlled by your nausea medication, call the clinic.   BELOW ARE SYMPTOMS THAT SHOULD BE REPORTED IMMEDIATELY:  *FEVER GREATER THAN 100.5 F  *CHILLS WITH OR WITHOUT FEVER  NAUSEA AND VOMITING THAT IS NOT CONTROLLED WITH YOUR NAUSEA MEDICATION  *UNUSUAL SHORTNESS OF BREATH  *UNUSUAL BRUISING OR BLEEDING  TENDERNESS IN MOUTH AND THROAT WITH OR WITHOUT PRESENCE OF ULCERS  *URINARY PROBLEMS  *BOWEL PROBLEMS  UNUSUAL RASH Items with * indicate a potential emergency and should be followed up as soon as possible.  Feel free to call the clinic you have any questions or concerns. The clinic phone number is [phone].  Please show the CHEMO ALERT CARD at check-in to the Emergency Department and triage nurse.    

## 2015-03-27 ENCOUNTER — Other Ambulatory Visit: Payer: Self-pay | Admitting: *Deleted

## 2015-03-27 DIAGNOSIS — C50111 Malignant neoplasm of central portion of right female breast: Secondary | ICD-10-CM

## 2015-03-28 ENCOUNTER — Other Ambulatory Visit (HOSPITAL_BASED_OUTPATIENT_CLINIC_OR_DEPARTMENT_OTHER): Payer: 59

## 2015-03-28 ENCOUNTER — Ambulatory Visit: Payer: 59

## 2015-03-28 ENCOUNTER — Ambulatory Visit (HOSPITAL_BASED_OUTPATIENT_CLINIC_OR_DEPARTMENT_OTHER): Payer: 59 | Admitting: Hematology and Oncology

## 2015-03-28 ENCOUNTER — Other Ambulatory Visit: Payer: Self-pay | Admitting: *Deleted

## 2015-03-28 ENCOUNTER — Telehealth: Payer: Self-pay | Admitting: Hematology and Oncology

## 2015-03-28 ENCOUNTER — Encounter: Payer: Self-pay | Admitting: Hematology and Oncology

## 2015-03-28 VITALS — BP 115/62 | HR 99 | Temp 98.1°F | Resp 18 | Ht 65.0 in | Wt 147.4 lb

## 2015-03-28 DIAGNOSIS — C7951 Secondary malignant neoplasm of bone: Secondary | ICD-10-CM

## 2015-03-28 DIAGNOSIS — D61810 Antineoplastic chemotherapy induced pancytopenia: Secondary | ICD-10-CM

## 2015-03-28 DIAGNOSIS — C787 Secondary malignant neoplasm of liver and intrahepatic bile duct: Secondary | ICD-10-CM

## 2015-03-28 DIAGNOSIS — C50111 Malignant neoplasm of central portion of right female breast: Secondary | ICD-10-CM

## 2015-03-28 DIAGNOSIS — T451X5A Adverse effect of antineoplastic and immunosuppressive drugs, initial encounter: Secondary | ICD-10-CM

## 2015-03-28 LAB — CBC WITH DIFFERENTIAL/PLATELET
BASO%: 0.5 % (ref 0.0–2.0)
Basophils Absolute: 0 10*3/uL (ref 0.0–0.1)
EOS ABS: 0 10*3/uL (ref 0.0–0.5)
EOS%: 0.6 % (ref 0.0–7.0)
HCT: 36.1 % (ref 34.8–46.6)
HGB: 11.3 g/dL — ABNORMAL LOW (ref 11.6–15.9)
LYMPH%: 13.3 % — AB (ref 14.0–49.7)
MCH: 25.6 pg (ref 25.1–34.0)
MCHC: 31.2 g/dL — AB (ref 31.5–36.0)
MCV: 81.9 fL (ref 79.5–101.0)
MONO#: 0.6 10*3/uL (ref 0.1–0.9)
MONO%: 11 % (ref 0.0–14.0)
NEUT#: 3.8 10*3/uL (ref 1.5–6.5)
NEUT%: 74.6 % (ref 38.4–76.8)
PLATELETS: 356 10*3/uL (ref 145–400)
RBC: 4.41 10*6/uL (ref 3.70–5.45)
RDW: 20.2 % — ABNORMAL HIGH (ref 11.2–14.5)
WBC: 5.1 10*3/uL (ref 3.9–10.3)
lymph#: 0.7 10*3/uL — ABNORMAL LOW (ref 0.9–3.3)

## 2015-03-28 LAB — COMPREHENSIVE METABOLIC PANEL (CC13)
ALT: 112 U/L — ABNORMAL HIGH (ref 0–55)
ANION GAP: 14 meq/L — AB (ref 3–11)
AST: 179 U/L (ref 5–34)
Albumin: 3.2 g/dL — ABNORMAL LOW (ref 3.5–5.0)
Alkaline Phosphatase: 1157 U/L — ABNORMAL HIGH (ref 40–150)
BILIRUBIN TOTAL: 0.87 mg/dL (ref 0.20–1.20)
BUN: 15.5 mg/dL (ref 7.0–26.0)
CHLORIDE: 101 meq/L (ref 98–109)
CO2: 26 meq/L (ref 22–29)
Calcium: 9.5 mg/dL (ref 8.4–10.4)
Creatinine: 0.9 mg/dL (ref 0.6–1.1)
EGFR: 66 mL/min/{1.73_m2} — AB (ref >90)
GLUCOSE: 121 mg/dL (ref 70–140)
POTASSIUM: 4.4 meq/L (ref 3.5–5.1)
SODIUM: 141 meq/L (ref 136–145)
Total Protein: 7.5 g/dL (ref 6.4–8.3)

## 2015-03-28 MED ORDER — DEXAMETHASONE 4 MG PO TABS: 4.0000 mg | ORAL_TABLET | Freq: Two times a day (BID) | ORAL | Status: AC

## 2015-03-28 NOTE — Assessment & Plan Note (Signed)
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 Treated with Ibrance plus letrozole plus X Geva since 03/12/2014. PET/CT 07/22/2014 revealed multiple lymph nodes, multiple liver lesions, adrenal lesions, widespread bone metastases, started Halaven 08/09/2014  PET/CT scan 12/06/2014: Monitor response to therapy with resolution of hypermetabolic nodal metastases within the neck and the chest, improvement to resolution of hypermetabolic liver metastases, resolved right and left adrenal metastases, near complete resolution of widespread bony metastases.  Bone scan 02/14/2015: Marked improvement in the appearance skeleton there remains abnormal uptake in the humeri, femurs and within ribs overall dramatic response to therapy  CT CAP 02/14/2015: Difficult interpretation.? A few lesions within both left and right hepatic lobes appear to be new or increased in size from prior exam,? New sclerotic lesions in humeral heads bilaterally. 6 mm left lower lobe pulmonary nodule  Cutaneous lesions: biopsy proven metastatic breast cancer 01/27/2015. ----------------------------------------------------------------------------------------------------------------------------------------------- Treatment plan: Halaven day 1 and day 8 every 3 weeks started 08/09/2014, change to day 1 day 15 every 4 weeks from 01/24/2015 with cycle 9, completed 10 cycles  Worsening AST and ALT and alkaline phosphatase: I'm concerned of progression in the liver. Would like to obtain PET/CT to assess treatment response and determine if she is progressing on Halaven.  Return to clinic after PET/CT scan to discuss further treatment

## 2015-03-28 NOTE — Telephone Encounter (Signed)
Appointments made and avs printed for patient °

## 2015-03-28 NOTE — Progress Notes (Signed)
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 ([place])   11/09/2012 Surgery 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 one out of 13 lymph nodes positive ER 100%, PR 29%, HER-2 negative ratio 0.93 Ki-67 89%   09/26/2013 - 11/08/2013 Radiation Therapy Radiation to right chest wall and right supraclavicular region to a dose of 50.4 gray at 1.8 gray per fraction    Anti-estrogen oral therapy Patient refused antiestrogen therapy and chemotherapy, receives Blue crystal treatment from [place] (patch that sucks out impurities)   02/21/2014 PET scan Diffuse bone metastases involving the axial and appendicular skeleton, biopsy of the left iliac bone positive for metastatic breast cancer   03/12/2014 Treatment Plan Change Ibrance with letrozole and Xgeva    07/22/2014 PET scan New hypermetabolic lymph nodes in the neck and chest, new abnormalities in the liver without underlying CT imaging abnormality, both adrenal glands are hypermetabolic widespread bony metastases as before   08/09/2014 -  Chemotherapy Halaven day 1, day 8 every 3 weeks   10/04/2014 Initial Biopsy Bone marrow biopsy showing extensive infiltration of breast cancer in the bone marrow; CT chest abdomen and pelvis after 3 cycles of Halaven: reveals stable bone disease and stable liver lesions   12/06/2014 PET scan  resolution of  nodal metastases within the neck and the chest, Resolution of hypermetabolic liver metastases, resolved right and left adrenal metastases, near complete resolution of widespread bony metastases.   02/13/2015 Imaging CTs and bone scan: Decreased number of metastatic lesions in the liver, however a few lesions were new or increased. New sclerotic lesions in the humeral heads bilaterally, 6 mm left lower lobe lung nodule    CHIEF COMPLIANT: blurring of vision and unsteadiness of gait  INTERVAL HISTORY: [name] is a 59 year old with above-mentioned history of metastatic breast cancer. She was on Halaven comes in today complaining of blurred vision and unsteadiness of gait. This is not all the time. Currently she does not have the symptom. She has noticed this since last week. Her AST and ALT have been increasing steadily. Her alkaline phosphatase is also increasing. We had to reduce the dosage of her chemotherapy last week. All these symptoms are concerning for progression of disease.  REVIEW OF SYSTEMS:   Constitutional: Denies fevers, chills or abnormal weight loss Eyes: patient has blurriness of vision Ears, nose, mouth, throat, and face: Denies mucositis or sore throat Respiratory: Denies cough, dyspnea or wheezes Cardiovascular: Denies palpitation, chest discomfort or lower extremity swelling Gastrointestinal:  Denies nausea, heartburn or change in bowel habits Skin: Denies abnormal skin rashes Lymphatics: Denies new lymphadenopathy or easy bruising Neurological:slight blurring of vision or unsteadiness of gait education Behavioral/Psych: Mood is stable, no new changes   All other systems were reviewed with the patient and are negative.  I have reviewed the past medical history, past surgical  history, social history and family history with the patient and they are unchanged from previous note.  ALLERGIES:  is allergic to penicillins.  MEDICATIONS:  Current Outpatient  Prescriptions  Medication Sig Dispense Refill  . Alum & Mag Hydroxide-Simeth (MAGIC MOUTHWASH W/LIDOCAINE) SOLN Take 5 mLs by mouth 4 (four) times daily as needed for mouth pain. 120 mL 0  . Calcium Carbonate-Vit D-Min (CALCIUM 1200 PO) Take 1 tablet by mouth daily at 12 noon.     Marland Kitchen dexamethasone (DECADRON) 4 MG tablet Take 1 tablet (4 mg total) by mouth 2 (two) times daily. 60 tablet 0  . ibuprofen (ADVIL,MOTRIN) 200 MG tablet Take 200 mg by mouth every 4 (four) hours as needed for moderate pain.    Marland Kitchen lidocaine-prilocaine (EMLA) cream Apply to affected area once 30 g 3  . LORazepam (ATIVAN) 0.5 MG tablet TAKE 1 TABLET EVERY 6 HOURS AS NEEDED 30 tablet 0  . Multiple Vitamins-Minerals (MULTIVITAMIN PO) Take 1 tablet by mouth daily at 12 noon.     . ondansetron (ZOFRAN) 8 MG tablet Take 1 tablet (8 mg total) by mouth 2 (two) times daily. The day after chemo for 2 days, then every 8 hours PRN for N/V 30 tablet 1  . OVER THE COUNTER MEDICATION Take 4 tablets by mouth 2 (two) times daily. ([place])    . oxyCODONE-acetaminophen (PERCOCET/ROXICET) 5-325 MG per tablet 1-2 tabs every 6 hours as needed 30 tablet 0  . prochlorperazine (COMPAZINE) 10 MG tablet Take 1 tablet (10 mg total) by mouth every 6 (six) hours as needed (Nausea or vomiting). 30 tablet 1  . traMADol (ULTRAM) 50 MG tablet Take 50 mg by mouth every 6 (six) hours as needed for moderate pain.    Marland Kitchen UNABLE TO FIND S5053 Silicone Breast Prosthesis, Rt quantity 1 L8020 Mastectomy form, Rt, quantity 1 L8000 Mastectomy bra, quantity 6    . UNABLE TO FIND 174.9 Malignant Neoplasm   Mastectomy Right  L8030-Silicone Breast Prosthesis-1  L8000- Mastectomy Bra-2 1 each 0   No current facility-administered medications for this visit.    PHYSICAL EXAMINATION: ECOG PERFORMANCE STATUS: 1 - Symptomatic but completely ambulatory  Filed Vitals:   03/28/15 1114  BP: 115/62  Pulse: 99  Temp: 98.1 F (36.7 C)  Resp: 18   Filed Weights    03/28/15 1114  Weight: 147 lb 6.4 oz (66.86 kg)    GENERAL:alert, no distress and comfortable SKIN: skin color, texture, turgor are normal, no rashes or significant lesions EYES: normal, Conjunctiva are pink and non-injected, sclera clear OROPHARYNX:no exudate, no erythema and lips, buccal mucosa, and tongue normal  NECK: supple, thyroid normal size, non-tender, without nodularity LYMPH:  no palpable lymphadenopathy in the cervical, axillary or inguinal LUNGS: clear to auscultation and percussion with normal breathing effort HEART: regular rate & rhythm and no murmurs and no lower extremity edema ABDOMEN:abdomen soft, non-tender and normal bowel sounds Musculoskeletal:no cyanosis of digits and no clubbing  NEURO: alert & oriented x 3 with fluent speech, no current gait difficulties. No focal motor sensory deficits.   LABORATORY DATA:  I have reviewed the data as listed   Chemistry      Component Value Date/Time   NA 141 03/28/2015 1105   NA 141 08/30/2014 0953   K 4.4 03/28/2015 1105   K 4.1 08/30/2014 0953   CL 110 08/30/2014 0953   CL 106 10/23/2012 1459   CO2 26 03/28/2015 1105   CO2 24 08/30/2014 0953   BUN 15.5  03/28/2015 1105   BUN 9 08/30/2014 0953   CREATININE 0.9 03/28/2015 1105   CREATININE 0.67 08/30/2014 0953   CREATININE 0.82 08/18/2011 1056      Component Value Date/Time   CALCIUM 9.5 03/28/2015 1105   CALCIUM 7.3* 08/30/2014 0953   ALKPHOS 1,157* 03/28/2015 1105   ALKPHOS 414* 08/30/2014 0953   AST 179* 03/28/2015 1105   AST 59* 08/30/2014 0953   ALT 112* 03/28/2015 1105   ALT 38* 08/30/2014 0953   BILITOT 0.87 03/28/2015 1105   BILITOT 0.3 08/30/2014 0953       Lab Results  Component Value Date   WBC 5.1 03/28/2015   HGB 11.3* 03/28/2015   HCT 36.1 03/28/2015   MCV 81.9 03/28/2015   PLT 356 03/28/2015   NEUTROABS 3.8 03/28/2015   ASSESSMENT & PLAN:  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 Treated with Ibrance plus letrozole plus X [place] since 03/12/2014. PET/CT 07/22/2014 revealed multiple lymph nodes, multiple liver lesions, adrenal lesions, widespread bone metastases, started Halaven 08/09/2014  PET/CT scan 12/06/2014: Monitor response to therapy with resolution of hypermetabolic nodal metastases within the neck and the chest, improvement to resolution of hypermetabolic liver metastases, resolved right and left adrenal metastases, near complete resolution of widespread bony metastases.  Bone scan 02/14/2015: Marked improvement in the appearance skeleton there remains abnormal uptake in the humeri, femurs and within ribs overall dramatic response to therapy  CT CAP 02/14/2015: Difficult interpretation.? A few lesions within both left and right hepatic lobes appear to be new or increased in size from prior exam,? New sclerotic lesions in humeral heads bilaterally. 6 mm left lower lobe pulmonary nodule  Cutaneous lesions: biopsy proven metastatic breast cancer 01/27/2015. Multiple cutaneous metastases are palpable on the neck ----------------------------------------------------------------------------------------------------------------------------------------------- Treatment plan: Halaven day 1 and day 8 every 3 weeks started 08/09/2014, change to day 1 day 15 every 4 weeks from 01/24/2015 with cycle 9, completed 10 cycles  Worsening AST and ALT and alkaline phosphatase: I'm concerned of progression in the liver. Would like to obtain PET/CT to assess treatment response and determine if she is progressing on Halaven. It could also be a side effect of chemotherapy. Hence we need to obtain a PET CT scan to know what her cancer is  doing.  Blurring of vision and unsteadiness of gait: I would like to obtain a brain MRI. I'm concerned about brain metastases. I will start her on oral dexamethasone 4 mg twice a day.  If there is widespread progression of disease, patient expressed her wishes to not do any aggressive treatments.  Return to clinic after PET/CT scan An brain MRI to discuss further treatment   Orders Placed This Encounter  Procedures  . NM PET Image Restag (PS) Skull Base To Thigh    Standing Status: Future     Number of Occurrences:      Standing Expiration Date: 03/27/2016    Order Specific Question:  Reason for Exam (SYMPTOM  OR DIAGNOSIS REQUIRED)    Answer:  Metastatic breast cancer with worsening symptoms    Order Specific Question:  Preferred imaging location?    Answer:  [facility]    Order Specific Question:  If indicated for the ordered procedure, I authorize the administration of a radiopharmaceutical  per Radiology protocol    Answer:  Yes  . MR Brain W Wo Contrast    Standing Status: Future     Number of Occurrences:      Standing Expiration Date: 03/27/2016    Order Specific Question:  If indicated for the ordered procedure, I authorize the administration of contrast media per Radiology protocol    Answer:  Yes    Order Specific Question:  Reason for Exam (SYMPTOM  OR DIAGNOSIS REQUIRED)    Answer:  Metastatic breast cancer with worsening symptoms    Order Specific Question:  Preferred imaging location?    Answer:  [facility]    Order Specific Question:  Does the patient have a pacemaker or implanted devices?    Answer:  No    Order Specific Question:  What is the patient's sedation requirement?    Answer:  No Sedation   The patient has a good understanding of the overall plan. she agrees with it. she will call with any problems that may develop before the next visit here.   Rulon Eisenmenger, MD 03/28/2015

## 2015-04-01 ENCOUNTER — Telehealth: Payer: Self-pay

## 2015-04-01 ENCOUNTER — Other Ambulatory Visit: Payer: Self-pay

## 2015-04-01 NOTE — Telephone Encounter (Signed)
Provided patient with all appointments and locations for PET, MRI and follow up OV.  Pt voiced understanding.

## 2015-04-03 ENCOUNTER — Encounter: Payer: Self-pay | Admitting: Hematology and Oncology

## 2015-04-03 ENCOUNTER — Ambulatory Visit
Admission: RE | Admit: 2015-04-03 | Discharge: 2015-04-03 | Disposition: A | Discharge status: Home / Self Care | Payer: 59 | Source: Ambulatory Visit | Attending: Hematology and Oncology | Admitting: Hematology and Oncology

## 2015-04-03 DIAGNOSIS — C50111 Malignant neoplasm of central portion of right female breast: Secondary | ICD-10-CM | POA: Diagnosis present

## 2015-04-03 DIAGNOSIS — C7951 Secondary malignant neoplasm of bone: Secondary | ICD-10-CM | POA: Insufficient documentation

## 2015-04-03 DIAGNOSIS — Z0189 Encounter for other specified special examinations: Secondary | ICD-10-CM | POA: Diagnosis present

## 2015-04-03 DIAGNOSIS — D492 Neoplasm of unspecified behavior of bone, soft tissue, and skin: Secondary | ICD-10-CM | POA: Diagnosis not present

## 2015-04-03 DIAGNOSIS — C787 Secondary malignant neoplasm of liver and intrahepatic bile duct: Secondary | ICD-10-CM | POA: Insufficient documentation

## 2015-04-03 LAB — GLUCOSE, CAPILLARY: GLUCOSE-CAPILLARY: 78 mg/dL (ref 65–99)

## 2015-04-03 MED ORDER — FLUDEOXYGLUCOSE F - 18 (FDG) INJECTION
12.1300 | Freq: Once | INTRAVENOUS | Status: DC | PRN
  Administered 2015-04-03: 12.13 via INTRAVENOUS
  Filled 2015-04-03: qty 12.13

## 2015-04-03 NOTE — Progress Notes (Signed)
Faxed medical notes from March 2016 to current to Shelly Rubenstein [phone]. Advised patient medical notes faxed on 04/02/15.

## 2015-04-04 ENCOUNTER — Ambulatory Visit: Payer: 59 | Admitting: Hematology and Oncology

## 2015-04-04 ENCOUNTER — Other Ambulatory Visit: Payer: 59

## 2015-04-07 ENCOUNTER — Other Ambulatory Visit: Payer: Self-pay | Admitting: *Deleted

## 2015-04-07 ENCOUNTER — Other Ambulatory Visit: Payer: Self-pay | Admitting: Oncology

## 2015-04-07 MED ORDER — OXYCODONE-ACETAMINOPHEN 5-325 MG PO TABS: 1.0000 | ORAL_TABLET | Freq: Four times a day (QID) | ORAL | Status: DC | PRN

## 2015-04-08 ENCOUNTER — Other Ambulatory Visit: Payer: Self-pay | Admitting: *Deleted

## 2015-04-08 DIAGNOSIS — C50111 Malignant neoplasm of central portion of right female breast: Secondary | ICD-10-CM

## 2015-04-08 NOTE — Assessment & Plan Note (Signed)
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 Treated with Ibrance plus letrozole plus X Geva since 03/12/2014. PET/CT 07/22/2014 revealed multiple lymph nodes, multiple liver lesions, adrenal lesions, widespread bone metastases, started Halaven 08/09/2014  PET/CT scan 12/06/2014: Monitor response to therapy with resolution of hypermetabolic nodal metastases within the neck and the chest, improvement to resolution of hypermetabolic liver metastases, resolved right and left adrenal metastases, near complete resolution of widespread bony metastases.  Bone scan 02/14/2015: Marked improvement in the appearance skeleton there remains abnormal uptake in the humeri, femurs and within ribs overall dramatic response to therapy  CT CAP 02/14/2015: Difficult interpretation.? A few lesions within both left and right hepatic lobes appear to be new or increased in size from prior exam,? New sclerotic lesions in humeral heads bilaterally. 6 mm left lower lobe pulmonary nodule  Cutaneous lesions: biopsy proven metastatic breast cancer 01/27/2015. Multiple cutaneous metastases are palpable on the neck ----------------------------------------------------------------------------------------------------------------------------------------------- Treatment plan: Halaven day 1 and day 8 every 3 weeks started 08/09/2014, change to day 1 day 15 every 4 weeks from 01/24/2015 with cycle 9, completed 10 cycles PET CT scan 04/03/2015: Marked progression of disease. All segments of the liver are involved with diffuse hepatic metastatic disease. Scattered bone metastases visualized appendicular and axial skeleton, multiple subcutaneous nodules chest, intramuscular foci, several were  new. Brain MRI 04/09/2015  Prognosis: I discussed with her that such a dramatic progression of disease indicates a very poor prognosis. I discussed with her different options including third line treatment with either Xeloda or gemcitabine versus palliative care with hospice. I anticipate that she has very short life expectancy in a matter of few months. I discussed with her that she will need to get her affairs in order and discussed this with her family in detail.

## 2015-04-09 ENCOUNTER — Telehealth: Payer: Self-pay | Admitting: Hematology and Oncology

## 2015-04-09 ENCOUNTER — Ambulatory Visit (HOSPITAL_COMMUNITY): Admission: RE | Admit: 2015-04-09 | Payer: 59 | Source: Ambulatory Visit

## 2015-04-09 ENCOUNTER — Other Ambulatory Visit: Payer: 59

## 2015-04-09 ENCOUNTER — Ambulatory Visit: Payer: 59 | Admitting: Hematology and Oncology

## 2015-04-09 ENCOUNTER — Ambulatory Visit (HOSPITAL_COMMUNITY): Payer: 59

## 2015-04-09 NOTE — Telephone Encounter (Signed)
Patient had called and left a message this morning but the voicemail did not come through until this afternoon,she missed her appointment this morning with dr Lindi Adie as she cannot see well and did not have a ride,per dian she called dr Lindi Adie and he advised to cancel her tx on 11/25 and to see her on 11/28.  If she calls and can get an earlier ride on 11/28 we will work her ion     [employer]

## 2015-04-11 ENCOUNTER — Ambulatory Visit: Payer: 59

## 2015-04-12 ENCOUNTER — Emergency Department (HOSPITAL_COMMUNITY)
Admission: EM | Admit: 2015-04-12 | Discharge: 2015-04-12 | Disposition: A | Discharge status: Home / Self Care | Payer: 59 | Attending: Emergency Medicine | Admitting: Emergency Medicine

## 2015-04-12 ENCOUNTER — Encounter (HOSPITAL_COMMUNITY): Payer: Self-pay

## 2015-04-12 ENCOUNTER — Emergency Department (HOSPITAL_COMMUNITY): Payer: 59

## 2015-04-12 DIAGNOSIS — C50911 Malignant neoplasm of unspecified site of right female breast: Secondary | ICD-10-CM | POA: Diagnosis not present

## 2015-04-12 DIAGNOSIS — C719 Malignant neoplasm of brain, unspecified: Secondary | ICD-10-CM | POA: Diagnosis not present

## 2015-04-12 DIAGNOSIS — H538 Other visual disturbances: Secondary | ICD-10-CM | POA: Insufficient documentation

## 2015-04-12 DIAGNOSIS — R112 Nausea with vomiting, unspecified: Secondary | ICD-10-CM | POA: Diagnosis not present

## 2015-04-12 DIAGNOSIS — K5792 Diverticulitis of intestine, part unspecified, without perforation or abscess without bleeding: Secondary | ICD-10-CM | POA: Diagnosis not present

## 2015-04-12 DIAGNOSIS — Z880 Allergy status to penicillin: Secondary | ICD-10-CM | POA: Insufficient documentation

## 2015-04-12 DIAGNOSIS — C799 Secondary malignant neoplasm of unspecified site: Secondary | ICD-10-CM | POA: Insufficient documentation

## 2015-04-12 DIAGNOSIS — Z8742 Personal history of other diseases of the female genital tract: Secondary | ICD-10-CM | POA: Diagnosis not present

## 2015-04-12 DIAGNOSIS — R531 Weakness: Secondary | ICD-10-CM | POA: Diagnosis present

## 2015-04-12 DIAGNOSIS — Z79899 Other long term (current) drug therapy: Secondary | ICD-10-CM | POA: Diagnosis not present

## 2015-04-12 DIAGNOSIS — C7931 Secondary malignant neoplasm of brain: Secondary | ICD-10-CM

## 2015-04-12 LAB — CBC WITH DIFFERENTIAL/PLATELET
BASOS ABS: 0 10*3/uL (ref 0.0–0.1)
Basophils Relative: 0 %
EOS PCT: 1 %
Eosinophils Absolute: 0 10*3/uL (ref 0.0–0.7)
HEMATOCRIT: 34.9 % — AB (ref 36.0–46.0)
Hemoglobin: 10.9 g/dL — ABNORMAL LOW (ref 12.0–15.0)
LYMPHS ABS: 0.7 10*3/uL (ref 0.7–4.0)
LYMPHS PCT: 11 %
MCH: 26.1 pg (ref 26.0–34.0)
MCHC: 31.2 g/dL (ref 30.0–36.0)
MCV: 83.5 fL (ref 78.0–100.0)
MONO ABS: 0.7 10*3/uL (ref 0.1–1.0)
Monocytes Relative: 11 %
NEUTROS ABS: 5 10*3/uL (ref 1.7–7.7)
Neutrophils Relative %: 77 %
PLATELETS: 305 10*3/uL (ref 150–400)
RBC: 4.18 MIL/uL (ref 3.87–5.11)
RDW: 20.2 % — AB (ref 11.5–15.5)
WBC: 6.4 10*3/uL (ref 4.0–10.5)

## 2015-04-12 LAB — COMPREHENSIVE METABOLIC PANEL
ALBUMIN: 3.1 g/dL — AB (ref 3.5–5.0)
ALT: 79 U/L — AB (ref 14–54)
AST: 197 U/L — AB (ref 15–41)
Alkaline Phosphatase: 1433 U/L — ABNORMAL HIGH (ref 38–126)
Anion gap: 11 (ref 5–15)
BUN: 16 mg/dL (ref 6–20)
CHLORIDE: 101 mmol/L (ref 101–111)
CO2: 28 mmol/L (ref 22–32)
CREATININE: 0.79 mg/dL (ref 0.44–1.00)
Calcium: 9.3 mg/dL (ref 8.9–10.3)
GFR calc Af Amer: >60 mL/min (ref >60)
Glucose, Bld: 154 mg/dL — ABNORMAL HIGH (ref 65–99)
POTASSIUM: 4 mmol/L (ref 3.5–5.1)
SODIUM: 140 mmol/L (ref 135–145)
Total Bilirubin: 1.2 mg/dL (ref 0.3–1.2)
Total Protein: 7 g/dL (ref 6.5–8.1)

## 2015-04-12 LAB — URINE MICROSCOPIC-ADD ON: RBC / HPF: NONE SEEN RBC/hpf (ref 0–5)

## 2015-04-12 LAB — URINALYSIS, ROUTINE W REFLEX MICROSCOPIC
Bilirubin Urine: NEGATIVE
GLUCOSE, UA: NEGATIVE mg/dL
HGB URINE DIPSTICK: NEGATIVE
Ketones, ur: NEGATIVE mg/dL
Nitrite: NEGATIVE
PROTEIN: NEGATIVE mg/dL
Specific Gravity, Urine: 1.015 (ref 1.005–1.030)
pH: 5.5 (ref 5.0–8.0)

## 2015-04-12 LAB — LIPASE, BLOOD: LIPASE: 25 U/L (ref 11–51)

## 2015-04-12 MED ORDER — SODIUM CHLORIDE 0.9 % IV BOLUS (SEPSIS)
1000.0000 mL | Freq: Once | INTRAVENOUS | Status: AC
  Administered 2015-04-12: 1000 mL via INTRAVENOUS

## 2015-04-12 MED ORDER — ONDANSETRON HCL 4 MG/2ML IJ SOLN
4.0000 mg | Freq: Once | INTRAMUSCULAR | Status: DC
  Filled 2015-04-12: qty 2

## 2015-04-12 NOTE — ED Notes (Signed)
Patient transported to CT 

## 2015-04-12 NOTE — ED Provider Notes (Signed)
CSN: [id number]     Arrival date & time 04/12/15  1147 History   First MD Initiated Contact with Patient 04/12/15 1241     Chief Complaint  Patient presents with  . Emesis  . Weakness     (Consider location/radiation/quality/duration/timing/severity/associated sxs/prior Treatment) HPI Comments: 59 year old female with metastatic breast cancer, diverticulitis who presents with nausea and weakness. The patient states that yesterday when she was in the car she began having nausea and vomiting. She does note a history of motion sickness. Since yesterday evening she has had persistent nausea and today has felt generally weak. She has had several days of blurry vision but denies any loss of vision. No headaches or neck pain. No diarrhea, blood in her stool, or urinary symptoms. No fevers or cough/cold symptoms. No focal weakness.  Patient is a 59 y.o. female presenting with vomiting and weakness. The history is provided by the patient.  Emesis Weakness    Past Medical History  Diagnosis Date  . Ovarian cyst   . Breast calcification, left   . Diverticulitis     when it flares pt. uses Probiotic, also use baking soda & honey everyday   . Breast cancer ([place])     "right" (11/07/2012)  . S/P radiation therapy 09/26/13-11/08/13    right breast   Past Surgical History  Procedure Laterality Date  . Mastectomy modified radical Right 11/07/2012  . Vaginal hysterectomy  2012  . Vaginal delivery      x2  . 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 Breast biopsy Right 10/2012  . Mastectomy modified radical Right 11/07/2012    Procedure: MASTECTOMY MODIFIED RADICAL;  Surgeon: Adin Hector, MD;  Location: [facility];  Service: General;  Laterality: Right;  . Simple excision Right 09/03/2013    simple in office excision of mass right axilla   Family History  Problem Relation Age of Onset  . Cancer Mother     ovarian  . Cancer Brother     bone   . Colon cancer Neg Hx    Social History  Substance Use Topics  . Smoking status: Never Smoker   . Smokeless tobacco: Never Used  . Alcohol Use: No   OB History    Gravida Para Term Preterm AB TAB SAB Ectopic Multiple Living   3 2 2  1  1   2      Review of Systems  Gastrointestinal: Positive for vomiting.  Neurological: Positive for weakness.   10 Systems reviewed and are negative for acute change except as noted in the HPI.    Allergies  Penicillins  Home Medications   Prior to Admission medications   Medication Sig Start Date End Date Taking? Authorizing Provider  Calcium Carbonate-Vit D-Min (CALCIUM 1200 PO) Take 1 tablet by mouth daily at 12 noon.    Yes Historical Provider, MD  ibuprofen (ADVIL,MOTRIN) 200 MG tablet Take 200 mg by mouth every 4 (four) hours as needed for moderate pain.   Yes Historical Provider, MD  lidocaine-prilocaine (EMLA) cream Apply to affected area once 07/26/14  Yes Nicholas Lose, MD  LORazepam (ATIVAN) 0.5 MG tablet TAKE 1 TABLET EVERY 6 HOURS AS NEEDED Patient taking differently: TAKE 1 TABLET EVERY 6 HOURS AS NEEDED ANXEITY 08/09/14  Yes Nicholas Lose, MD  Multiple Vitamins-Minerals (MULTIVITAMIN PO) Take 1 tablet by mouth daily at 12 noon.    Yes Historical Provider, MD  OVER THE COUNTER MEDICATION Take 4 tablets  by mouth 2 (two) times daily. ([place])   Yes Historical Provider, MD  oxyCODONE-acetaminophen (PERCOCET/ROXICET) 5-325 MG tablet Take 1-2 tablets by mouth every 6 (six) hours as needed. Patient taking differently: Take 1-2 tablets by mouth every 6 (six) hours as needed for moderate pain.  04/07/15  Yes Nicholas Lose, MD  prochlorperazine (COMPAZINE) 10 MG tablet Take 1 tablet (10 mg total) by mouth every 6 (six) hours as needed (Nausea or vomiting). 07/26/14  Yes Nicholas Lose, MD  Alum & Mag Hydroxide-Simeth (MAGIC MOUTHWASH W/LIDOCAINE) SOLN Take 5 mLs by mouth 4 (four) times daily as needed for mouth pain. Patient not taking:  Reported on 04/12/2015 08/16/14   Laurie Panda, NP  dexamethasone (DECADRON) 4 MG tablet Take 1 tablet (4 mg total) by mouth 2 (two) times daily. Patient not taking: Reported on 04/12/2015 03/28/15   Nicholas Lose, MD  ondansetron (ZOFRAN) 8 MG tablet Take 1 tablet (8 mg total) by mouth 2 (two) times daily. The day after chemo for 2 days, then every 8 hours PRN for N/V Patient not taking: Reported on 04/12/2015 07/26/14   Nicholas Lose, MD  UNABLE TO FIND Q000111Q Silicone Breast Prosthesis, Rt quantity 1 L8020 Mastectomy form, Rt, quantity 1 L8000 Mastectomy bra, quantity 6    Historical Provider, MD  UNABLE TO FIND 174.9 Malignant Neoplasm   Mastectomy Right  L8030-Silicone Breast Prosthesis-1  L8000- Mastectomy Bra-2 11/01/13   Fanny Skates, MD   BP 157/86 mmHg  Pulse 85  Temp(Src) 98.3 F (36.8 C) (Oral)  Resp 20  SpO2 95% Physical Exam  Constitutional: She is oriented to person, place, and time. She appears well-developed and well-nourished. No distress.  HENT:  Head: Normocephalic and atraumatic.  Moist mucous membranes  Eyes: Conjunctivae are normal. Pupils are equal, round, and reactive to light.  Neck: Neck supple.  Cardiovascular: Normal rate, regular rhythm and normal heart sounds.   No murmur heard. Pulmonary/Chest: Effort normal and breath sounds normal.  Abdominal: Soft. Bowel sounds are normal. She exhibits no distension. There is no tenderness.  Musculoskeletal: She exhibits no edema.  Neurological: She is alert and oriented to person, place, and time. No cranial nerve deficit. She exhibits normal muscle tone. Coordination normal.  Fluent speech, normal gait, normal finger to nose testing, negative pronator drift  Skin: Skin is warm and dry.  Psychiatric: She has a normal mood and affect. Judgment normal.  Nursing note and vitals reviewed.   ED Course  Procedures (including critical care time) Labs Review Labs Reviewed  COMPREHENSIVE METABOLIC PANEL -  Abnormal; Notable for the following:    Glucose, Bld 154 (*)    Albumin 3.1 (*)    AST 197 (*)    ALT 79 (*)    Alkaline Phosphatase 1433 (*)    All other components within normal limits  CBC WITH DIFFERENTIAL/PLATELET - Abnormal; Notable for the following:    Hemoglobin 10.9 (*)    HCT 34.9 (*)    RDW 20.2 (*)    All other components within normal limits  URINALYSIS, ROUTINE W REFLEX MICROSCOPIC (NOT AT [facility]) - Abnormal; Notable for the following:    APPearance CLOUDY (*)    Leukocytes, UA TRACE (*)    All other components within normal limits  URINE MICROSCOPIC-ADD ON - Abnormal; Notable for the following:    Squamous Epithelial / LPF 0-5 (*)    Bacteria, UA RARE (*)    All other components within normal limits  LIPASE, BLOOD  Imaging Review Ct Head Wo Contrast  04/12/2015  CLINICAL DATA:  Nausea and vomiting with blurry vision. History of breast cancer. EXAM: CT HEAD WITHOUT CONTRAST TECHNIQUE: Contiguous axial images were obtained from the base of the skull through the vertex without intravenous contrast. COMPARISON:  None FINDINGS: There is a round hypodense focus within the LEFT aspect of the pons measuring 10 mm on image 9, series 2. Second 11 mm hypodense focus within the deep RIGHT temporal lobe adjacent to the quadrigeminal plate cistern (image 12, series 2). No intracranial hemorrhage. Ventricles are normal volume. No hydrocephalus. No midline shift. There is cisterns are patent. There multiple subcutaneous nodules within the scalp. No evidence of calvarial metastasis. IMPRESSION: 1. Two hyperdense foci: one within the deep RIGHT temporal lobe and a second within the brainstem are concerning for intracranial metastasis. 2. No midline shift or mass effect. 3. Recommend brain MRI with contrast for further evaluation. Electronically Signed   By: Suzy Bouchard M.D.   On: 04/12/2015 14:14   I have personally reviewed and evaluated these lab results as part of my medical  decision-making.   EKG Interpretation   Date/Time:  Saturday April 12 2015 13:40:29 EST Ventricular Rate:  72 PR Interval:  188 QRS Duration: 99 QT Interval:  415 QTC Calculation: 454 R Axis:   46 Text Interpretation:  Sinus rhythm Low voltage, precordial leads RSR' in  V1 or V2, probably normal variant No previous ECGs available Confirmed by  LITTLE MD, RACHEL [phone]) on 04/12/2015 2:04:10 PM     Medications  ondansetron (ZOFRAN) injection 4 mg (4 mg Intravenous Not Given 04/12/15 1328)  sodium chloride 0.9 % bolus 1,000 mL (0 mLs Intravenous Stopped 04/12/15 1404)    MDM   Final diagnoses:  Brain metastases ([place])  Weakness  Blurry vision   59 year old female with metastatic breast cancer who presents with 1 day of nausea and vomiting and generalized weakness. On exam, patient nontoxic with normal vital signs. No focal neurologic deficits. Obtained above labs as well as EKG to evaluate for weakness and obtained head CT to evaluate blurry vision.  EKG unremarkable and labs notable for elevated AST and ALT as well as alkaline phosphatase. Stable anemia. I reviewed the patient's chart which shows a recent visit on 11/11 with her oncologist. At that time, her LFTs were elevated, suspicion for worsening liver involvement of disease. Head CT showed 2 foci, right temporal lobe and brainstem, concerning for metastases. No evidence of midline shift or mass effect. The patient was given Decadron at her last appointment due to concern for metastatic brain lesions. I discussed with the on-call oncologist, Dr. Whitney Muse, who recommended continuing steroids and nausea medications and contacting her oncologist on Monday morning. Given patient stable in ED w/ no intractable vomiting, she did not recommend admission for CT findings. Discussed results and treatment plan with the patient and her family. She states that she will be compliant with Decadron. She already has antiemetics at home. Discussed  precautions including no driving, swimming alone, or climbing heights due to risk for seizures. Patient voiced understanding and was discharged in satisfactory condition after successful PO challenge w/ juice.   Sharlett Iles, MD 04/12/15 [phone]

## 2015-04-12 NOTE — ED Notes (Signed)
Pt presents with c/o vomiting and weakness. Pt reports she was traveling in the car yesterday and began vomiting yesterday as well. Pt reports hx of motion sickness. Pt reports she is also a breast cancer patient, reporting some weakness today.

## 2015-04-14 ENCOUNTER — Telehealth: Payer: Self-pay | Admitting: Hematology and Oncology

## 2015-04-14 ENCOUNTER — Other Ambulatory Visit (HOSPITAL_BASED_OUTPATIENT_CLINIC_OR_DEPARTMENT_OTHER): Payer: 59

## 2015-04-14 ENCOUNTER — Other Ambulatory Visit: Payer: Self-pay | Admitting: Radiation Therapy

## 2015-04-14 ENCOUNTER — Encounter: Payer: Self-pay | Admitting: Radiation Therapy

## 2015-04-14 ENCOUNTER — Ambulatory Visit (HOSPITAL_BASED_OUTPATIENT_CLINIC_OR_DEPARTMENT_OTHER): Payer: 59 | Admitting: Hematology and Oncology

## 2015-04-14 ENCOUNTER — Encounter: Payer: Self-pay | Admitting: Hematology and Oncology

## 2015-04-14 VITALS — BP 123/68 | HR 87 | Temp 98.0°F | Resp 18 | Ht 65.0 in | Wt 146.0 lb

## 2015-04-14 DIAGNOSIS — C7951 Secondary malignant neoplasm of bone: Secondary | ICD-10-CM | POA: Diagnosis not present

## 2015-04-14 DIAGNOSIS — C50111 Malignant neoplasm of central portion of right female breast: Secondary | ICD-10-CM

## 2015-04-14 DIAGNOSIS — C7931 Secondary malignant neoplasm of brain: Secondary | ICD-10-CM | POA: Diagnosis not present

## 2015-04-14 DIAGNOSIS — C792 Secondary malignant neoplasm of skin: Secondary | ICD-10-CM | POA: Diagnosis not present

## 2015-04-14 LAB — COMPREHENSIVE METABOLIC PANEL (CC13)
ALT: 84 U/L — AB (ref 0–55)
ANION GAP: 11 meq/L (ref 3–11)
AST: 227 U/L (ref 5–34)
Albumin: 2.6 g/dL — ABNORMAL LOW (ref 3.5–5.0)
Alkaline Phosphatase: 2058 U/L — ABNORMAL HIGH (ref 40–150)
BILIRUBIN TOTAL: 2.05 mg/dL — AB (ref 0.20–1.20)
BUN: 14.7 mg/dL (ref 7.0–26.0)
CALCIUM: 9.5 mg/dL (ref 8.4–10.4)
CO2: 26 meq/L (ref 22–29)
CREATININE: 0.9 mg/dL (ref 0.6–1.1)
Chloride: 99 mEq/L (ref 98–109)
EGFR: 74 mL/min/{1.73_m2} — ABNORMAL LOW (ref >90)
Glucose: 127 mg/dl (ref 70–140)
Potassium: 4.1 mEq/L (ref 3.5–5.1)
Sodium: 136 mEq/L (ref 136–145)
TOTAL PROTEIN: 7 g/dL (ref 6.4–8.3)

## 2015-04-14 LAB — CBC WITH DIFFERENTIAL/PLATELET
BASO%: 0.1 % (ref 0.0–2.0)
Basophils Absolute: 0 10*3/uL (ref 0.0–0.1)
EOS%: 0.1 % (ref 0.0–7.0)
Eosinophils Absolute: 0 10*3/uL (ref 0.0–0.5)
HEMATOCRIT: 35.2 % (ref 34.8–46.6)
HEMOGLOBIN: 10.9 g/dL — AB (ref 11.6–15.9)
LYMPH#: 0.7 10*3/uL — AB (ref 0.9–3.3)
LYMPH%: 9.4 % — ABNORMAL LOW (ref 14.0–49.7)
MCH: 25.8 pg (ref 25.1–34.0)
MCHC: 31 g/dL — ABNORMAL LOW (ref 31.5–36.0)
MCV: 83.2 fL (ref 79.5–101.0)
MONO#: 0.9 10*3/uL (ref 0.1–0.9)
MONO%: 12.6 % (ref 0.0–14.0)
NEUT%: 77.8 % — ABNORMAL HIGH (ref 38.4–76.8)
NEUTROS ABS: 5.4 10*3/uL (ref 1.5–6.5)
PLATELETS: 234 10*3/uL (ref 145–400)
RBC: 4.23 10*6/uL (ref 3.70–5.45)
RDW: 20.5 % — AB (ref 11.2–14.5)
WBC: 6.9 10*3/uL (ref 3.9–10.3)

## 2015-04-14 NOTE — Telephone Encounter (Signed)
Gave and printed appts ched and avs for pt for DEC  °

## 2015-04-14 NOTE — Assessment & Plan Note (Signed)
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 Treated with Ibrance plus letrozole plus X [place] since 03/12/2014. PET/CT 07/22/2014 revealed multiple lymph nodes, multiple liver lesions, adrenal lesions, widespread bone metastases, started Halaven 08/09/2014  PET/CT scan 12/06/2014: Monitor response to therapy with resolution of hypermetabolic nodal metastases within the neck and the chest, improvement to resolution of hypermetabolic liver metastases, resolved right and left adrenal metastases, near complete resolution of widespread bony metastases.  Bone scan 02/14/2015: Marked improvement in the appearance skeleton there remains abnormal uptake in the humeri, femurs and within ribs overall dramatic response to therapy  CT CAP 02/14/2015: Difficult interpretation.? A few lesions within both left and right hepatic lobes appear to be new or increased in size from prior exam,? New sclerotic lesions in humeral heads bilaterally. 6 mm left lower lobe pulmonary nodule  Cutaneous lesions: biopsy proven metastatic breast cancer 01/27/2015. Multiple cutaneous metastases are palpable on the neck ----------------------------------------------------------------------------------------------------------------------------------------------- Treatment plan: Halaven day 1 and day 8 every 3 weeks started 08/09/2014, change to day 1 day 15 every 4 weeks from 01/24/2015 with cycle 9, completed 10 cycles  PET CT scan 04/03/2015: Moderate progression of hypermetabolic metastatic disease with heavy geographic metastatic disease throughout the liver, scattered subcutaneous and intramuscular hypermetabolic nodules in the neck and chest, multifocal diffuse hypermetabolic  metastatic disease to the axial and appendicular skeleton increased from before.  CT brain: [place] 26 2016:2 hyperdense foci one within the deep right temporal lobe and second within the brainstem concerning for intracranial metastasis.  Recommendation: 1. Continue with oral dexamethasone 2. Radiation oncology referral for palliative radiation to brain and bones 3. I also discussed different options including palliative chemotherapy with either gemcitabine or Abraxane or Xeloda 4. The other option would be to consider hospice care because I believe her disease is rapidly progressing with multiple cutaneous metastases and newly found brain metastases. Her prognosis is very poor. 5. CODE STATUS: DO NOT RESUSCITATE CC

## 2015-04-14 NOTE — Progress Notes (Signed)
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 ([name])   11/09/2012 Surgery 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 one out of 13 lymph nodes positive ER 100%, PR 29%, HER-2 negative ratio 0.93 Ki-67 89%   09/26/2013 - 11/08/2013 Radiation Therapy Radiation to right chest wall and right supraclavicular region to a dose of 50.4 gray at 1.8 gray per fraction    Anti-estrogen oral therapy Patient refused antiestrogen therapy and chemotherapy, receives Blue crystal treatment from [place] (patch that sucks out impurities)   02/21/2014 PET scan Diffuse bone metastases involving the axial and appendicular skeleton, biopsy of the left iliac bone positive for metastatic breast cancer   03/12/2014 Treatment Plan Change Ibrance with letrozole and Xgeva    07/22/2014 PET scan New hypermetabolic lymph nodes in the neck and chest, new abnormalities in the liver without underlying CT imaging abnormality, both adrenal glands are hypermetabolic widespread bony metastases as before   08/09/2014 -  Chemotherapy Halaven day 1, day 8 every 3 weeks   10/04/2014 Initial Biopsy Bone marrow biopsy showing extensive infiltration of breast cancer in the bone marrow; CT chest abdomen and pelvis after 3 cycles of Halaven: reveals stable bone disease and stable liver lesions   12/06/2014 PET scan  resolution of  nodal metastases within the neck and the chest, Resolution of hypermetabolic liver metastases, resolved right and left adrenal metastases, near complete resolution of widespread bony metastases.   01/27/2015 Procedure subcutaneous nodule excisional biopsy: Metastatic breast cancer   02/13/2015 Imaging CTs and bone scan: Decreased number of metastatic lesions in the liver, however a few lesions were new or increased. New sclerotic lesions in the humeral heads bilaterally, 6 mm left lower lobe lung nodule   04/03/2015 PET scan Marked progression of metastatic disease throughout the liver, scattered subcutaneous and intramuscular nodules in the neck and chest, multifocal diffuse bone metastasis in axial and appendicular skeleton    CHIEF COMPLIANT: follow-up CT scan and brain CT  INTERVAL HISTORY: [name] is a 59 year old with above-mentioned history of metastatic breast cancer who was on Halaven and underwent PET/CT scan on 04/03/2015. She was also having gait changes and a recommended that she get a brain MRI. Meanwhile she went to the emergency room or the weekend and had a CT of her head which showed 2 solitary brain metastases. She continues to feel gait imbalance. She is here today accompanied by her friend to discuss the results of the scan. She has a son and her daughter who are not with her today. She also complains of low back pain.  REVIEW OF SYSTEMS:   Constitutional: Denies fevers, chills or abnormal weight loss Eyes: Denies blurriness of vision Ears, nose, mouth, throat, and face: Denies mucositis or sore throat Respiratory: Denies cough, dyspnea or wheezes Cardiovascular: Denies palpitation, chest discomfort or lower extremity swelling  Gastrointestinal:  Denies nausea, heartburn or change in bowel habits Skin: Denies abnormal skin rashes Lymphatics: Denies new lymphadenopathy or easy bruising Neurological:gait imbalance Behavioral/Psych: Mood is stable, no new  changes  All other systems were reviewed with the patient and are negative.  I have reviewed the past medical history, past surgical history, social history and family history with the patient and they are unchanged from previous note.  ALLERGIES:  is allergic to penicillins.  MEDICATIONS:  Current Outpatient Prescriptions  Medication Sig Dispense Refill  . Alum & Mag Hydroxide-Simeth (MAGIC MOUTHWASH W/LIDOCAINE) SOLN Take 5 mLs by mouth 4 (four) times daily as needed for mouth pain. (Patient not taking: Reported on 04/12/2015) 120 mL 0  . Calcium Carbonate-Vit D-Min (CALCIUM 1200 PO) Take 1 tablet by mouth daily at 12 noon.     Marland Kitchen dexamethasone (DECADRON) 4 MG tablet Take 1 tablet (4 mg total) by mouth 2 (two) times daily. (Patient not taking: Reported on 04/12/2015) 60 tablet 0  . ibuprofen (ADVIL,MOTRIN) 200 MG tablet Take 200 mg by mouth every 4 (four) hours as needed for moderate pain.    Marland Kitchen lidocaine-prilocaine (EMLA) cream Apply to affected area once 30 g 3  . LORazepam (ATIVAN) 0.5 MG tablet TAKE 1 TABLET EVERY 6 HOURS AS NEEDED (Patient taking differently: TAKE 1 TABLET EVERY 6 HOURS AS NEEDED ANXEITY) 30 tablet 0  . Multiple Vitamins-Minerals (MULTIVITAMIN PO) Take 1 tablet by mouth daily at 12 noon.     . ondansetron (ZOFRAN) 8 MG tablet Take 1 tablet (8 mg total) by mouth 2 (two) times daily. The day after chemo for 2 days, then every 8 hours PRN for N/V (Patient not taking: Reported on 04/12/2015) 30 tablet 1  . OVER THE COUNTER MEDICATION Take 4 tablets by mouth 2 (two) times daily. ([place])    . oxyCODONE-acetaminophen (PERCOCET/ROXICET) 5-325 MG tablet Take 1-2 tablets by mouth every 6 (six) hours as needed. (Patient taking differently: Take 1-2 tablets by mouth every 6 (six) hours as needed for moderate pain. ) 60 tablet 0  . prochlorperazine (COMPAZINE) 10 MG tablet Take 1 tablet (10 mg total) by mouth every 6 (six) hours as needed (Nausea or vomiting). 30 tablet 1  .  UNABLE TO FIND E2336 Silicone Breast Prosthesis, Rt quantity 1 L8020 Mastectomy form, Rt, quantity 1 L8000 Mastectomy bra, quantity 6    . UNABLE TO FIND 174.9 Malignant Neoplasm   Mastectomy Right  L8030-Silicone Breast Prosthesis-1  L8000- Mastectomy Bra-2 1 each 0   No current facility-administered medications for this visit.    PHYSICAL EXAMINATION: ECOG PERFORMANCE STATUS: 2 - Symptomatic, <50% confined to bed  Filed Vitals:   04/14/15 1250  BP: 123/68  Pulse: 87  Temp: 98 F (36.7 C)  Resp: 18   Filed Weights   04/14/15 1250  Weight: 146 lb (66.225 kg)    GENERAL:alert, no distress and comfortable SKIN: skin color, texture, turgor are normal, no rashes or significant lesions EYES: normal, Conjunctiva are pink and non-injected, sclera clear OROPHARYNX:no exudate, no erythema and lips, buccal mucosa, and tongue normal  NECK: supple, thyroid normal size, non-tender, without nodularity LYMPH:  no palpable lymphadenopathy in the cervical, axillary or inguinal LUNGS: clear to auscultation and percussion with normal breathing effort HEART: regular rate & rhythm and no murmurs and no lower extremity edema ABDOMEN:abdomen soft, non-tender and normal bowel sounds Musculoskeletal:no cyanosis of digits and no clubbing  NEURO: alert & oriented x 3 with fluent speech, moves all extremities normally  but has gait imbalance.  LABORATORY DATA:  I have reviewed the data as listed   Chemistry      Component Value Date/Time   NA 136 04/14/2015 1241   NA 140 04/12/2015 1248   K 4.1 04/14/2015 1241   K 4.0 04/12/2015 1248   CL 101 04/12/2015 1248   CL 106 10/23/2012 1459   CO2 26 04/14/2015 1241   CO2 28 04/12/2015 1248   BUN 14.7 04/14/2015 1241   BUN 16 04/12/2015 1248   CREATININE 0.9 04/14/2015 1241   CREATININE 0.79 04/12/2015 1248   CREATININE 0.82 08/18/2011 1056      Component Value Date/Time   CALCIUM 9.5 04/14/2015 1241   CALCIUM 9.3 04/12/2015 1248   ALKPHOS  2,058* 04/14/2015 1241   ALKPHOS 1433* 04/12/2015 1248   AST 227* 04/14/2015 1241   AST 197* 04/12/2015 1248   ALT 84* 04/14/2015 1241   ALT 79* 04/12/2015 1248   BILITOT 2.05* 04/14/2015 1241   BILITOT 1.2 04/12/2015 1248       Lab Results  Component Value Date   WBC 6.9 04/14/2015   HGB 10.9* 04/14/2015   HCT 35.2 04/14/2015   MCV 83.2 04/14/2015   PLT 234 04/14/2015   NEUTROABS 5.4 04/14/2015   ASSESSMENT & PLAN:  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 Treated with Ibrance plus letrozole plus X [place] since 03/12/2014. PET/CT 07/22/2014 revealed multiple lymph nodes, multiple liver lesions, adrenal lesions, widespread bone metastases, started Halaven 08/09/2014  PET/CT scan 12/06/2014: Monitor response to therapy with resolution of hypermetabolic nodal metastases within the neck and the chest, improvement to resolution of hypermetabolic liver metastases, resolved right and left adrenal metastases, near complete resolution of widespread bony metastases.  Bone scan 02/14/2015: Marked improvement in the appearance skeleton there remains abnormal uptake in the humeri, femurs and within ribs overall dramatic response to therapy  CT CAP 02/14/2015: Difficult interpretation.? A few lesions within both left and right hepatic lobes appear to be new or increased in size from prior exam,? New sclerotic lesions in humeral heads bilaterally. 6 mm left lower lobe pulmonary nodule  Cutaneous lesions: biopsy proven metastatic breast cancer 01/27/2015. Multiple cutaneous metastases are palpable on the  neck ----------------------------------------------------------------------------------------------------------------------------------------------- Treatment plan: Halaven day 1 and day 8 every 3 weeks started 08/09/2014, change to day 1 day 15 every 4 weeks from 01/24/2015 with cycle 9, completed 10 cycles  PET CT scan 04/03/2015: Moderate progression of hypermetabolic metastatic disease with heavy geographic metastatic disease throughout the liver, scattered subcutaneous and intramuscular hypermetabolic nodules in the neck and chest, multifocal diffuse hypermetabolic metastatic disease to the axial and appendicular skeleton increased from before.  CT brain: [place] 26 2016:2 hyperdense foci one within the deep right temporal lobe and second within the brainstem concerning for intracranial metastasis.  Recommendation: 1. Continue with oral dexamethasone 2. Radiation oncology referral for palliative radiation to brain and bones 3. I also discussed different options including palliative chemotherapy with either gemcitabine or Abraxane or Xeloda 4. The other option would be to consider hospice care because I believe her disease is rapidly progressing with multiple cutaneous metastases and newly found brain metastases. Her prognosis is very poor. 5. CODE STATUS: DO NOT RESUSCITATE CC  Plan: 1. Patient agreed to start gemcitabine  chemotherapy next week. I discussed the risks and benefits of chemotherapy including the risk of thrombocytopenia, neutropenia, nausea, fatigue. 2. I discussed the case with Dr. Lisbeth Renshaw who will evaluate her CT scan of the brain decide if she would be a candidate for stereotactic radiosurgery. Patient feels that she does not want to undergo radiation but I encouraged her to at least week with radiation oncology because chemotherapy does not fully penetrate the center of our system and untreated brain metastases can continue to make her symptoms worse especially with her dizziness  and gait imbalance and even potentially lead to seizures.  No orders of the defined types were placed in this encounter.   The patient has a good understanding of the overall plan. she agrees with it. she will call with any problems that may develop before the next visit here.   Rulon Eisenmenger, MD 04/14/2015

## 2015-04-15 ENCOUNTER — Emergency Department (HOSPITAL_COMMUNITY): Payer: 59

## 2015-04-15 ENCOUNTER — Telehealth: Payer: Self-pay

## 2015-04-15 ENCOUNTER — Other Ambulatory Visit: Payer: Self-pay

## 2015-04-15 ENCOUNTER — Emergency Department (HOSPITAL_COMMUNITY)
Admission: EM | Admit: 2015-04-15 | Discharge: 2015-04-15 | Disposition: A | Discharge status: Home / Self Care | Payer: 59 | Attending: Emergency Medicine | Admitting: Emergency Medicine

## 2015-04-15 ENCOUNTER — Encounter (HOSPITAL_COMMUNITY): Payer: Self-pay | Admitting: Emergency Medicine

## 2015-04-15 ENCOUNTER — Encounter: Payer: Self-pay | Admitting: Radiation Therapy

## 2015-04-15 DIAGNOSIS — C7931 Secondary malignant neoplasm of brain: Secondary | ICD-10-CM | POA: Diagnosis not present

## 2015-04-15 DIAGNOSIS — C7951 Secondary malignant neoplasm of bone: Secondary | ICD-10-CM | POA: Diagnosis not present

## 2015-04-15 DIAGNOSIS — R531 Weakness: Secondary | ICD-10-CM | POA: Diagnosis not present

## 2015-04-15 DIAGNOSIS — Z853 Personal history of malignant neoplasm of breast: Secondary | ICD-10-CM | POA: Insufficient documentation

## 2015-04-15 DIAGNOSIS — Z880 Allergy status to penicillin: Secondary | ICD-10-CM | POA: Diagnosis not present

## 2015-04-15 DIAGNOSIS — C799 Secondary malignant neoplasm of unspecified site: Secondary | ICD-10-CM

## 2015-04-15 DIAGNOSIS — Z8719 Personal history of other diseases of the digestive system: Secondary | ICD-10-CM | POA: Diagnosis not present

## 2015-04-15 DIAGNOSIS — Z79899 Other long term (current) drug therapy: Secondary | ICD-10-CM | POA: Diagnosis not present

## 2015-04-15 DIAGNOSIS — M25519 Pain in unspecified shoulder: Secondary | ICD-10-CM | POA: Diagnosis not present

## 2015-04-15 DIAGNOSIS — Z8742 Personal history of other diseases of the female genital tract: Secondary | ICD-10-CM | POA: Diagnosis not present

## 2015-04-15 DIAGNOSIS — R079 Chest pain, unspecified: Secondary | ICD-10-CM | POA: Diagnosis not present

## 2015-04-15 LAB — COMPREHENSIVE METABOLIC PANEL
ALBUMIN: 2.9 g/dL — AB (ref 3.5–5.0)
ALT: 81 U/L — AB (ref 14–54)
AST: 235 U/L — AB (ref 15–41)
Alkaline Phosphatase: 512 U/L — ABNORMAL HIGH (ref 38–126)
Anion gap: 9 (ref 5–15)
BILIRUBIN TOTAL: 2 mg/dL — AB (ref 0.3–1.2)
BUN: 19 mg/dL (ref 6–20)
CHLORIDE: 100 mmol/L — AB (ref 101–111)
CO2: 30 mmol/L (ref 22–32)
CREATININE: 0.71 mg/dL (ref 0.44–1.00)
Calcium: 9.1 mg/dL (ref 8.9–10.3)
GFR calc Af Amer: >60 mL/min (ref >60)
GLUCOSE: 121 mg/dL — AB (ref 65–99)
POTASSIUM: 4.5 mmol/L (ref 3.5–5.1)
Sodium: 139 mmol/L (ref 135–145)
Total Protein: 6.7 g/dL (ref 6.5–8.1)

## 2015-04-15 LAB — CBC WITH DIFFERENTIAL/PLATELET
Basophils Absolute: 0 10*3/uL (ref 0.0–0.1)
Basophils Relative: 0 %
EOS PCT: 0 %
Eosinophils Absolute: 0 10*3/uL (ref 0.0–0.7)
HEMATOCRIT: 35.3 % — AB (ref 36.0–46.0)
Hemoglobin: 10.8 g/dL — ABNORMAL LOW (ref 12.0–15.0)
LYMPHS PCT: 11 %
Lymphs Abs: 0.8 10*3/uL (ref 0.7–4.0)
MCH: 25.5 pg — AB (ref 26.0–34.0)
MCHC: 30.6 g/dL (ref 30.0–36.0)
MCV: 83.3 fL (ref 78.0–100.0)
MONO ABS: 0.9 10*3/uL (ref 0.1–1.0)
MONOS PCT: 13 %
NEUTROS ABS: 5.5 10*3/uL (ref 1.7–7.7)
Neutrophils Relative %: 76 %
PLATELETS: 246 10*3/uL (ref 150–400)
RBC: 4.24 MIL/uL (ref 3.87–5.11)
RDW: 20.6 % — AB (ref 11.5–15.5)
WBC: 7.3 10*3/uL (ref 4.0–10.5)

## 2015-04-15 LAB — TROPONIN I: Troponin I: 0.06 ng/mL — ABNORMAL HIGH (ref <0.031)

## 2015-04-15 LAB — I-STAT CG4 LACTIC ACID, ED
Lactic Acid, Venous: 1.44 mmol/L (ref 0.5–2.0)
Lactic Acid, Venous: 1.27 mmol/L (ref 0.5–2.0)

## 2015-04-15 MED ORDER — MORPHINE SULFATE (PF) 2 MG/ML IV SOLN
2.0000 mg | Freq: Once | INTRAVENOUS | Status: AC
  Administered 2015-04-15: 2 mg via INTRAVENOUS
  Filled 2015-04-15: qty 1

## 2015-04-15 MED ORDER — SODIUM CHLORIDE 0.9 % IV BOLUS (SEPSIS)
1000.0000 mL | Freq: Once | INTRAVENOUS | Status: AC
  Administered 2015-04-15: 1000 mL via INTRAVENOUS

## 2015-04-15 MED ORDER — MORPHINE SULFATE 15 MG PO TABS: 15.0000 mg | ORAL_TABLET | Freq: Four times a day (QID) | ORAL | Status: DC | PRN

## 2015-04-15 MED ORDER — IOHEXOL 350 MG/ML SOLN
100.0000 mL | Freq: Once | INTRAVENOUS | Status: AC | PRN
  Administered 2015-04-15: 80 mL via INTRAVENOUS

## 2015-04-15 MED ORDER — MORPHINE SULFATE 15 MG PO TABS
15.0000 mg | ORAL_TABLET | ORAL | Status: DC | PRN
  Administered 2015-04-15: 15 mg via ORAL
  Filled 2015-04-15: qty 1

## 2015-04-15 MED ORDER — FENTANYL CITRATE (PF) 100 MCG/2ML IJ SOLN
75.0000 ug | Freq: Once | INTRAMUSCULAR | Status: AC
  Administered 2015-04-15: 75 ug via INTRAVENOUS
  Filled 2015-04-15: qty 2

## 2015-04-15 NOTE — ED Notes (Signed)
Off floor xray

## 2015-04-15 NOTE — Telephone Encounter (Signed)
Per report from radiation oncology, spoke with pt.  C/o shoulder pain with movement 11/10, sharp/stabbing - radiates to ribs.  Oxycontin not working.  Reports she cannot get up.  Reports she cannot walk - when asked she states "I don'tk now why, they just won't work".    Per Dr. Lindi Adie, pt needs to go to ED immediately.  This Probation officer called [facility] - ambulance sent to patients house.    Pt notified that ambulance ordered to take her to ED.  She is to request that she be brought to [facility] ED.  Pt voiced understanding.    ED Charge RN, [place] notified.

## 2015-04-15 NOTE — Discharge Instructions (Signed)
Bone Metastasis Bone metastasis is cancer that spreads to the bones from another part of the body. A person may have bone metastasis in one bone or in more than one bone. Cancer that spreads to the bones is different from cancer that starts in the bones (primary bone cancer). Bone metastasis is more common than primary bone cancer. The spine is the most common area for bone metastasis. Other common areas include:  Hip bone (pelvis).  Ribs.  Skull.  Long bones of the arm or leg. Bone metastasis is painful, and it damages the bones. Bone metastasis damages and weakens bones in two ways. A person may have bone destruction (osteolytic damage) or abnormal bone growth (osteoblastic destruction). Both of these conditions can make bones so weak that they break (pathologic fracture) even from a minor injury. CAUSES This condition is caused by cancer cells that spread to bone. These cells can get into your bloodstream and spread through your body. They can also get into the vessels that are part of your lymphatic system (lymph vessels) and spread that way. RISK FACTORS This condition is more likely to develop in people who have an advanced type of cancer that is known to spread to bone. Cancers that often spread to bone include:  Breast cancer.  Prostate cancer.  Lung cancer.  Thyroid cancer.  Kidney cancer. SYMPTOMS The most common symptom of this condition is bone pain, especially while you are resting. Other symptoms include:  A broken bone (fracture) that happens with little or no trauma.  Low number of red blood cells (anemia). Bone destruction may damage the spongy tissue (bone marrow) in the center of some bones where red blood cells are produced. Anemia can cause:  Weakness.  Shortness of breath.  Headache.  Dizziness.  Back or neck pain with numbness or weakness, especially if you have bone metastasis in your spine.  High levels of calcium in your blood (hypercalcemia). When  bone is destroyed, calcium is released into your blood. Symptoms of hypercalcemia include:  Constipation.  Thirst.  Nausea.  Sleepiness. DIAGNOSIS This condition may be diagnosed based on:  Your symptoms and medical history. Your health care provider may suspect this condition if you are being treated for cancer or have had cancer treatment in the past.  A physical exam.  Imaging studies, such as:  Bone X-rays, especially in the area where you have pain.  CT scan.  Bone scan.  MRI.  Blood tests to check for anemia or hypercalcemia.  A procedure to remove a piece of bone so it can be examined under a microscope (biopsy). TREATMENT Treatment for this condition depends on your overall health, the type of cancer you have, and how much the cancer has spread. You will work with a team of health care providers to determine which treatment is best for you. Treatment will focus on managing pain, preventing bone weakness, and slowing the spread of the cancer. Treatment may include:  Radiation therapy. This treatment uses X-rays to kill cancer cells. It is most effective for reducing pain, stopping tumor growth, and lowering the risk of fractures.  Radioisotope therapy. This treatment uses a radioactive medicine that is injected into your blood. The medicine travels to areas where cancer cells are active and kills them.  Chemotherapy. For this treatment, you are given cancer-killing drugs. You may have chemotherapy in cycles, with rest periods in between.  Medicines to block cells that destroy bone (bisphosphonates and denosumab). These medicines are used to control bone  is injected into your blood. The medicine travels to areas where cancer cells are active and kills them.  · Chemotherapy. For this treatment, you are given cancer-killing drugs. You may have chemotherapy in cycles, with rest periods in between.  · Medicines to block cells that destroy bone (bisphosphonates and denosumab). These medicines are used to control bone pain. They may help to reduce hypercalcemia.  · Medicines to reduce pain (opiates).  · Endocrine therapies. These therapies slow cancer growth by blocking specific chemical messengers (hormones). Some types of cancer, including breast and prostate cancers, depend on hormones.  · Targeted therapies. These therapies involve  the use of drugs that block the growth and spread of cancer. This treatment is different from standard chemotherapy.  · Immunotherapies. These therapies use the body's defense system (immune system) to fight cancer cells.  · Surgery. You may have surgery to remove bone cancer or to prevent or repair a fracture.  HOME CARE INSTRUCTIONS  · Take medicines only as directed by your health care provider.  · Do not drive or operate heavy machinery while taking pain medicine.  · Take the following steps to help prevent constipation, which can result from some pain medicines.    Include plenty of fruits and whole grains in your diet.    Drink enough fluid to keep your urine clear or pale yellow.    Ask your health care provider about taking a laxative if needed.  · Keep all follow-up visits as directed by your health care provider. This is important.  SEEK MEDICAL CARE IF:  · Your pain medicine is not helping.  · You are too weak to care for yourself at home.  SEEK IMMEDIATE MEDICAL CARE IF:  · You fall and have pain or an injury.  · You have trouble walking.  · You have numbness or tingling in your legs.  · Your pain suddenly gets worse.  · You lose control of your bowels or your bladder.  · You are very sleepy or confused.     This information is not intended to replace advice given to you by your health care provider. Make sure you discuss any questions you have with your health care provider.     Document Released: 04/23/2002 Document Revised: 09/17/2014 Document Reviewed: 03/06/2014  Elsevier Interactive Patient Education ©2016 [employer]

## 2015-04-15 NOTE — ED Notes (Signed)
Pt given d/c

## 2015-04-15 NOTE — Progress Notes (Signed)
1.  Do you need a wheel chair?    y  2. On oxygen?  n  3. Have you ever had any surgery in the body part being scanned?  no  4. Have you ever had any surgery on your brain or heart?                         no  5. Have you ever had surgery on your eyes or ears?  no                                                        6. Do you have a pacemaker or defibrillator?   no  7. Do you have a Neurostimulator?   no  8. Claustrophobic?  yes 9. Any risk for metal in eyes?  no  10. Injury by bullet, buckshot, or shrapnel?no  11. Stent?      no                                                                                                           12. Hx of Cancer?       Stage IV Breast cancer with mets to the brain and liver                                                                                                     13. Kidney or Liver disease?  Yes mets in the liver   14. Hx of Lupus, Rheumatoid Arthritis or Scleroderma? no  15. IV Antibiotics or long term use of NSAIDS?  no  16. HX of Hypertension?  no  17. Diabetes?  no  18. Allergy to contrast?  no  19. Recent labs. Labs drawn 11/28. Results are in EPIC   Questions answered by the patient  over the phone on 11/29.  [name]

## 2015-04-15 NOTE — ED Notes (Signed)
Bed: [id number] Expected date:  Expected time:  Means of arrival:  Comments: EMS- [place] Pt, bilateral shoulder and rib cage pain/fever 100.8

## 2015-04-15 NOTE — ED Provider Notes (Signed)
CSN: [id number]     Arrival date & time 04/15/15  1127 History   First MD Initiated Contact with Patient 04/15/15 1137     Chief Complaint  Patient presents with  . Shoulder Pain  . Chest Pain     Patient is a 59 y.o. female presenting with shoulder pain and chest pain. The history is provided by the patient. No language interpreter was used.  Shoulder Pain Chest Pain  IZNA KNEER is a 59 y.o. female with history of metastatic breast cancer who presents to the Emergency Department complaining of shoulder and chest pain. Symptoms started suddenly this morning. She reports intense shoulder and chest pain. She has too much pain to get up and move. She denies any shortness of breath, fevers, cough. She states she's had vomiting lately. She reports generalized weakness with increased difficulty walking for the last week. She states her pain medications are not working for her metastatic breast cancer. Denies any lower extremity edema. Symptoms are severe, constant, worsening.  Past Medical History  Diagnosis Date  . Ovarian cyst   . Breast calcification, left   . Diverticulitis     when it flares pt. uses Probiotic, also use baking soda & honey everyday   . S/P radiation therapy 09/26/13-11/08/13    right breast  . Breast cancer ([place])     "right" (11/07/2012)   Past Surgical History  Procedure Laterality Date  . Mastectomy modified radical Right 11/07/2012  . Vaginal hysterectomy  2012  . Vaginal delivery      x2  . 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 Breast biopsy Right 10/2012  . Mastectomy modified radical Right 11/07/2012    Procedure: MASTECTOMY MODIFIED RADICAL;  Surgeon: Adin Hector, MD;  Location: [place];  Service: General;  Laterality: Right;  . Simple excision Right 09/03/2013    simple in office excision of mass right axilla   Family History  Problem Relation Age of Onset  . Cancer Mother     ovarian  .  Cancer Brother     bone  . Colon cancer Neg Hx    Social History  Substance Use Topics  . Smoking status: Never Smoker   . Smokeless tobacco: Never Used  . Alcohol Use: No   OB History    Gravida Para Term Preterm AB TAB SAB Ectopic Multiple Living   3 2 2  1  1   2      Review of Systems  Cardiovascular: Positive for chest pain.  All other systems reviewed and are negative.     Allergies  Penicillins  Home Medications   Prior to Admission medications   Medication Sig Start Date End Date Taking? Authorizing Provider  Calcium Carbonate-Vit D-Min (CALCIUM 1200 PO) Take 1 tablet by mouth daily at 12 noon.    Yes Historical Provider, MD  ibuprofen (ADVIL,MOTRIN) 200 MG tablet Take 400 mg by mouth every 4 (four) hours as needed for moderate pain.    Yes Historical Provider, MD  lidocaine-prilocaine (EMLA) cream Apply to affected area once 07/26/14  Yes Nicholas Lose, MD  LORazepam (ATIVAN) 0.5 MG tablet TAKE 1 TABLET EVERY 6 HOURS AS NEEDED Patient taking differently: TAKE 1 TABLET EVERY 6 HOURS AS NEEDED ANXEITY 08/09/14  Yes Nicholas Lose, MD  Multiple Vitamins-Minerals (MULTIVITAMIN PO) Take 1 tablet by mouth daily at 12 noon.    Yes Historical Provider, MD  naproxen sodium (ANAPROX) 220 MG tablet  Take 220 mg by mouth 2 (two) times daily as needed (pain).   Yes Historical Provider, MD  ondansetron (ZOFRAN) 8 MG tablet Take 1 tablet (8 mg total) by mouth 2 (two) times daily. The day after chemo for 2 days, then every 8 hours PRN for N/V 07/26/14  Yes Nicholas Lose, MD  OVER THE COUNTER MEDICATION Take 4 tablets by mouth 2 (two) times daily. ([place])   Yes Historical Provider, MD  oxyCODONE-acetaminophen (PERCOCET/ROXICET) 5-325 MG tablet Take 1-2 tablets by mouth every 6 (six) hours as needed. Patient taking differently: Take 1-2 tablets by mouth every 6 (six) hours as needed for moderate pain.  04/07/15  Yes Nicholas Lose, MD  prochlorperazine (COMPAZINE) 10 MG tablet Take 1 tablet  (10 mg total) by mouth every 6 (six) hours as needed (Nausea or vomiting). 07/26/14  Yes Nicholas Lose, MD  dexamethasone (DECADRON) 4 MG tablet Take 1 tablet (4 mg total) by mouth 2 (two) times daily. Patient not taking: Reported on 04/12/2015 03/28/15   Nicholas Lose, MD  UNABLE TO FIND Q000111Q Silicone Breast Prosthesis, Rt quantity 1 L8020 Mastectomy form, Rt, quantity 1 L8000 Mastectomy bra, quantity 6    Historical Provider, MD  UNABLE TO FIND 174.9 Malignant Neoplasm   Mastectomy Right  L8030-Silicone Breast Prosthesis-1  L8000- Mastectomy Bra-2 11/01/13   Fanny Skates, MD   BP 124/74 mmHg  Pulse 90  Temp(Src) 98.4 F (36.9 C) (Oral)  Resp 20  SpO2 97% Physical Exam  Constitutional: She is oriented to person, place, and time. She appears well-developed. She appears distressed.  Chronically ill-appearing  HENT:  Head: Normocephalic and atraumatic.  Cardiovascular: Normal rate and regular rhythm.   No murmur heard. Pulmonary/Chest: Effort normal and breath sounds normal. No respiratory distress.  Abdominal: Soft. There is no tenderness. There is no rebound and no guarding.  Musculoskeletal: She exhibits no edema or tenderness.  Neurological: She is alert and oriented to person, place, and time.  5 out of 5 strength in all 4 extremities  Skin: Skin is warm and dry.  Psychiatric: She has a normal mood and affect. Her behavior is normal.  Nursing note and vitals reviewed.   ED Course  Procedures (including critical care time) Labs Review Labs Reviewed  COMPREHENSIVE METABOLIC PANEL - Abnormal; Notable for the following:    Chloride 100 (*)    Glucose, Bld 121 (*)    Albumin 2.9 (*)    AST 235 (*)    ALT 81 (*)    Alkaline Phosphatase 512 (*)    Total Bilirubin 2.0 (*)    All other components within normal limits  CBC WITH DIFFERENTIAL/PLATELET - Abnormal; Notable for the following:    Hemoglobin 10.8 (*)    HCT 35.3 (*)    MCH 25.5 (*)    RDW 20.6 (*)    All other  components within normal limits  TROPONIN I - Abnormal; Notable for the following:    Troponin I 0.06 (*)    All other components within normal limits  I-STAT CG4 LACTIC ACID, ED    Imaging Review Dg Chest 2 View  04/15/2015  CLINICAL DATA:  59 year old with widespread metastatic breast cancer, presenting with fever, acute chest pain and bilateral shoulder pain. EXAM: CHEST  2 VIEW COMPARISON:  PET-CT 04/03/2015 and earlier. CT chest 02/13/2015 and earlier. Most recent prior chest x-ray 11/06/2012. FINDINGS: AP semi-erect and lateral images were obtained. Posterior costophrenic angles obscured on the lateral view due to the metal in  the stretcher. Suboptimal inspiration. Linear scar and atelectasis in the lower lobes and right middle lobe, unchanged from the PET-CT 12 days ago. No new pulmonary parenchymal abnormalities. Cardiac silhouette mildly enlarged, unchanged. Hilar and mediastinal contours unremarkable. Left jugular Port-A-Cath tip at or near the cavoatrial junction. Sclerotic osseous metastatic disease involving the thoracic and upper lumbar spine, sternum, and multiple ribs as noted on the PET-CT. Prior right mastectomy and axillary node dissection. IMPRESSION: 1. Suboptimal inspiration. Linear scar and atelectasis in the lower lobes and right middle lobe, unchanged since the PET-CT 12 days ago. 2.  No acute cardiopulmonary disease. 3. Widespread osseous metastatic disease as noted on PET-CT. Electronically Signed   By: Evangeline Dakin M.D.   On: 04/15/2015 12:58   I have personally reviewed and evaluated these images and lab results as part of my medical decision-making.   EKG Interpretation None      MDM   Final diagnoses:  Chest pain, unspecified chest pain type  Metastatic cancer [facility])    Patient here for evaluation of diffuse shoulder and chest pain that started today. She tried her home Percocet without any relief in her symptoms. She also endorses generalized weakness. She  has no reports of fever, cough, shortness of breath at home. She has known metastatic breast cancer with multiple bony metastases as well as brain metastases recently diagnosed. She is not currently undergoing treatments but is being considered for additional palliative chemotherapy next week. She reports she has no pain at rest but pain with motion. She initially stated she had improvement in her pain with fentanyl and then she stated that she has persistent pain. She has been requiring assistance with ambulation for the last week. She lives with family. Discussed with patient pain management options given her advanced cancer and discuss fentanyl patches and oral Dilaudid. Patient declined both of these options and wants to try oral morphine. She also declines admission for pain control.  Providing a dose in the emergency department with plan to DC home on oral treatment with close oncology follow-up.    Quintella Reichert, MD 04/15/15 [phone]

## 2015-04-15 NOTE — ED Notes (Signed)
Finished ED note triage after reassessing another pt.

## 2015-04-15 NOTE — ED Notes (Signed)
Pt arrives via [employer], c/o generalized weakness and pain bilaterally in shoulders and ribs. Described as Crushing pain verbalized by pt on assessment. Pt has hx of breast cancer and is referred to dr. Franklyn Lor, pt has a 20 in lac, 12 lead unremarkable, verbalized pain 10 out of 10. Ems presenting v/s 100.8 temp, 111/68bp, 97hr, rr18, 96 on 2l O2.

## 2015-04-16 ENCOUNTER — Other Ambulatory Visit: Payer: Self-pay

## 2015-04-16 NOTE — Progress Notes (Signed)
Referral called in to [facility] for Palliative Care for the patient.  LM with [name]

## 2015-04-17 ENCOUNTER — Other Ambulatory Visit: Payer: Self-pay

## 2015-04-17 DIAGNOSIS — C50111 Malignant neoplasm of central portion of right female breast: Secondary | ICD-10-CM

## 2015-04-19 ENCOUNTER — Inpatient Hospital Stay (HOSPITAL_COMMUNITY)
Admission: EM | Admit: 2015-04-19 | Discharge: 2015-04-22 | DRG: 871 | Disposition: A | Discharge status: Hospice | Payer: 59 | Attending: Internal Medicine | Admitting: Internal Medicine

## 2015-04-19 ENCOUNTER — Encounter (HOSPITAL_COMMUNITY): Payer: Self-pay | Admitting: Emergency Medicine

## 2015-04-19 DIAGNOSIS — J9811 Atelectasis: Secondary | ICD-10-CM | POA: Diagnosis present

## 2015-04-19 DIAGNOSIS — C792 Secondary malignant neoplasm of skin: Secondary | ICD-10-CM | POA: Diagnosis present

## 2015-04-19 DIAGNOSIS — R079 Chest pain, unspecified: Secondary | ICD-10-CM | POA: Diagnosis present

## 2015-04-19 DIAGNOSIS — E43 Unspecified severe protein-calorie malnutrition: Secondary | ICD-10-CM | POA: Diagnosis present

## 2015-04-19 DIAGNOSIS — Z515 Encounter for palliative care: Secondary | ICD-10-CM | POA: Diagnosis present

## 2015-04-19 DIAGNOSIS — R0789 Other chest pain: Secondary | ICD-10-CM | POA: Diagnosis present

## 2015-04-19 DIAGNOSIS — R0602 Shortness of breath: Secondary | ICD-10-CM | POA: Diagnosis not present

## 2015-04-19 DIAGNOSIS — Z8041 Family history of malignant neoplasm of ovary: Secondary | ICD-10-CM

## 2015-04-19 DIAGNOSIS — Z66 Do not resuscitate: Secondary | ICD-10-CM | POA: Diagnosis present

## 2015-04-19 DIAGNOSIS — R262 Difficulty in walking, not elsewhere classified: Secondary | ICD-10-CM | POA: Diagnosis present

## 2015-04-19 DIAGNOSIS — G893 Neoplasm related pain (acute) (chronic): Secondary | ICD-10-CM | POA: Diagnosis present

## 2015-04-19 DIAGNOSIS — C7800 Secondary malignant neoplasm of unspecified lung: Secondary | ICD-10-CM | POA: Diagnosis present

## 2015-04-19 DIAGNOSIS — Z79891 Long term (current) use of opiate analgesic: Secondary | ICD-10-CM

## 2015-04-19 DIAGNOSIS — C50111 Malignant neoplasm of central portion of right female breast: Secondary | ICD-10-CM | POA: Diagnosis present

## 2015-04-19 DIAGNOSIS — C787 Secondary malignant neoplasm of liver and intrahepatic bile duct: Secondary | ICD-10-CM | POA: Diagnosis present

## 2015-04-19 DIAGNOSIS — A419 Sepsis, unspecified organism: Secondary | ICD-10-CM | POA: Diagnosis not present

## 2015-04-19 DIAGNOSIS — R748 Abnormal levels of other serum enzymes: Secondary | ICD-10-CM | POA: Diagnosis present

## 2015-04-19 DIAGNOSIS — Z90710 Acquired absence of both cervix and uterus: Secondary | ICD-10-CM

## 2015-04-19 DIAGNOSIS — Z9011 Acquired absence of right breast and nipple: Secondary | ICD-10-CM

## 2015-04-19 DIAGNOSIS — C7951 Secondary malignant neoplasm of bone: Secondary | ICD-10-CM | POA: Diagnosis present

## 2015-04-19 DIAGNOSIS — Z79899 Other long term (current) drug therapy: Secondary | ICD-10-CM

## 2015-04-19 DIAGNOSIS — Z923 Personal history of irradiation: Secondary | ICD-10-CM

## 2015-04-19 DIAGNOSIS — C50911 Malignant neoplasm of unspecified site of right female breast: Secondary | ICD-10-CM | POA: Diagnosis present

## 2015-04-19 DIAGNOSIS — R7989 Other specified abnormal findings of blood chemistry: Secondary | ICD-10-CM | POA: Diagnosis present

## 2015-04-19 DIAGNOSIS — K5900 Constipation, unspecified: Secondary | ICD-10-CM | POA: Diagnosis present

## 2015-04-19 DIAGNOSIS — C7931 Secondary malignant neoplasm of brain: Secondary | ICD-10-CM | POA: Diagnosis present

## 2015-04-19 DIAGNOSIS — N390 Urinary tract infection, site not specified: Secondary | ICD-10-CM | POA: Diagnosis present

## 2015-04-19 DIAGNOSIS — Z808 Family history of malignant neoplasm of other organs or systems: Secondary | ICD-10-CM

## 2015-04-19 DIAGNOSIS — H538 Other visual disturbances: Secondary | ICD-10-CM | POA: Diagnosis present

## 2015-04-19 DIAGNOSIS — Z880 Allergy status to penicillin: Secondary | ICD-10-CM

## 2015-04-19 DIAGNOSIS — R197 Diarrhea, unspecified: Secondary | ICD-10-CM | POA: Diagnosis present

## 2015-04-19 DIAGNOSIS — F419 Anxiety disorder, unspecified: Secondary | ICD-10-CM | POA: Diagnosis present

## 2015-04-19 LAB — CBC
HCT: 35.1 % — ABNORMAL LOW (ref 36.0–46.0)
HEMOGLOBIN: 11 g/dL — AB (ref 12.0–15.0)
MCH: 26.1 pg (ref 26.0–34.0)
MCHC: 31.3 g/dL (ref 30.0–36.0)
MCV: 83.4 fL (ref 78.0–100.0)
Platelets: 206 10*3/uL (ref 150–400)
RBC: 4.21 MIL/uL (ref 3.87–5.11)
RDW: 20.8 % — AB (ref 11.5–15.5)
WBC: 4.9 10*3/uL (ref 4.0–10.5)

## 2015-04-19 LAB — BASIC METABOLIC PANEL
ANION GAP: 11 (ref 5–15)
BUN: 20 mg/dL (ref 6–20)
CALCIUM: 8.7 mg/dL — AB (ref 8.9–10.3)
CO2: 28 mmol/L (ref 22–32)
Chloride: 100 mmol/L — ABNORMAL LOW (ref 101–111)
Creatinine, Ser: 0.71 mg/dL (ref 0.44–1.00)
GFR calc Af Amer: >60 mL/min (ref >60)
GLUCOSE: 168 mg/dL — AB (ref 65–99)
POTASSIUM: 4.4 mmol/L (ref 3.5–5.1)
SODIUM: 139 mmol/L (ref 135–145)

## 2015-04-19 LAB — I-STAT TROPONIN, ED: TROPONIN I, POC: 0 ng/mL (ref 0.00–0.08)

## 2015-04-19 LAB — CBG MONITORING, ED: GLUCOSE-CAPILLARY: 140 mg/dL — AB (ref 65–99)

## 2015-04-19 NOTE — ED Provider Notes (Signed)
CSN: [id number]     Arrival date & time 04/19/15  2111 History   First MD Initiated Contact with Patient 04/19/15 2203     Chief Complaint  Patient presents with  . Weakness     (Consider location/radiation/quality/duration/timing/severity/associated sxs/prior Treatment) HPI   [name] is a 59 y.o. female with PMH significant for metastatic breast cancer who presents to the ED complaining of severe, constant, worsening generalized weakness that began Tuesday.  She also reports decreased appetite, constipation, bilateral lower extremity swelling, occasional CP, SOB, N/V, abdominal pain, dysuria, and increased frequency.  She also states she is unable to walk now due to feeling so weak.  She also states she is not sleeping well.  Nothing makes it better or worse.   Past Medical History  Diagnosis Date  . Ovarian cyst   . Breast calcification, left   . Diverticulitis     when it flares pt. uses Probiotic, also use baking soda & honey everyday   . S/P radiation therapy 09/26/13-11/08/13    right breast  . Breast cancer ([place])     "right" (11/07/2012)   Past Surgical History  Procedure Laterality Date  . Mastectomy modified radical Right 11/07/2012  . Vaginal hysterectomy  2012  . Vaginal delivery      x2  . 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 Breast biopsy Right 10/2012  . Mastectomy modified radical Right 11/07/2012    Procedure: MASTECTOMY MODIFIED RADICAL;  Surgeon: Adin Hector, MD;  Location: [place];  Service: General;  Laterality: Right;  . Simple excision Right 09/03/2013    simple in office excision of mass right axilla   Family History  Problem Relation Age of Onset  . Cancer Mother     ovarian  . Cancer Brother     bone  . Colon cancer Neg Hx    Social History  Substance Use Topics  . Smoking status: Never Smoker   . Smokeless tobacco: Never Used  . Alcohol Use: No   OB History    Gravida Para Term  Preterm AB TAB SAB Ectopic Multiple Living   3 2 2  1  1   2      Review of Systems All other systems negative unless otherwise stated in HPI    Allergies  Penicillins  Home Medications   Prior to Admission medications   Medication Sig Start Date End Date Taking? Authorizing Provider  Calcium Carbonate-Vit D-Min (CALCIUM 1200 PO) Take 1 tablet by mouth daily at 12 noon.    Yes Historical Provider, MD  ibuprofen (ADVIL,MOTRIN) 200 MG tablet Take 400 mg by mouth every 4 (four) hours as needed for moderate pain.    Yes Historical Provider, MD  lidocaine-prilocaine (EMLA) cream Apply to affected area once 07/26/14  Yes Nicholas Lose, MD  LORazepam (ATIVAN) 0.5 MG tablet TAKE 1 TABLET EVERY 6 HOURS AS NEEDED Patient taking differently: TAKE 1 TABLET EVERY 6 HOURS AS NEEDED ANXEITY 08/09/14  Yes Nicholas Lose, MD  morphine (MSIR) 15 MG tablet Take 1 tablet (15 mg total) by mouth every 6 (six) hours as needed for severe pain. 04/15/15  Yes Quintella Reichert, MD  Multiple Vitamins-Minerals (MULTIVITAMIN PO) Take 1 tablet by mouth daily at 12 noon.    Yes Historical Provider, MD  naproxen sodium (ANAPROX) 220 MG tablet Take 220 mg by mouth 2 (two) times daily as needed (pain).   Yes Historical Provider, MD  ondansetron (  ZOFRAN) 8 MG tablet Take 1 tablet (8 mg total) by mouth 2 (two) times daily. The day after chemo for 2 days, then every 8 hours PRN for N/V 07/26/14  Yes Nicholas Lose, MD  OVER THE COUNTER MEDICATION Take 4 tablets by mouth 2 (two) times daily. ([place])   Yes Historical Provider, MD  oxyCODONE-acetaminophen (PERCOCET/ROXICET) 5-325 MG tablet Take 1-2 tablets by mouth every 6 (six) hours as needed. Patient taking differently: Take 1-2 tablets by mouth every 6 (six) hours as needed for moderate pain.  04/07/15  Yes Nicholas Lose, MD  prochlorperazine (COMPAZINE) 10 MG tablet Take 1 tablet (10 mg total) by mouth every 6 (six) hours as needed (Nausea or vomiting). 07/26/14  Yes Nicholas Lose,  MD  UNABLE TO FIND Q000111Q Silicone Breast Prosthesis, Rt quantity 1 L8020 Mastectomy form, Rt, quantity 1 L8000 Mastectomy bra, quantity 6   Yes Historical Provider, MD  UNABLE TO FIND 174.9 Malignant Neoplasm   Mastectomy Right  L8030-Silicone Breast Prosthesis-1  L8000- Mastectomy Bra-2 11/01/13  Yes Fanny Skates, MD  dexamethasone (DECADRON) 4 MG tablet Take 1 tablet (4 mg total) by mouth 2 (two) times daily. Patient not taking: Reported on 04/19/2015 03/28/15   Nicholas Lose, MD   BP 127/69 mmHg  Pulse 101  Temp(Src) 99.8 F (37.7 C) (Oral)  Resp 25  SpO2 91% Physical Exam  Constitutional: She is oriented to person, place, and time. She appears well-developed and well-nourished.  Frail appearing female.  HENT:  Head: Normocephalic and atraumatic.  Mouth/Throat: Oropharynx is clear and moist.  Eyes: Conjunctivae are normal. Pupils are equal, round, and reactive to light.  Neck: Normal range of motion. Neck supple.  Cardiovascular: Normal rate, regular rhythm and normal heart sounds.   No murmur heard. Bilateral lower extremity edema to the calves.  Pulmonary/Chest: Effort normal and breath sounds normal. No accessory muscle usage or stridor. No respiratory distress. She has no wheezes. She has no rhonchi. She has no rales.  Abdominal: Soft. Bowel sounds are normal. She exhibits no distension. There is tenderness (mild) in the epigastric area and left upper quadrant. There is no rebound and no guarding.  Musculoskeletal: Normal range of motion.  Lymphadenopathy:    She has no cervical adenopathy.  Neurological: She is alert and oriented to person, place, and time.  Speech clear without dysarthria.  Strength and sensation intact bilaterally throughout upper and lower extremities.   Skin: Skin is warm and dry.  Psychiatric: She has a normal mood and affect. Her behavior is normal.    ED Course  Procedures (including critical care time) Labs Review Labs Reviewed  BASIC  METABOLIC PANEL - Abnormal; Notable for the following:    Chloride 100 (*)    Glucose, Bld 168 (*)    Calcium 8.7 (*)    All other components within normal limits  CBC - Abnormal; Notable for the following:    Hemoglobin 11.0 (*)    HCT 35.1 (*)    RDW 20.8 (*)    All other components within normal limits  URINALYSIS, ROUTINE W REFLEX MICROSCOPIC (NOT AT [facility]) - Abnormal; Notable for the following:    Color, Urine AMBER (*)    Bilirubin Urine MODERATE (*)    Leukocytes, UA SMALL (*)    All other components within normal limits  HEPATIC FUNCTION PANEL - Abnormal; Notable for the following:    Total Protein 6.3 (*)    Albumin 2.3 (*)    AST 278 (*)  ALT 80 (*)    Alkaline Phosphatase 1732 (*)    Total Bilirubin 3.2 (*)    Bilirubin, Direct 2.1 (*)    Indirect Bilirubin 1.1 (*)    All other components within normal limits  URINE MICROSCOPIC-ADD ON - Abnormal; Notable for the following:    Bacteria, UA FEW (*)    All other components within normal limits  CBG MONITORING, ED - Abnormal; Notable for the following:    Glucose-Capillary 140 (*)    All other components within normal limits  URINE CULTURE  LIPASE, BLOOD  I-STAT TROPOININ, ED    Imaging Review Dg Chest 2 View  04/20/2015  CLINICAL DATA:  Dyspnea and weakness. EXAM: CHEST  2 VIEW COMPARISON:  04/15/2015 FINDINGS: There is a left-sided Port-A-Cath with tip in the low SVC. There is a shallow inspiration. There are mild basilar opacities which are accentuated due to the shallow inspiration but there probably is a degree of basilar atelectasis or streaky basilar infiltrate which has worsened. No confluent airspace consolidation. No large effusions. Hilar and mediastinal contours are unremarkable and unchanged. Diffuse sclerotic skeletal metastases are again evident. IMPRESSION: 1. Linear bibasilar atelectasis or infiltrate, worsened although somewhat accentuated by the shallow inspiration. 2. No confluent airspace  consolidation.  No effusion. 3. Diffuse sclerotic skeletal metastases. Electronically Signed   By: Andreas Newport M.D.   On: 04/20/2015 01:29   I have personally reviewed and evaluated these images and lab results as part of my medical decision-making.   EKG Interpretation   Date/Time:  Saturday April 19 2015 22:08:29 EST Ventricular Rate:  94 PR Interval:  145 QRS Duration: 87 QT Interval:  350 QTC Calculation: 438 R Axis:   43 Text Interpretation:  Sinus rhythm Nonspecific T abnormalities, anterior  leads No significant change since last tracing Confirmed by POLLINA  MD,  CHRISTOPHER [phone]) on 04/19/2015 11:39:02 PM      MDM   Final diagnoses:  UTI (lower urinary tract infection)  Atypical chest pain  Shortness of breath    Patient with metastatic breast cancer presents with generalized weakness x 6 days.  VSS, NAD.  On exam, heart RRR, lungs CTAB, abdomen soft with mild tenderness in epigastric/LUQ regions.  No  neurological deficits. Recent CT PE study 04/12/15 showed no evidence of PE.  EKG shows sinus rhythm, troponin 0.00. CXR shows linear bibasilar atelectasis or infiltrate.  No confluent consolidation or effusion.  Diffuse sclerotic skeletal metastases. CBC shows hgb 11.0, stable.  WBC 4.9 BMP shows glucose 168, lipase unremarkable. CMP appears baseline UA shows signs of infection with small leukocytes and few bacteria.  Will give IV levaquin and fluids.  Will admit to medicine.  Case has been discussed with and seen by Dr. Betsey Holiday who agrees with the above plan for admission.       Gloriann Loan, PA-C 04/20/15 0200  Orpah Greek, MD 04/20/15 [phone]

## 2015-04-19 NOTE — ED Notes (Addendum)
Patient aware that a urine sample is needed, but is unable to urinate at this moment.

## 2015-04-19 NOTE — ED Notes (Signed)
Pt from home c/o weakness, loss of appetite,and constipation x few days. Per family patient normally can walk on her own however, the past several days she has not been able to do so. She c/o pain all over

## 2015-04-19 NOTE — ED Notes (Signed)
Bed: WA07 Expected date:  Expected time:  Means of arrival:  Comments: T1

## 2015-04-20 ENCOUNTER — Emergency Department (HOSPITAL_COMMUNITY): Payer: 59

## 2015-04-20 DIAGNOSIS — J9811 Atelectasis: Secondary | ICD-10-CM | POA: Diagnosis present

## 2015-04-20 DIAGNOSIS — G893 Neoplasm related pain (acute) (chronic): Secondary | ICD-10-CM | POA: Diagnosis present

## 2015-04-20 DIAGNOSIS — Z66 Do not resuscitate: Secondary | ICD-10-CM | POA: Diagnosis not present

## 2015-04-20 DIAGNOSIS — C50911 Malignant neoplasm of unspecified site of right female breast: Secondary | ICD-10-CM | POA: Diagnosis present

## 2015-04-20 DIAGNOSIS — A419 Sepsis, unspecified organism: Secondary | ICD-10-CM | POA: Diagnosis present

## 2015-04-20 DIAGNOSIS — R197 Diarrhea, unspecified: Secondary | ICD-10-CM | POA: Diagnosis not present

## 2015-04-20 DIAGNOSIS — Z79891 Long term (current) use of opiate analgesic: Secondary | ICD-10-CM | POA: Diagnosis not present

## 2015-04-20 DIAGNOSIS — N390 Urinary tract infection, site not specified: Secondary | ICD-10-CM

## 2015-04-20 DIAGNOSIS — C7800 Secondary malignant neoplasm of unspecified lung: Secondary | ICD-10-CM | POA: Diagnosis present

## 2015-04-20 DIAGNOSIS — Z880 Allergy status to penicillin: Secondary | ICD-10-CM | POA: Diagnosis not present

## 2015-04-20 DIAGNOSIS — Z515 Encounter for palliative care: Secondary | ICD-10-CM | POA: Diagnosis not present

## 2015-04-20 DIAGNOSIS — C7951 Secondary malignant neoplasm of bone: Secondary | ICD-10-CM | POA: Diagnosis not present

## 2015-04-20 DIAGNOSIS — K5900 Constipation, unspecified: Secondary | ICD-10-CM

## 2015-04-20 DIAGNOSIS — E43 Unspecified severe protein-calorie malnutrition: Secondary | ICD-10-CM | POA: Diagnosis present

## 2015-04-20 DIAGNOSIS — Z8041 Family history of malignant neoplasm of ovary: Secondary | ICD-10-CM | POA: Diagnosis not present

## 2015-04-20 DIAGNOSIS — H538 Other visual disturbances: Secondary | ICD-10-CM | POA: Diagnosis present

## 2015-04-20 DIAGNOSIS — Z9011 Acquired absence of right breast and nipple: Secondary | ICD-10-CM | POA: Diagnosis not present

## 2015-04-20 DIAGNOSIS — F419 Anxiety disorder, unspecified: Secondary | ICD-10-CM | POA: Diagnosis present

## 2015-04-20 DIAGNOSIS — C792 Secondary malignant neoplasm of skin: Secondary | ICD-10-CM | POA: Diagnosis not present

## 2015-04-20 DIAGNOSIS — R0602 Shortness of breath: Secondary | ICD-10-CM | POA: Diagnosis present

## 2015-04-20 DIAGNOSIS — R079 Chest pain, unspecified: Secondary | ICD-10-CM | POA: Diagnosis present

## 2015-04-20 DIAGNOSIS — Z79899 Other long term (current) drug therapy: Secondary | ICD-10-CM | POA: Diagnosis not present

## 2015-04-20 DIAGNOSIS — Z90710 Acquired absence of both cervix and uterus: Secondary | ICD-10-CM | POA: Diagnosis not present

## 2015-04-20 DIAGNOSIS — R748 Abnormal levels of other serum enzymes: Secondary | ICD-10-CM | POA: Diagnosis not present

## 2015-04-20 DIAGNOSIS — C7931 Secondary malignant neoplasm of brain: Secondary | ICD-10-CM | POA: Diagnosis not present

## 2015-04-20 DIAGNOSIS — R0789 Other chest pain: Secondary | ICD-10-CM | POA: Diagnosis not present

## 2015-04-20 DIAGNOSIS — C50111 Malignant neoplasm of central portion of right female breast: Secondary | ICD-10-CM | POA: Diagnosis not present

## 2015-04-20 DIAGNOSIS — R7989 Other specified abnormal findings of blood chemistry: Secondary | ICD-10-CM | POA: Diagnosis present

## 2015-04-20 DIAGNOSIS — R262 Difficulty in walking, not elsewhere classified: Secondary | ICD-10-CM | POA: Diagnosis present

## 2015-04-20 DIAGNOSIS — Z923 Personal history of irradiation: Secondary | ICD-10-CM | POA: Diagnosis not present

## 2015-04-20 DIAGNOSIS — Z808 Family history of malignant neoplasm of other organs or systems: Secondary | ICD-10-CM | POA: Diagnosis not present

## 2015-04-20 DIAGNOSIS — C787 Secondary malignant neoplasm of liver and intrahepatic bile duct: Secondary | ICD-10-CM | POA: Diagnosis not present

## 2015-04-20 LAB — COMPREHENSIVE METABOLIC PANEL
ALBUMIN: 2.1 g/dL — AB (ref 3.5–5.0)
ALK PHOS: 1764 U/L — AB (ref 38–126)
ALT: 81 U/L — ABNORMAL HIGH (ref 14–54)
ANION GAP: 12 (ref 5–15)
AST: 271 U/L — ABNORMAL HIGH (ref 15–41)
BILIRUBIN TOTAL: 3.3 mg/dL — AB (ref 0.3–1.2)
BUN: 18 mg/dL (ref 6–20)
CALCIUM: 8.6 mg/dL — AB (ref 8.9–10.3)
CO2: 29 mmol/L (ref 22–32)
Chloride: 102 mmol/L (ref 101–111)
Creatinine, Ser: 0.62 mg/dL (ref 0.44–1.00)
Glucose, Bld: 106 mg/dL — ABNORMAL HIGH (ref 65–99)
POTASSIUM: 4.7 mmol/L (ref 3.5–5.1)
Sodium: 143 mmol/L (ref 135–145)
TOTAL PROTEIN: 6.1 g/dL — AB (ref 6.5–8.1)

## 2015-04-20 LAB — URINALYSIS, ROUTINE W REFLEX MICROSCOPIC
Glucose, UA: NEGATIVE mg/dL
Hgb urine dipstick: NEGATIVE
KETONES UR: NEGATIVE mg/dL
Nitrite: NEGATIVE
PROTEIN: NEGATIVE mg/dL
Specific Gravity, Urine: 1.016 (ref 1.005–1.030)
pH: 6 (ref 5.0–8.0)

## 2015-04-20 LAB — HEPATIC FUNCTION PANEL
ALT: 80 U/L — AB (ref 14–54)
AST: 278 U/L — AB (ref 15–41)
Albumin: 2.3 g/dL — ABNORMAL LOW (ref 3.5–5.0)
Alkaline Phosphatase: 1732 U/L — ABNORMAL HIGH (ref 38–126)
BILIRUBIN DIRECT: 2.1 mg/dL — AB (ref 0.1–0.5)
BILIRUBIN INDIRECT: 1.1 mg/dL — AB (ref 0.3–0.9)
TOTAL PROTEIN: 6.3 g/dL — AB (ref 6.5–8.1)
Total Bilirubin: 3.2 mg/dL — ABNORMAL HIGH (ref 0.3–1.2)

## 2015-04-20 LAB — CBC
HEMATOCRIT: 34.2 % — AB (ref 36.0–46.0)
HEMOGLOBIN: 10.6 g/dL — AB (ref 12.0–15.0)
MCH: 25.4 pg — ABNORMAL LOW (ref 26.0–34.0)
MCHC: 31 g/dL (ref 30.0–36.0)
MCV: 81.8 fL (ref 78.0–100.0)
Platelets: 205 10*3/uL (ref 150–400)
RBC: 4.18 MIL/uL (ref 3.87–5.11)
RDW: 20.4 % — ABNORMAL HIGH (ref 11.5–15.5)
WBC: 5.5 10*3/uL (ref 4.0–10.5)

## 2015-04-20 LAB — URINE MICROSCOPIC-ADD ON
RBC / HPF: NONE SEEN RBC/hpf (ref 0–5)
SQUAMOUS EPITHELIAL / LPF: NONE SEEN

## 2015-04-20 LAB — APTT: aPTT: 30 seconds (ref 24–37)

## 2015-04-20 LAB — PROTIME-INR
INR: 1.16 (ref 0.00–1.49)
PROTHROMBIN TIME: 14.9 s (ref 11.6–15.2)

## 2015-04-20 LAB — PROCALCITONIN: PROCALCITONIN: 3.66 ng/mL

## 2015-04-20 LAB — LIPASE, BLOOD: Lipase: 18 U/L (ref 11–51)

## 2015-04-20 LAB — LACTIC ACID, PLASMA: Lactic Acid, Venous: 2.2 mmol/L (ref 0.5–2.0)

## 2015-04-20 MED ORDER — LEVOFLOXACIN IN D5W 750 MG/150ML IV SOLN
750.0000 mg | INTRAVENOUS | Status: DC
  Administered 2015-04-20 – 2015-04-21 (×2): 750 mg via INTRAVENOUS
  Filled 2015-04-20 (×2): qty 150

## 2015-04-20 MED ORDER — POLYETHYLENE GLYCOL 3350 17 G PO PACK: 17.0000 g | PACK | Freq: Every day | ORAL | Status: DC | PRN

## 2015-04-20 MED ORDER — ONDANSETRON HCL 4 MG/2ML IJ SOLN
4.0000 mg | Freq: Three times a day (TID) | INTRAMUSCULAR | Status: DC | PRN
  Administered 2015-04-20 – 2015-04-21 (×4): 4 mg via INTRAVENOUS
  Filled 2015-04-20 (×4): qty 2

## 2015-04-20 MED ORDER — SODIUM CHLORIDE 0.9 % IJ SOLN
3.0000 mL | Freq: Two times a day (BID) | INTRAMUSCULAR | Status: DC
  Administered 2015-04-20 – 2015-04-21 (×2): 3 mL via INTRAVENOUS

## 2015-04-20 MED ORDER — MORPHINE SULFATE 15 MG PO TABS
15.0000 mg | ORAL_TABLET | Freq: Four times a day (QID) | ORAL | Status: DC | PRN
  Administered 2015-04-21 (×2): 15 mg via ORAL
  Filled 2015-04-20 (×2): qty 1

## 2015-04-20 MED ORDER — ENSURE ENLIVE PO LIQD
237.0000 mL | Freq: Two times a day (BID) | ORAL | Status: DC
  Administered 2015-04-20 – 2015-04-22 (×3): 237 mL via ORAL

## 2015-04-20 MED ORDER — OXYCODONE-ACETAMINOPHEN 5-325 MG PO TABS
1.0000 | ORAL_TABLET | Freq: Four times a day (QID) | ORAL | Status: DC | PRN
  Administered 2015-04-20 (×2): 1 via ORAL
  Filled 2015-04-20 (×2): qty 1

## 2015-04-20 MED ORDER — SODIUM CHLORIDE 0.9 % IV BOLUS (SEPSIS)
500.0000 mL | Freq: Once | INTRAVENOUS | Status: AC
  Administered 2015-04-20: 500 mL via INTRAVENOUS

## 2015-04-20 MED ORDER — DEXAMETHASONE 4 MG PO TABS
4.0000 mg | ORAL_TABLET | Freq: Two times a day (BID) | ORAL | Status: DC
  Administered 2015-04-20 – 2015-04-22 (×4): 4 mg via ORAL
  Filled 2015-04-20 (×5): qty 1

## 2015-04-20 MED ORDER — LORAZEPAM 0.5 MG PO TABS: 0.5000 mg | ORAL_TABLET | Freq: Four times a day (QID) | ORAL | Status: DC | PRN

## 2015-04-20 MED ORDER — IPRATROPIUM-ALBUTEROL 0.5-2.5 (3) MG/3ML IN SOLN: 3.0000 mL | RESPIRATORY_TRACT | Status: DC | PRN

## 2015-04-20 MED ORDER — DOCUSATE SODIUM 100 MG PO CAPS
100.0000 mg | ORAL_CAPSULE | Freq: Two times a day (BID) | ORAL | Status: DC
  Administered 2015-04-20 – 2015-04-21 (×2): 100 mg via ORAL
  Filled 2015-04-20 (×5): qty 1

## 2015-04-20 MED ORDER — ALUM & MAG HYDROXIDE-SIMETH 200-200-20 MG/5ML PO SUSP: 30.0000 mL | Freq: Four times a day (QID) | ORAL | Status: DC | PRN

## 2015-04-20 MED ORDER — SODIUM CHLORIDE 0.9 % IV BOLUS (SEPSIS)
2000.0000 mL | Freq: Once | INTRAVENOUS | Status: AC
  Administered 2015-04-20: 2000 mL via INTRAVENOUS

## 2015-04-20 MED ORDER — CALCIUM CARBONATE-VITAMIN D 500-200 MG-UNIT PO TABS
1.0000 | ORAL_TABLET | Freq: Every day | ORAL | Status: DC
  Administered 2015-04-20 – 2015-04-21 (×2): 1 via ORAL
  Filled 2015-04-20 (×2): qty 1

## 2015-04-20 MED ORDER — DM-GUAIFENESIN ER 30-600 MG PO TB12
1.0000 | ORAL_TABLET | Freq: Two times a day (BID) | ORAL | Status: DC
  Administered 2015-04-20 – 2015-04-22 (×6): 1 via ORAL
  Filled 2015-04-20 (×7): qty 1

## 2015-04-20 MED ORDER — IBUPROFEN 200 MG PO TABS: 400.0000 mg | ORAL_TABLET | ORAL | Status: DC | PRN

## 2015-04-20 MED ORDER — SODIUM CHLORIDE 0.9 % IV SOLN
INTRAVENOUS | Status: DC
  Administered 2015-04-20 – 2015-04-22 (×3): via INTRAVENOUS

## 2015-04-20 MED ORDER — ENOXAPARIN SODIUM 40 MG/0.4ML ~~LOC~~ SOLN
40.0000 mg | SUBCUTANEOUS | Status: DC
  Administered 2015-04-20 – 2015-04-22 (×3): 40 mg via SUBCUTANEOUS
  Filled 2015-04-20 (×3): qty 0.4

## 2015-04-20 MED ORDER — IOHEXOL 350 MG/ML SOLN: 100.0000 mL | Freq: Once | INTRAVENOUS | Status: DC | PRN

## 2015-04-20 NOTE — Progress Notes (Signed)
Utilization review completed.  

## 2015-04-20 NOTE — H&P (Signed)
[facility] History and Physical  [name] [id number] DOB: December 16, 1955 DOA: 04/19/2015  Referring physician: ED physician PCP: Rulon Eisenmenger, MD  Specialists:   Chief Complaint: Dysuria, increased urinary frequency, shortness of breath.  HPI: [name] is a 59 y.o. female with PMH of diffusely metastasized breast cancer, diverticulitis, who presents with dysuria, increased urinary frequency and shortness of breath.  Patient reports that she has been having dysuria, increased urinary frequency in the past several days. She does not have burning on urination. No fever or chills. She also reports having dry cough and shortness of breath. She has mild chest pain intermittently. No tenderness over calf areas. She also has generalized weakness, mild abdominal pain, constipation and mild bilateral ankle edema. No diarrhea or unilateral weakness.  In ED, patient was found to have lactate 2.2, positive urinalysis with small amount of leukocyte, negative troponin, WBC 4.9, temperature 99.8, tachycardia, tachypnea, electrolytes and renal function okay. Chest x-ray showed linear bibasilar atelectasis, no confluent infiltration. Abnormal liver function with ALP 1732, albumin 2.3, AST 278, ALT 80, total bilirubin 3.2 with indirect bilirubin 1.1 and direct bilirubin 2.1. Patient is admitted to inpatient for further eval and treatment.  Where does patient live?   At home    Can patient participate in ADLs? Barely   Review of Systems:   General: no fevers, chills, no changes in body weight, has poor appetite, has fatigue HEENT: no blurry vision, hearing changes or sore throat Pulm: has dyspnea, coughing, no wheezing CV: has chest pain, no palpitations Abd: no nausea, vomiting, has abdominal pain, no diarrhea, has constipation GU: has dysuria, increased urinary frequency, no hematuria  Ext: has ankle edema Neuro: no unilateral weakness, numbness, or tingling, no vision change or  hearing loss Skin: no rash MSK: No muscle spasm, no deformity, no limitation of range of movement in spin Heme: No easy bruising.  Travel history: No recent long distant travel.  Allergy:  Allergies  Allergen Reactions  . Penicillins Rash    Has patient had a PCN reaction causing immediate rash, facial/tongue/throat swelling, SOB or lightheadedness with hypotension: no Has patient had a PCN reaction causing severe rash involving mucus membranes or skin necrosis: no Has patient had a PCN reaction that required hospitalization no Has patient had a PCN reaction occurring within the last 10 years: yes If all of the above answers are "NO", then may proceed with Cephalosporin use.     Past Medical History  Diagnosis Date  . Ovarian cyst   . Breast calcification, left   . Diverticulitis     when it flares pt. uses Probiotic, also use baking soda & honey everyday   . S/P radiation therapy 09/26/13-11/08/13    right breast  . Breast cancer ([place])     "right" (11/07/2012)    Past Surgical History  Procedure Laterality Date  . Mastectomy modified radical Right 11/07/2012  . Vaginal hysterectomy  2012  . Vaginal delivery      x2  . 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 Breast biopsy Right 10/2012  . Mastectomy modified radical Right 11/07/2012    Procedure: MASTECTOMY MODIFIED RADICAL;  Surgeon: Adin Hector, MD;  Location: [facility];  Service: General;  Laterality: Right;  . Simple excision Right 09/03/2013    simple in office excision of mass right axilla    Social History:  reports that she has never smoked. She has never used smokeless  tobacco. She reports that she does not drink alcohol or use illicit drugs.  Family History:  Family History  Problem Relation Age of Onset  . Cancer Mother     ovarian  . Cancer Brother     bone  . Colon cancer Neg Hx      Prior to Admission medications   Medication Sig Start Date End  Date Taking? Authorizing Provider  Calcium Carbonate-Vit D-Min (CALCIUM 1200 PO) Take 1 tablet by mouth daily at 12 noon.    Yes Historical Provider, MD  ibuprofen (ADVIL,MOTRIN) 200 MG tablet Take 400 mg by mouth every 4 (four) hours as needed for moderate pain.    Yes Historical Provider, MD  lidocaine-prilocaine (EMLA) cream Apply to affected area once 07/26/14  Yes Nicholas Lose, MD  LORazepam (ATIVAN) 0.5 MG tablet TAKE 1 TABLET EVERY 6 HOURS AS NEEDED Patient taking differently: TAKE 1 TABLET EVERY 6 HOURS AS NEEDED ANXEITY 08/09/14  Yes Nicholas Lose, MD  morphine (MSIR) 15 MG tablet Take 1 tablet (15 mg total) by mouth every 6 (six) hours as needed for severe pain. 04/15/15  Yes Quintella Reichert, MD  Multiple Vitamins-Minerals (MULTIVITAMIN PO) Take 1 tablet by mouth daily at 12 noon.    Yes Historical Provider, MD  naproxen sodium (ANAPROX) 220 MG tablet Take 220 mg by mouth 2 (two) times daily as needed (pain).   Yes Historical Provider, MD  ondansetron (ZOFRAN) 8 MG tablet Take 1 tablet (8 mg total) by mouth 2 (two) times daily. The day after chemo for 2 days, then every 8 hours PRN for N/V 07/26/14  Yes Nicholas Lose, MD  OVER THE COUNTER MEDICATION Take 4 tablets by mouth 2 (two) times daily. ([place])   Yes Historical Provider, MD  oxyCODONE-acetaminophen (PERCOCET/ROXICET) 5-325 MG tablet Take 1-2 tablets by mouth every 6 (six) hours as needed. Patient taking differently: Take 1-2 tablets by mouth every 6 (six) hours as needed for moderate pain.  04/07/15  Yes Nicholas Lose, MD  prochlorperazine (COMPAZINE) 10 MG tablet Take 1 tablet (10 mg total) by mouth every 6 (six) hours as needed (Nausea or vomiting). 07/26/14  Yes Nicholas Lose, MD  UNABLE TO FIND Q000111Q Silicone Breast Prosthesis, Rt quantity 1 L8020 Mastectomy form, Rt, quantity 1 L8000 Mastectomy bra, quantity 6   Yes Historical Provider, MD  UNABLE TO FIND 174.9 Malignant Neoplasm   Mastectomy Right  L8030-Silicone Breast  Prosthesis-1  L8000- Mastectomy Bra-2 11/01/13  Yes Fanny Skates, MD  dexamethasone (DECADRON) 4 MG tablet Take 1 tablet (4 mg total) by mouth 2 (two) times daily. Patient not taking: Reported on 04/19/2015 03/28/15   Nicholas Lose, MD    Physical Exam: Filed Vitals:   04/19/15 2116 04/19/15 2138 04/20/15 0020  BP: 120/75 116/72 127/69  Pulse: 98 95 101  Temp: 98.4 F (36.9 C)  99.8 F (37.7 C)  TempSrc: Oral  Oral  Resp: 17 17 25   SpO2: 94% 93% 91%   General: Not in acute distress. Cachectic. HEENT:       Eyes: PERRL, EOMI, no scleral icterus.       ENT: No discharge from the ears and nose, no pharynx injection, no tonsillar enlargement.        Neck: No JVD, no bruit, no mass felt. Heme: No neck lymph node enlargement. Cardiac: S1/S2, RRR, No murmurs, No gallops or rubs. Pulm:  No rales, wheezing, rhonchi or rubs. Abd: Soft, nondistended, nontender, no rebound pain, no organomegaly, BS present. Ext: has ankle  edema bilaterally. 2+DP/PT pulse bilaterally. Musculoskeletal: No joint deformities, No joint redness or warmth, no limitation of ROM in spin. Skin: No rashes.  Neuro: Alert, oriented X3, cranial nerves II-XII grossly intact, muscle strength 4/5 in all extremities, sensation to light touch intact.  Psych: Patient is not psychotic, no suicidal or hemocidal ideation.  Labs on Admission:  Basic Metabolic Panel:  Recent Labs Lab 04/14/15 1241 04/15/15 1145 04/19/15 2159  NA 136 139 139  K 4.1 4.5 4.4  CL  --  100* 100*  CO2 26 30 28   GLUCOSE 127 121* 168*  BUN 14.7 19 20   CREATININE 0.9 0.71 0.71  CALCIUM 9.5 9.1 8.7*   Liver Function Tests:  Recent Labs Lab 04/14/15 1241 04/15/15 1145 04/19/15 2326  AST 227* 235* 278*  ALT 84* 81* 80*  ALKPHOS 2,058* 512* 1732*  BILITOT 2.05* 2.0* 3.2*  PROT 7.0 6.7 6.3*  ALBUMIN 2.6* 2.9* 2.3*    Recent Labs Lab 04/19/15 2326  LIPASE 18   No results for input(s): AMMONIA in the last 168 hours. CBC:  Recent  Labs Lab 04/14/15 1241 04/15/15 1145 04/19/15 2159  WBC 6.9 7.3 4.9  NEUTROABS 5.4 5.5  --   HGB 10.9* 10.8* 11.0*  HCT 35.2 35.3* 35.1*  MCV 83.2 83.3 83.4  PLT 234 246 206   Cardiac Enzymes:  Recent Labs Lab 04/15/15 1154  TROPONINI 0.06*    BNP (last 3 results) No results for input(s): BNP in the last 8760 hours.  ProBNP (last 3 results) No results for input(s): PROBNP in the last 8760 hours.  CBG:  Recent Labs Lab 04/19/15 2213  GLUCAP 140*    Radiological Exams on Admission: Dg Chest 2 View  04/20/2015  CLINICAL DATA:  Dyspnea and weakness. EXAM: CHEST  2 VIEW COMPARISON:  04/15/2015 FINDINGS: There is a left-sided Port-A-Cath with tip in the low SVC. There is a shallow inspiration. There are mild basilar opacities which are accentuated due to the shallow inspiration but there probably is a degree of basilar atelectasis or streaky basilar infiltrate which has worsened. No confluent airspace consolidation. No large effusions. Hilar and mediastinal contours are unremarkable and unchanged. Diffuse sclerotic skeletal metastases are again evident. IMPRESSION: 1. Linear bibasilar atelectasis or infiltrate, worsened although somewhat accentuated by the shallow inspiration. 2. No confluent airspace consolidation.  No effusion. 3. Diffuse sclerotic skeletal metastases. Electronically Signed   By: Andreas Newport M.D.   On: 04/20/2015 01:29    EKG: Independently reviewed.  QTC 438, T-wave inversion in V1-V3, which existed in previous EKG on 04/15/15  Assessment/Plan Principal Problem:   UTI (lower urinary tract infection) Active Problems:   Cancer of central portion of right female breast (HCC)   Constipation   Elevated liver enzymes   Brain metastases (HCC)   Chest pain   SOB (shortness of breath)   Sepsis (HCC)   Protein-calorie malnutrition, severe (HCC)  UTI (lower urinary tract infection) and sepsis due to UTI: patient has positive urinalysis with small amount  of leukocytes, and is symptomatic, consistent with UTI. Patient is septic on admission with elevated lactate 2.2, and tachycardia cardia, tachypnea. She is hemodynamically stable.   - Admit to telemetry - Levaquin was started by EDP, will continue  - Follow up results of urine and blood cx and amend antibiotic regimen if needed per sensitivity results - prn Zofran for nausea - will get Procalcitonin and trend lactic acid levels per sepsis protocol. -  IVF: 2.5L of NS bolus in  ED, followed by 75 cc/h   Diffusely metastasized breast cancer: s/p of left breast lumpectomy, and right mastectomy. S/p of radiation therapy. Patient had chemotherapy, last dose was 5 weeks ago. Patient is followed up by Dr. Lindi Adie.  -f/u with Dr. Cordelia Pen  Constipation: -MiraLAX and Colace  Elevated liver enzymes: due to metastasized cancer to liver. -Avoid tylenol  Chest pain and SOB: pt had CTA-chest on 04/15/15, which was negative for PE, but showed metastasized disease to lung, which most likely caused chest pain and shortness of breath. -DuoNeb nebulizers -Mucinex of a cough  Protein-calorie malnutrition, severe ([address]): -Ensure  -consult to Nutrition team  DVT ppx: SQ Lovenox  Code Status: DNR Family Communication:  Yes, patient's daughter at bed side Disposition Plan: Admit to inpatient   Date of Service 04/20/2015    Ivor Costa [facility] Pager [phone]  If 7PM-7AM, please contact night-coverage [email or web address] Password [email or web address] 04/20/2015, 2:28 AM

## 2015-04-20 NOTE — Progress Notes (Signed)
ANTIBIOTIC CONSULT NOTE - INITIAL  Pharmacy Consult for Levaquin Indication: UTI  Allergies  Allergen Reactions  . Penicillins Rash    Has patient had a PCN reaction causing immediate rash, facial/tongue/throat swelling, SOB or lightheadedness with hypotension: no Has patient had a PCN reaction causing severe rash involving mucus membranes or skin necrosis: no Has patient had a PCN reaction that required hospitalization no Has patient had a PCN reaction occurring within the last 10 years: yes If all of the above answers are "NO", then may proceed with Cephalosporin use.         Vital Signs: Temp: 98.7 F (37.1 C) (12/04 0343) Temp Source: Oral (12/04 0343) BP: 119/69 mmHg (12/04 0343) Pulse Rate: 106 (12/04 0343) Intake/Output from previous day:   Intake/Output from this shift:    Labs:  Recent Labs  04/19/15 2159 04/20/15 0303  WBC 4.9 5.5  HGB 11.0* 10.6*  PLT 206 205  CREATININE 0.71  --    Estimated Creatinine Clearance: 66.5 mL/min (by C-G formula based on Cr of 0.71). No results for input(s): VANCOTROUGH, VANCOPEAK, VANCORANDOM, GENTTROUGH, GENTPEAK, GENTRANDOM, TOBRATROUGH, TOBRAPEAK, TOBRARND, AMIKACINPEAK, AMIKACINTROU, AMIKACIN in the last 72 hours.   Microbiology: No results found for this or any previous visit (from the past 720 hour(s)).  Medical History: Past Medical History  Diagnosis Date  . Ovarian cyst   . Breast calcification, left   . Diverticulitis     when it flares pt. uses Probiotic, also use baking soda & honey everyday   . S/P radiation therapy 09/26/13-11/08/13    right breast  . Breast cancer ([place])     "right" (11/07/2012)    Medications:  Prescriptions prior to admission  Medication Sig Dispense Refill Last Dose  . Calcium Carbonate-Vit D-Min (CALCIUM 1200 PO) Take 1 tablet by mouth daily at 12 noon.    Past Week at Unknown time  . ibuprofen (ADVIL,MOTRIN) 200 MG tablet Take 400 mg by mouth every 4 (four) hours as needed for  moderate pain.    Past Month at Unknown time  . lidocaine-prilocaine (EMLA) cream Apply to affected area once 30 g 3 Past Month at Unknown time  . LORazepam (ATIVAN) 0.5 MG tablet TAKE 1 TABLET EVERY 6 HOURS AS NEEDED (Patient taking differently: TAKE 1 TABLET EVERY 6 HOURS AS NEEDED ANXEITY) 30 tablet 0 5 week at Unknown time  . morphine (MSIR) 15 MG tablet Take 1 tablet (15 mg total) by mouth every 6 (six) hours as needed for severe pain. 30 tablet 0 Past Week at Unknown time  . Multiple Vitamins-Minerals (MULTIVITAMIN PO) Take 1 tablet by mouth daily at 12 noon.    Past Week at Unknown time  . naproxen sodium (ANAPROX) 220 MG tablet Take 220 mg by mouth 2 (two) times daily as needed (pain).   Past Month at Unknown time  . ondansetron (ZOFRAN) 8 MG tablet Take 1 tablet (8 mg total) by mouth 2 (two) times daily. The day after chemo for 2 days, then every 8 hours PRN for N/V 30 tablet 1 unknown at Unknown time  . OVER THE COUNTER MEDICATION Take 4 tablets by mouth 2 (two) times daily. ([place])   Past Week at Unknown time  . oxyCODONE-acetaminophen (PERCOCET/ROXICET) 5-325 MG tablet Take 1-2 tablets by mouth every 6 (six) hours as needed. (Patient taking differently: Take 1-2 tablets by mouth every 6 (six) hours as needed for moderate pain. ) 60 tablet 0 04/19/2015 at Unknown time  . prochlorperazine (COMPAZINE) 10  MG tablet Take 1 tablet (10 mg total) by mouth every 6 (six) hours as needed (Nausea or vomiting). 30 tablet 1 unknown at Unknown time  . UNABLE TO FIND Q000111Q Silicone Breast Prosthesis, Rt quantity 1 L8020 Mastectomy form, Rt, quantity 1 L8000 Mastectomy bra, quantity 6   Taking  . UNABLE TO FIND 174.9 Malignant Neoplasm   Mastectomy Right  L8030-Silicone Breast Prosthesis-1  L8000- Mastectomy Bra-2 1 each 0 Taking  . dexamethasone (DECADRON) 4 MG tablet Take 1 tablet (4 mg total) by mouth 2 (two) times daily. (Patient not taking: Reported on 04/19/2015) 60 tablet 0 Not Taking at  Unknown time   Scheduled:  . calcium-vitamin D  1 tablet Oral Q1200  . dextromethorphan-guaiFENesin  1 tablet Oral BID  . docusate sodium  100 mg Oral BID  . enoxaparin (LOVENOX) injection  40 mg Subcutaneous Q24H  . feeding supplement (ENSURE ENLIVE)  237 mL Oral BID BM  . levofloxacin (LEVAQUIN) IV  750 mg Intravenous Q24H  . sodium chloride  2,000 mL Intravenous Once  . sodium chloride  3 mL Intravenous Q12H   Infusions:  . sodium chloride     Assessment: 35 yoF with metastatic breast Ca c/o weakness found to have UTI.  Levaquin per Rx.   Goal of Therapy:  Treat UTI  Plan:   Levaquin 750mg  IV q24h  F/u SCr/cultures as needed  Lawana Pai R 04/20/2015,3:58 AM

## 2015-04-20 NOTE — Progress Notes (Signed)
CRITICAL VALUE ALERT  Critical value received:  Lactic acid 2.2  Date of notification:  04/20/2015  Time of notification:  0411  Critical value read back:yes  Nurse who received alert: Ralph Benavidez   MD notified (1st page): Abrol  Time of first page:  0415  MD notified (2nd page): Abrol  Time of second page: 0515  Responding MD:  Allyson Sabal  Time MD responded:  0413 (stated patient needed to be admitted)

## 2015-04-20 NOTE — Evaluation (Signed)
Physical Therapy Evaluation Patient Details Name: [name] MRN: [id number] DOB: Jun 15, 1955 Today's Date: 04/20/2015   History of Present Illness  [name] is a 59 y.o. female with PMH of diffusely metastasized breast cancer, diverticulitis, who presents with dysuria, increased urinary frequency and shortness of breath.  Clinical Impression  Pt admitted with above diagnosis. Pt currently with functional limitations due to the deficits listed below (see PT Problem List).  Pt will benefit from skilled PT to increase their independence and safety with mobility to allow discharge to the venue listed below.   Pt limited  By fatigue and only able to transfer this am;     Follow Up Recommendations Home health PT;Supervision - Intermittent    Equipment Recommendations  None recommended by PT    Recommendations for Other Services       Precautions / Restrictions        Mobility  Bed Mobility Overal bed mobility: Needs Assistance Bed Mobility: Supine to Sit     Supine to sit: Min assist     General bed mobility comments: cues for task completion, assist with trunk to upright, incr time to scoot to EOB  Transfers Overall transfer level: Needs assistance Equipment used: 1 person hand held assist Transfers: Sit to/from [facility] Sit to Stand: Min assist Stand pivot transfers: Min assist       General transfer comment: incr time, assist to rise and stabilize  Ambulation/Gait Ambulation/Gait assistance: Min assist Ambulation Distance (Feet): 3 Feet Assistive device: 1 person hand held assist Gait Pattern/deviations: Wide base of support;Trunk flexed;Step-to pattern        Stairs            Wheelchair Mobility    Modified Rankin (Stroke Patients Only)       Balance Overall balance assessment: Needs assistance           Standing balance-Leahy Scale: Fair                               Pertinent Vitals/Pain Pain  Assessment: No/denies pain    Home Living Family/patient expects to be discharged to:: Private residence Living Arrangements: Children   Type of Home: House Home Access: Stairs to enter   Technical brewer of Steps: 1 Home Layout: One level Home Equipment: None Additional Comments: son travels for work; will be at home for next month per pt    Prior Function Level of Independence: Independent               Hand Dominance        Extremity/Trunk Assessment   Upper Extremity Assessment: Defer to OT evaluation           Lower Extremity Assessment: Generalized weakness         Communication   Communication: No difficulties  Cognition Arousal/Alertness: Awake/alert Behavior During Therapy: WFL for tasks assessed/performed Overall Cognitive Status: Within Functional Limits for tasks assessed                      General Comments      Exercises        Assessment/Plan    PT Assessment Patient needs continued PT services  PT Diagnosis Difficulty walking   PT Problem List Decreased strength;Decreased activity tolerance;Decreased mobility;Decreased balance  PT Treatment Interventions DME instruction;Gait training;Functional mobility training;Therapeutic exercise;Therapeutic activities;Patient/family education   PT Goals (Current goals can be found in the  Care Plan section) Acute Rehab PT Goals Patient Stated Goal: feel better PT Goal Formulation: With patient Time For Goal Achievement: 04/27/15 Potential to Achieve Goals: Good    Frequency Min 3X/week   Barriers to discharge        Co-evaluation               End of Session   Activity Tolerance: Patient limited by fatigue Patient left: in chair;with call bell/phone within reach Nurse Communication: Mobility status         Time: [id number] PT Time Calculation (min) (ACUTE ONLY): 16 min   Charges:   PT Evaluation $Initial PT Evaluation Tier I: 1 Procedure     PT G  CodesKenyon Foster 04/24/2015, 11:35 AM

## 2015-04-20 NOTE — Progress Notes (Signed)
Sitting in chair, vital stable, reported chronic vision blurry, denies headache, some nausea, no appetite, reported feeling nausous after eating regular diet, agreed to change to soft diet, reported chronic lower extremity edema. Continue abx, on ivf, monitor fluids balance. Will discuss with oncology , per patient she is scheduled to see rad onc for brain mets.

## 2015-04-21 ENCOUNTER — Ambulatory Visit: Payer: 59

## 2015-04-21 ENCOUNTER — Other Ambulatory Visit: Payer: 59

## 2015-04-21 ENCOUNTER — Encounter: Payer: 59 | Admitting: Hematology and Oncology

## 2015-04-21 ENCOUNTER — Telehealth: Payer: Self-pay | Admitting: *Deleted

## 2015-04-21 DIAGNOSIS — Z515 Encounter for palliative care: Secondary | ICD-10-CM

## 2015-04-21 DIAGNOSIS — C792 Secondary malignant neoplasm of skin: Secondary | ICD-10-CM

## 2015-04-21 DIAGNOSIS — C7951 Secondary malignant neoplasm of bone: Secondary | ICD-10-CM

## 2015-04-21 DIAGNOSIS — G893 Neoplasm related pain (acute) (chronic): Secondary | ICD-10-CM

## 2015-04-21 DIAGNOSIS — C7931 Secondary malignant neoplasm of brain: Secondary | ICD-10-CM

## 2015-04-21 DIAGNOSIS — R197 Diarrhea, unspecified: Secondary | ICD-10-CM

## 2015-04-21 DIAGNOSIS — C50111 Malignant neoplasm of central portion of right female breast: Secondary | ICD-10-CM

## 2015-04-21 DIAGNOSIS — C787 Secondary malignant neoplasm of liver and intrahepatic bile duct: Secondary | ICD-10-CM

## 2015-04-21 DIAGNOSIS — Z66 Do not resuscitate: Secondary | ICD-10-CM

## 2015-04-21 LAB — COMPREHENSIVE METABOLIC PANEL
ALT: 81 U/L — ABNORMAL HIGH (ref 14–54)
ANION GAP: 11 (ref 5–15)
AST: 275 U/L — AB (ref 15–41)
Albumin: 2 g/dL — ABNORMAL LOW (ref 3.5–5.0)
Alkaline Phosphatase: 2128 U/L — ABNORMAL HIGH (ref 38–126)
BILIRUBIN TOTAL: 4.1 mg/dL — AB (ref 0.3–1.2)
BUN: 16 mg/dL (ref 6–20)
CHLORIDE: 108 mmol/L (ref 101–111)
CO2: 24 mmol/L (ref 22–32)
Calcium: 8 mg/dL — ABNORMAL LOW (ref 8.9–10.3)
Creatinine, Ser: 0.49 mg/dL (ref 0.44–1.00)
Glucose, Bld: 102 mg/dL — ABNORMAL HIGH (ref 65–99)
POTASSIUM: 5.1 mmol/L (ref 3.5–5.1)
Sodium: 143 mmol/L (ref 135–145)
TOTAL PROTEIN: 5.6 g/dL — AB (ref 6.5–8.1)

## 2015-04-21 LAB — CBC
HEMATOCRIT: 35.5 % — AB (ref 36.0–46.0)
Hemoglobin: 10.6 g/dL — ABNORMAL LOW (ref 12.0–15.0)
MCH: 25.1 pg — AB (ref 26.0–34.0)
MCHC: 29.9 g/dL — AB (ref 30.0–36.0)
MCV: 84.1 fL (ref 78.0–100.0)
PLATELETS: 224 10*3/uL (ref 150–400)
RBC: 4.22 MIL/uL (ref 3.87–5.11)
RDW: 21.2 % — AB (ref 11.5–15.5)
WBC: 6.6 10*3/uL (ref 4.0–10.5)

## 2015-04-21 LAB — URINE CULTURE: Culture: 1000

## 2015-04-21 LAB — MAGNESIUM: MAGNESIUM: 2.1 mg/dL (ref 1.7–2.4)

## 2015-04-21 MED ORDER — SODIUM CHLORIDE 0.9 % IJ SOLN: 10.0000 mL | INTRAMUSCULAR | Status: DC | PRN

## 2015-04-21 MED ORDER — SODIUM CHLORIDE 0.9 % IV SOLN
Freq: Once | INTRAVENOUS | Status: DC
  Filled 2015-04-21: qty 4

## 2015-04-21 MED ORDER — SODIUM CHLORIDE 0.9 % IV SOLN: Freq: Once | INTRAVENOUS | Status: DC

## 2015-04-21 MED ORDER — LORAZEPAM 0.5 MG PO TABS: 0.5000 mg | ORAL_TABLET | ORAL | Status: DC | PRN

## 2015-04-21 MED ORDER — OXYCODONE-ACETAMINOPHEN 5-325 MG PO TABS
1.0000 | ORAL_TABLET | ORAL | Status: DC | PRN
  Administered 2015-04-21 – 2015-04-22 (×2): 1 via ORAL
  Filled 2015-04-21 (×2): qty 1

## 2015-04-21 MED ORDER — SODIUM CHLORIDE 0.9 % IJ SOLN: 3.0000 mL | INTRAMUSCULAR | Status: DC | PRN

## 2015-04-21 MED ORDER — ALTEPLASE 2 MG IJ SOLR
2.0000 mg | Freq: Once | INTRAMUSCULAR | Status: DC | PRN
  Filled 2015-04-21: qty 2

## 2015-04-21 MED ORDER — COLD PACK MISC ONCOLOGY
1.0000 | Freq: Once | Status: DC | PRN
  Filled 2015-04-21: qty 1

## 2015-04-21 MED ORDER — SODIUM CHLORIDE 0.9 % IV SOLN: 800.0000 mg/m2 | Freq: Once | INTRAVENOUS | Status: DC

## 2015-04-21 MED ORDER — HEPARIN SOD (PORK) LOCK FLUSH 100 UNIT/ML IV SOLN
500.0000 [IU] | Freq: Once | INTRAVENOUS | Status: DC | PRN
  Filled 2015-04-21: qty 5

## 2015-04-21 MED ORDER — LOPERAMIDE HCL 2 MG PO CAPS: 4.0000 mg | ORAL_CAPSULE | ORAL | Status: DC | PRN

## 2015-04-21 MED ORDER — HEPARIN SOD (PORK) LOCK FLUSH 100 UNIT/ML IV SOLN
250.0000 [IU] | Freq: Once | INTRAVENOUS | Status: DC | PRN
  Filled 2015-04-21: qty 3

## 2015-04-21 NOTE — Progress Notes (Signed)
HEMATOLOGY-ONCOLOGY PROGRESS NOTE  SUBJECTIVE:  Having recurrent diarrhea  OBJECTIVE: PHYSICAL EXAMINATION: ECOG PERFORMANCE STATUS: 3 - Symptomatic, >50% confined to bed  Filed Vitals:   04/20/15 2133 04/21/15 0458  BP: 123/67 116/57  Pulse: 98 94  Temp: 98.5 F (36.9 C) 97.8 F (36.6 C)  Resp: 20 18   Filed Weights   04/20/15 0344  Weight: 145 lb 15.1 oz (66.2 kg)    GENERAL:alert, no distress and comfortable SKIN: skin color, texture, turgor are normal, no rashes or significant lesions EYES: normal, Conjunctiva are pink and non-injected, sclera clear OROPHARYNX:no exudate, no erythema and lips, buccal mucosa, and tongue normal  NECK: supple, thyroid normal size, non-tender, without nodularity LYMPH:  no palpable lymphadenopathy in the cervical, axillary or inguinal LUNGS: clear to auscultation and percussion with normal breathing effort HEART: regular rate & rhythm and no murmurs and no lower extremity edema ABDOMEN: diarrhea Musculoskeletal:no cyanosis of digits and no clubbing  NEURO:  Neck pain and lower extremity weakness  LABORATORY DATA:  I have reviewed the data as listed CMP Latest Ref Rng 04/21/2015 04/20/2015 04/19/2015  Glucose 65 - 99 mg/dL 102(H) 106(H) 168(H)  BUN 6 - 20 mg/dL 16 18 20   Creatinine 0.44 - 1.00 mg/dL 0.49 0.62 0.71  Sodium 135 - 145 mmol/L 143 143 139  Potassium 3.5 - 5.1 mmol/L 5.1 4.7 4.4  Chloride 101 - 111 mmol/L 108 102 100(L)  CO2 22 - 32 mmol/L 24 29 28   Calcium 8.9 - 10.3 mg/dL 8.0(L) 8.6(L) 8.7(L)  Total Protein 6.5 - 8.1 g/dL 5.6(L) 6.1(L) 6.3(L)  Total Bilirubin 0.3 - 1.2 mg/dL 4.1(H) 3.3(H) 3.2(H)  Alkaline Phos 38 - 126 U/L 2128(H) 1764(H) 1732(H)  AST 15 - 41 U/L 275(H) 271(H) 278(H)  ALT 14 - 54 U/L 81(H) 81(H) 80(H)    Lab Results  Component Value Date   WBC 6.6 04/21/2015   HGB 10.6* 04/21/2015   HCT 35.5* 04/21/2015   MCV 84.1 04/21/2015   PLT 224 04/21/2015   NEUTROABS 5.5 04/15/2015    ASSESSMENT AND  PLAN: 1.  Metastatic breast cancer with extensive liver metastases and bone metastases and cutaneous metastases  And brain metastases: after lengthy discussions, patient elected to got full hospice care without any additional chemotherapy 2.  I informed radiation oncology regarding her plan  Patient to follow with hospice care  Patient's daughter was also in the room  during this discussion.

## 2015-04-21 NOTE — Progress Notes (Signed)
Patient was scheduled to receive chemotherapy as an outpatient with gemcitabine.  Unfortunately she has been admitted to the hospital.  Because her disease is so rapidly progressing in the liver , I would not like to wait until she gets discharged to initiate her palliative chemotherapy.  I will like to request her inpatient pharmacy 2 allow [place] to give gemcitabine first dose as an inpatient.  She is okay to treat in spite of having a bilirubin of 4.1.  Her CBC was reviewed and she has adequate blood counts to treat her with gemcitabine.  Nicholas Lose MD

## 2015-04-21 NOTE — Progress Notes (Signed)
Physical Therapy Treatment Patient Details Name: [name] MRN: [id number] DOB: 1955-07-22 Today's Date: 04/21/2015    History of Present Illness SILPA BARHAM is a 59 y.o. female with PMH of diffusely metastasized breast cancer, diverticulitis, who presents with dysuria, increased urinary frequency and shortness of breath.    PT Comments    Pt. Mobility limited by bowel incontinence; required min-mod assist to bring trunk upright and min assist with increased time to scoot to EOB; sit -> stand with supervision for safety and SPT with min assist for controlled descent as pt. Does not ensure seated surface is behind her upon sitting; modified independent with sit -> supine but required assist and use of bed pad to scoot up in bed   Follow Up Recommendations  Home health PT;Supervision - Intermittent     Equipment Recommendations  None recommended by PT    Recommendations for Other Services       Precautions / Restrictions Precautions Precautions: None    Mobility  Bed Mobility Overal bed mobility: Needs Assistance Bed Mobility: Supine to Sit;Sit to Supine     Supine to sit: Min assist;Mod assist Sit to supine: Min assist   General bed mobility comments: assist with trunk to upright, increased time to scoot to EOB; no physical assist for sit - supine but requires assist with use of bed pad to scoot up in bed   Transfers Overall transfer level: Modified independent Equipment used: None Transfers: Sit to/from [facility] Sit to Stand: Min guard;Min assist Stand pivot transfers: Min guard;Min assist       General transfer comment: min assist for controlled descent; increased time to rise and stablize   Ambulation/Gait    unable to attempt amb due to loose stools         General Gait Details: did not attempt today due to incontinence of bowel    Stairs            Wheelchair Mobility    Modified Rankin (Stroke Patients Only)        Balance Overall balance assessment: Needs assistance Sitting-balance support: Bilateral upper extremity supported;Feet supported Sitting balance-Leahy Scale: Fair       Standing balance-Leahy Scale: Fair                      Cognition Arousal/Alertness: Awake/alert Behavior During Therapy: WFL for tasks assessed/performed Overall Cognitive Status: Within Functional Limits for tasks assessed                      Exercises      General Comments        Pertinent Vitals/Pain Pain Assessment: No/denies pain    Home Living                      Prior Function            PT Goals (current goals can now be found in the care plan section) Acute Rehab PT Goals Patient Stated Goal: Go home when able; feel better Time For Goal Achievement: 04/27/15 Potential to Achieve Goals: Good Progress towards PT goals: Progressing toward goals    Frequency  Min 3X/week    PT Plan Current plan remains appropriate    Co-evaluation             End of Session Equipment Utilized During Treatment: Gait belt Activity Tolerance: Other (comment) (incontinence of bowel ) Patient left: in bed;with nursing/sitter in  room;with call bell/phone within reach (MD in room; notified NT bed alarm not set)     Time: [id number] PT Time Calculation (min) (ACUTE ONLY): 20 min  Charges:  $Therapeutic Activity: 8-22 mins                    G CodesDenna Haggard, SPTA   04/21/2015 12:57 PM   Pager: [phone]   Reviewed and agree with above Rica Koyanagi  PTA [facility]  Acute  Rehab Pager      [phone]

## 2015-04-21 NOTE — Progress Notes (Signed)
Notified by [facility] of family request for [facility] [place] [facility] at home after discharge. Chart and patient information currently under review to confirm hospice eligibility.   Spoke with daughter Harmon Pier via phone conversation to initiate education related to hospice philosophy, services and team approach to care. Family verbalized understanding of the information provided. Per discussion plan is for discharge to home tomorrow.   Please send signed completed DNR form home with patient.  Patient will need prescriptions for discharge comfort medications.   DME needs discussed and family requested hospital bed with 1/2 rails, bedside table and 3 in 1 for delivery to the home today.  The home address has been verified and is correct in the chart; Harmon Pier family member to be contacted to arrange time of delivery.   Completed discharge summary will need to be faxed to [facility] at [phone] when final.  Please notify [facility] when patient is ready to leave unit at discharge-call [phone].  [facility] information and contact numbers have been given to [facility] during visit.   Please call with any questions.  Thank You,  Freddi Starr RN, [facility] Liaison  [phone]

## 2015-04-21 NOTE — Evaluation (Signed)
Occupational Therapy Evaluation Patient Details Name: [name] MRN: [id number] DOB: [birth year] Today's Date: 04/21/2015    History of Present Illness [name] is a 59 y.o. female with PMH of diffusely metastasized breast cancer, diverticulitis, who presents with dysuria, increased urinary frequency and shortness of breath.   Clinical Impression   Pt admitted as above and currently demonstrates deficits in ability to perform ADL's/self care and functional mobility (see OT problem list below). She should benefit from acute OT to assist in maximizing independence in these areas prior to anticipated d/c home w/ intermittent assistance & decrease burden of care to family.    Follow Up Recommendations  Home health OT;Supervision - Intermittent    Equipment Recommendations  3 in 1 bedside comode    Recommendations for Other Services       Precautions / Restrictions        Mobility Bed Mobility Overal bed mobility: Needs Assistance Bed Mobility: Supine to Sit     Supine to sit: Min assist     General bed mobility comments: cues for task completion, assist with trunk to upright, incr time to scoot to EOB  Transfers Overall transfer level: Needs assistance Equipment used: 1 person hand held assist Transfers: Sit to/from Stand;Stand Pivot Transfers Sit to Stand: Min assist Stand pivot transfers: Min assist       General transfer comment: incr time, assist to rise and stabilize    Balance Overall balance assessment: Needs assistance Sitting-balance support: Bilateral upper extremity supported;Feet supported Sitting balance-Leahy Scale: Fair       Standing balance-Leahy Scale: Fair                              ADL Overall ADL's : Needs assistance/impaired Eating/Feeding: Set up;Modified independent;Sitting   Grooming: Wash/dry hands;Set up;Sitting   Upper Body Bathing: Set up;Sitting;Min guard   Lower Body Bathing: Minimal  assistance;Sitting/lateral leans;Sit to/from stand;Cueing for safety   Upper Body Dressing : Set up;Supervision/safety;Sitting   Lower Body Dressing: Minimal assistance;Sitting/lateral leans;Sit to/from stand;Cueing for safety;Cueing for sequencing   Toilet Transfer: Minimal assistance;Stand-pivot;BSC;Ambulation;Cueing for safety;Cueing for sequencing   Toileting- Clothing Manipulation and Hygiene: Moderate assistance;Sitting/lateral lean;Sit to/from stand;Cueing for safety       Functional mobility during ADLs: Minimal assistance;Cueing for safety;Cueing for sequencing General ADL Comments: Pt was assessed for OT and educated in Role of OT. She reluctantly participated in ADL retraining session initially citing fatigue and inability, but was convinced after having had a BM in bed - agreed to clean up and transfer to chair w/ HHA & SPT where she again had BM in chair and required Mod A for toileting/hygiene and transfer to 3:1 beside recliner. Pt presents with overall weakness and deconditioning and should benefit from acute OT to assist in maximizing independence with ADL's and self care tasks as she plans to d/c home w/ son when medically able.      Vision  Wears glasses for reading only. No change from baseline per pt report.   Perception     Praxis      Pertinent Vitals/Pain Pain Assessment: No/denies pain     Hand Dominance Right   Extremity/Trunk Assessment Upper Extremity Assessment Upper Extremity Assessment: Generalized weakness (Grossly 3/5 bilateral UE's)   Lower Extremity Assessment Lower Extremity Assessment: Defer to PT evaluation       Communication Communication Communication: No difficulties   Cognition Arousal/Alertness: Awake/alert Behavior During Therapy: [facility] for tasks  assessed/performed Overall Cognitive Status: Within Functional Limits for tasks assessed                     General Comments       Exercises       Shoulder Instructions       Home Living Family/patient expects to be discharged to:: Private residence Living Arrangements: Children Available Help at Discharge: Family Type of Home: House Home Access: Stairs to enter Technical brewer of Steps: 1   Home Layout: One level     Bathroom Shower/Tub: Teacher, early years/pre: Standard     Home Equipment: Building services engineer Comments: son travels for work; will be at home for next month per pt      Prior Functioning/Environment Level of Independence: Independent             OT Diagnosis: Generalized weakness   OT Problem List: Decreased strength;Decreased activity tolerance;Decreased knowledge of precautions;Decreased knowledge of use of DME or AE;Impaired balance (sitting and/or standing)   OT Treatment/Interventions: Self-care/ADL training;Energy conservation;DME and/or AE instruction;Patient/family education;Therapeutic activities    OT Goals(Current goals can be found in the care plan section) Acute Rehab OT Goals Patient Stated Goal: Go home when able; feel better Time For Goal Achievement: 05/05/15 Potential to Achieve Goals: Good  OT Frequency: Min 2X/week   Barriers to D/C:            Co-evaluation              End of Session Equipment Utilized During Treatment: Gait belt;Other (comment) (hand held assist; increased time for tasks and vc's) Nurse Communication: Mobility status;Other (comment) (BM x2 (soiled chair and bed then in 3:1))  Activity Tolerance: Patient limited by fatigue Patient left: in chair;with call bell/phone within reach;with chair alarm set   Time: [phone] OT Time Calculation (min): 28 min Charges:  OT General Charges $OT Visit: 1 Procedure OT Evaluation $Initial OT Evaluation Tier I: 1 Procedure OT Treatments $Self Care/Home Management : 8-22 mins G-Codes:    Almyra Deforest, OTR/L 04/21/2015, 9:29 AM

## 2015-04-21 NOTE — Progress Notes (Signed)
Patient now want home hospice, appreciate palliative care and oncology input. Social worker consulted and home tomorrow with home hospice.

## 2015-04-21 NOTE — Progress Notes (Signed)
Initial Nutrition Assessment  DOCUMENTATION CODES:   Not applicable  INTERVENTION:  - Continue Ensure Enlive BID, each supplement provides 350 kcal and 20 grams of protein (please mix with milk to cut sweetness) - Encourage PO intakes as tolerated - RD will continue to monitor for needs  NUTRITION DIAGNOSIS:   Increased nutrient needs related to catabolic illness, cancer and cancer related treatments as evidenced by estimated needs.  GOAL:   Patient will meet greater than or equal to 90% of their needs  MONITOR:   PO intake, Supplement acceptance, Weight trends, Labs, Skin, I & O's  REASON FOR ASSESSMENT:   Malnutrition Screening Tool, Consult Assessment of nutrition requirement/status  ASSESSMENT:   59 y.o. female with PMH of diffusely metastasized breast cancer, diverticulitis, who presents with dysuria, increased urinary frequency and shortness of breath.  Pt seen for MST and consult. BMI indicates normal weight. Per chart review, pt ate 25% of breakfast and 50% of lunch and dinner yesterday. Pt reports she ate a little bit of breakfast this AM. Visualized lunch tray with ~85% completion.   Pt states that she had nausea-type sensation earlier in the day but has not been experiencing this feeling this afternoon. Pt states that for approximately 1 week PTA she was experiencing decreased appetite and not feeling well. She states that she did best with items such as soup because if she did not feel capable of chewing she was able to drink it. She states ongoing taste alteration in the form of lack of taste or overly spicy taste.   Pt denies chewing or swallowing difficulties. She was not drinking nutrition supplements PTA. She likes the Ensure but states it has been too sweet; she is interested in trying it mixed with milk to decrease sweetness. Pt states that her weight was fluctuating 3-4 lbs PTA. Per chart review, pt has lost 5 lbs (3.3% body weight) in the past 3 months which  is not significant for time frame. Did not perform physical assessment at this time as pt states not feeling well. Will perform at follow-up. Unable to confirm or rule-out malnutrition at this time.    MD note from today at 1307 indicates: Metastatic breast cancer with extensive liver metastases and bone metastases and cutaneous metastases And brain metastases: after lengthy discussions, patient elected to got full hospice care without any additional chemotherapy.  Pt likely not meeting needs. Medications reviewed. Labs reviewed; Ca: 8 mg/dL, LFTs elevated.   Diet Order:  DIET SOFT Room service appropriate?: Yes; Fluid consistency:: Thin  Skin:  Reviewed, no issues  Last BM:  12/5  Height:   Ht Readings from Last 1 Encounters:  04/20/15 5\' 5"  (1.651 m)    Weight:   Wt Readings from Last 1 Encounters:  04/20/15 145 lb 15.1 oz (66.2 kg)    Ideal Body Weight:  56.82 kg (kg)  BMI:  Body mass index is 24.29 kg/(m^2).  Estimated Nutritional Needs:   Kcal:  2000-2200  Protein:  70-80 grams  Fluid:  2 L/day  EDUCATION NEEDS:   No education needs identified at this time     Jarome Matin, RD, LDN Inpatient Clinical Dietitian Pager # [phone] After hours/weekend pager # [phone]

## 2015-04-21 NOTE — Telephone Encounter (Signed)
Hospice called for Dr. Geralyn Flash nurse.  With call transfer, voicemail received.  Offered help to Amy RN said, she wants to leave message.

## 2015-04-21 NOTE — Care Management Note (Signed)
Case Management Note  Patient Details  Name: MINDIE RAWDON MRN: [id number] Date of Birth: [birth year]  Subjective/Objective:         59 yo admitted with UTI, Hx of breast cancer           Action/Plan: From home  Expected Discharge Date:                  Expected Discharge Plan:  Home w Hospice Care  In-House Referral:     Discharge planning Services  CM Consult  Post Acute Care Choice:  Hospice Choice offered to:  Patient, Adult Children  DME Arranged:    DME Agency:     HH Arranged:  Disease Management [place] Agency:  [facility] [place] [place]  Status of Service:  In process, will continue to follow  Medicare Important Message Given:    Date Medicare IM Given:    Medicare IM give by:    Date Additional Medicare IM Given:    Additional Medicare Important Message give by:     If discussed at [place] of Stay Meetings, dates discussed:    Additional Comments: CM referral for home hospice choice. This CM met with pt, daughter and cousin at bedside to offer choice for home hospice services. Pt chooses [facility]. [facility] referral line called to give referral. Pt asking for hospital bed and 3 in 1 at this time. This was expressed to [facility] rep during referral. CM will continue to follow and assist as needed. Lynnell Catalan, RN 04/21/2015, 2:35 PM

## 2015-04-21 NOTE — Consult Note (Signed)
Consultation Note Date: 04/21/2015   Patient Name: [name]  DOB: Jun 06, 1955  MRN: [id number]  Age / Sex: 59 y.o., female  PCP: Nicholas Lose, MD Referring Physician: Florencia Reasons, MD  Reason for Consultation: Establishing goals of care   Clinical Assessment/Narrative:  Metastatic breast cancer with extensive bone metastases including the axial and appendicular skeleton with a prior history of right breast cancer T2 N1 ER/PR positive HER-2 negative, biopsy-proven metastatic disease, patient previously refused adjuvant antiestrogen therapy and adjuvant chemotherapy. Treated with Ibrance plus letrozole plus X [place] since 03/12/2014. PET/CT 07/22/2014 revealed multiple lymph nodes, multiple liver lesions, adrenal lesions, widespread bone metastases, started [place] 08/09/2014  PET/CT scan 12/06/2014: Monitor response to therapy with resolution of hypermetabolic nodal metastases within the neck and the chest, improvement to resolution of hypermetabolic liver metastases, resolved right and left adrenal metastases, near complete resolution of widespread bony metastases.  Bone scan 02/14/2015: Marked improvement in the appearance skeleton there remains abnormal uptake in the humeri, femurs and within ribs overall dramatic response to therapy  CT CAP 02/14/2015: Difficult interpretation.? A few lesions within both left and right hepatic lobes appear to be new or increased in size from prior exam,? New sclerotic lesions in humeral heads bilaterally. 6 mm left lower lobe pulmonary nodule  Cutaneous lesions: biopsy proven metastatic breast cancer 01/27/2015. Multiple cutaneous metastases are palpable on the neck   PET CT scan 04/03/2015: Moderate progression of hypermetabolic metastatic disease with heavy geographic metastatic disease throughout the liver, scattered subcutaneous and intramuscular hypermetabolic nodules in the neck  and chest, multifocal diffuse hypermetabolic metastatic disease to the axial and appendicular skeleton increased from before.  CT brain: [place] 26 2016:2 hyperdense foci one within the deep right temporal lobe and second within the brainstem concerning for intracranial metastasis.  Continued physical and  functional decline.  Patient is faced with advanced directive decisions and anticipatory care needs.  This NP Wadie Lessen reviewed medical records, received report from team, assessed the patient and then meet at the patient's bedside along with her daughter Harmon Pier and a neice  to discuss diagnosis, prognosis, GOC, EOL wishes disposition and options.   A detailed discussion was had today regarding advanced directives.  Concepts specific to code status, artifical feeding and hydration, continued IV antibiotics and rehospitalization was had.  The difference between a aggressive medical intervention path  and a palliative comfort care path for this patient at this time was had.  Values and goals of care important to patient and family were attempted to be elicited.  Discussed her desire for no further chemotherapy or radiation and other life prolonging measures  Concept of Hospice and Palliative Care were discussed  Natural trajectory and expectations at EOL were discussed.  Questions and concerns addressed.  Family encouraged to call with questions or concerns.  PMT will continue to support holistically.     Primary Decision Maker: self    SUMMARY OF RECOMMENDATIONS  - patient tells me "I know I'm dying and don't have much time", - desires to go home with hospice services   Code Status/Advance Care Planning:  DNR      Code Status Orders        Start     Ordered   04/20/15 0218  Do not attempt resuscitation (DNR)   Continuous    Question Answer Comment  In the event of cardiac or respiratory ARREST Do not call a "code blue"   In the event of cardiac or respiratory ARREST Do not  perform Intubation, CPR, defibrillation or ACLS   In the event of cardiac or respiratory ARREST Use medication by any route, position, wound care, and other measures to relive pain and suffering. May use oxygen, suction and manual treatment of airway obstruction as needed for comfort.      04/20/15 0218    Advance Directive Documentation        Most Recent Value   Type of Advance Directive  Healthcare Power of Attorney   Pre-existing out of facility DNR order (yellow form or pink MOST form)     "MOST" Form in Place?        Other Directives:None  Symptom Management:    Pain:  Per patient Percocet 1 tablet is controlling her pain, decreased intervals to every 4 hrs  Anxiety: Ativan 0.5 mg po/sl every 4 hrs prn  Palliative Prophylaxis:    Bowel Regimen, Delirium Protocol, Frequent Pain Assessment and Oral Care  Additional Recommendations (Limitations, Scope, Preferences):  Once discharged:  Avoid Hospitalization, Full Comfort Care, No Artificial Feeding, No Blood Transfusions, No Chemotherapy, No Diagnostics, No Glucose Monitoring, No IV Antibiotics, No IV Fluids and No Lab Draws  Psycho-social/Spiritual:  Support System: Strong  Additional Recommendations: Education on Hospice  Prognosis:  weeks  Discharge Planning: Home with Hospice, will write for choice.  Family is hopeful for discharge tomorrow afternoon via non emergent transport   Chief Complaint/ Primary Diagnoses: Present on Admission:  . Elevated liver enzymes . Constipation . Cancer of central portion of right female breast ([place]) . Brain metastases ([place]) . Chest pain . SOB (shortness of breath) . UTI (lower urinary tract infection) . Sepsis ([place]) . Protein-calorie malnutrition, severe ([place])  I have reviewed the medical record, interviewed the patient and family, and examined the patient. The following aspects are pertinent.  Past Medical History  Diagnosis Date  . Ovarian cyst   . Breast  calcification, left   . Diverticulitis     when it flares pt. uses Probiotic, also use baking soda & honey everyday   . S/P radiation therapy 09/26/13-11/08/13    right breast  . Breast cancer ([place])     "right" (11/07/2012)   Social History   Social History  . Marital Status: Single    Spouse Name: N/A  . Number of Children: 2  . Years of Education: N/A   Occupational History  .  [employer]    flexible nurse technician   Social History Main Topics  . Smoking status: Never Smoker   . Smokeless tobacco: Never Used  . Alcohol Use: No  . Drug Use: No  . Sexual Activity:    Partners: Male   Other Topics Concern  . None   Social History Narrative   Family History  Problem Relation Age of Onset  . Cancer Mother     ovarian  . Cancer Brother     bone  . Colon cancer Neg Hx    Scheduled Meds: . calcium-vitamin D  1 tablet Oral Q1200  . dexamethasone  4 mg Oral BID  . dextromethorphan-guaiFENesin  1 tablet Oral BID  . docusate sodium  100 mg Oral BID  . enoxaparin (LOVENOX) injection  40 mg Subcutaneous Q24H  . feeding supplement (ENSURE ENLIVE)  237 mL Oral BID BM  . levofloxacin (LEVAQUIN) IV  750 mg Intravenous Q24H  . sodium chloride  3 mL Intravenous Q12H   Continuous Infusions: . sodium chloride 75 mL/hr at 04/20/15 0807   PRN Meds:.alum & mag hydroxide-simeth,  ibuprofen, ipratropium-albuterol, LORazepam, morphine, ondansetron, oxyCODONE-acetaminophen, polyethylene glycol Medications Prior to Admission:  Prior to Admission medications   Medication Sig Start Date End Date Taking? Authorizing Provider  Calcium Carbonate-Vit D-Min (CALCIUM 1200 PO) Take 1 tablet by mouth daily at 12 noon.    Yes Historical Provider, MD  ibuprofen (ADVIL,MOTRIN) 200 MG tablet Take 400 mg by mouth every 4 (four) hours as needed for moderate pain.    Yes Historical Provider, MD  lidocaine-prilocaine (EMLA) cream Apply to affected area once 07/26/14  Yes Nicholas Lose, MD  LORazepam  (ATIVAN) 0.5 MG tablet TAKE 1 TABLET EVERY 6 HOURS AS NEEDED Patient taking differently: TAKE 1 TABLET EVERY 6 HOURS AS NEEDED ANXEITY 08/09/14  Yes Nicholas Lose, MD  morphine (MSIR) 15 MG tablet Take 1 tablet (15 mg total) by mouth every 6 (six) hours as needed for severe pain. 04/15/15  Yes Quintella Reichert, MD  Multiple Vitamins-Minerals (MULTIVITAMIN PO) Take 1 tablet by mouth daily at 12 noon.    Yes Historical Provider, MD  naproxen sodium (ANAPROX) 220 MG tablet Take 220 mg by mouth 2 (two) times daily as needed (pain).   Yes Historical Provider, MD  ondansetron (ZOFRAN) 8 MG tablet Take 1 tablet (8 mg total) by mouth 2 (two) times daily. The day after chemo for 2 days, then every 8 hours PRN for N/V 07/26/14  Yes Nicholas Lose, MD  OVER THE COUNTER MEDICATION Take 4 tablets by mouth 2 (two) times daily. ([place])   Yes Historical Provider, MD  oxyCODONE-acetaminophen (PERCOCET/ROXICET) 5-325 MG tablet Take 1-2 tablets by mouth every 6 (six) hours as needed. Patient taking differently: Take 1-2 tablets by mouth every 6 (six) hours as needed for moderate pain.  04/07/15  Yes Nicholas Lose, MD  prochlorperazine (COMPAZINE) 10 MG tablet Take 1 tablet (10 mg total) by mouth every 6 (six) hours as needed (Nausea or vomiting). 07/26/14  Yes Nicholas Lose, MD  UNABLE TO FIND X8338 Silicone Breast Prosthesis, Rt quantity 1 L8020 Mastectomy form, Rt, quantity 1 L8000 Mastectomy bra, quantity 6   Yes Historical Provider, MD  UNABLE TO FIND 174.9 Malignant Neoplasm   Mastectomy Right  L8030-Silicone Breast Prosthesis-1  L8000- Mastectomy Bra-2 11/01/13  Yes Fanny Skates, MD  dexamethasone (DECADRON) 4 MG tablet Take 1 tablet (4 mg total) by mouth 2 (two) times daily. Patient not taking: Reported on 04/19/2015 03/28/15   Nicholas Lose, MD   Allergies  Allergen Reactions  . Penicillins Rash    Has patient had a PCN reaction causing immediate rash, facial/tongue/throat swelling, SOB or lightheadedness  with hypotension: no Has patient had a PCN reaction causing severe rash involving mucus membranes or skin necrosis: no Has patient had a PCN reaction that required hospitalization no Has patient had a PCN reaction occurring within the last 10 years: yes If all of the above answers are "NO", then may proceed with Cephalosporin use.     Review of Systems  Constitutional: Positive for activity change, appetite change and fatigue.  HENT:       Dry mouth    Physical Exam  Constitutional: She is oriented to person, place, and time. She appears ill.  HENT:  Mouth/Throat: Mucous membranes are dry. No oropharyngeal exudate.  Respiratory: She has decreased breath sounds in the right lower field and the left lower field.  GI: Soft. There is generalized tenderness.  Neurological: She is alert and oriented to person, place, and time.  Skin: Skin is warm and dry.  Noted cutaneous  lesions    Vital Signs: BP 116/57 mmHg  Pulse 94  Temp(Src) 97.8 F (36.6 C) (Oral)  Resp 18  Ht _0  (1.651 m)  Wt 66.2 kg (145 lb 15.1 oz)  BMI 24.29 kg/m2  SpO2 93%  SpO2: SpO2: 93 % O2 Device:SpO2: 93 % O2 Flow Rate: .   IO: Intake/output summary:  Intake/Output Summary (Last 24 hours) at 04/21/15 1014 Last data filed at 04/20/15 1810  Gross per 24 hour  Intake   1140 ml  Output      0 ml  Net   1140 ml    LBM: Last BM Date: 04/21/15 Baseline Weight: Weight: 66.2 kg (145 lb 15.1 oz) Most recent weight: Weight: 66.2 kg (145 lb 15.1 oz)      Palliative Assessment/Data:  Flowsheet Rows        Most Recent Value   Intake Tab    Referral Department  Hospitalist   Unit at Time of Referral  Med/Surg Unit   Palliative Care Primary Diagnosis  Cancer   Date Notified  04/21/15   Palliative Care Type  New Palliative care   Reason for referral  Clarify Goals of Care   Date of Admission  04/19/15   Date first seen by Palliative Care  04/21/15   # of days Palliative referral response time  0 Day(s)    # of days IP prior to Palliative referral  2   Clinical Assessment    Palliative Performance Scale Score  90%   Psychosocial & Spiritual Assessment    Palliative Care Outcomes       Additional Data Reviewed:  CBC:    Component Value Date/Time   WBC 6.6 04/21/2015 0450   WBC 6.9 04/14/2015 1241   HGB 10.6* 04/21/2015 0450   HGB 10.9* 04/14/2015 1241   HCT 35.5* 04/21/2015 0450   HCT 35.2 04/14/2015 1241   PLT 224 04/21/2015 0450   PLT 234 04/14/2015 1241   MCV 84.1 04/21/2015 0450   MCV 83.2 04/14/2015 1241   NEUTROABS 5.5 04/15/2015 1145   NEUTROABS 5.4 04/14/2015 1241   LYMPHSABS 0.8 04/15/2015 1145   LYMPHSABS 0.7* 04/14/2015 1241   MONOABS 0.9 04/15/2015 1145   MONOABS 0.9 04/14/2015 1241   EOSABS 0.0 04/15/2015 1145   EOSABS 0.0 04/14/2015 1241   BASOSABS 0.0 04/15/2015 1145   BASOSABS 0.0 04/14/2015 1241   Comprehensive Metabolic Panel:    Component Value Date/Time   NA 143 04/21/2015 0450   NA 136 04/14/2015 1241   K 5.1 04/21/2015 0450   K 4.1 04/14/2015 1241   CL 108 04/21/2015 0450   CL 106 10/23/2012 1459   CO2 24 04/21/2015 0450   CO2 26 04/14/2015 1241   BUN 16 04/21/2015 0450   BUN 14.7 04/14/2015 1241   CREATININE 0.49 04/21/2015 0450   CREATININE 0.9 04/14/2015 1241   CREATININE 0.82 08/18/2011 1056   GLUCOSE 102* 04/21/2015 0450   GLUCOSE 127 04/14/2015 1241   GLUCOSE 89 10/23/2012 1459   CALCIUM 8.0* 04/21/2015 0450   CALCIUM 9.5 04/14/2015 1241   AST 275* 04/21/2015 0450   AST 227* 04/14/2015 1241   ALT 81* 04/21/2015 0450   ALT 84* 04/14/2015 1241   ALKPHOS 2128* 04/21/2015 0450   ALKPHOS 2,058* 04/14/2015 1241   BILITOT 4.1* 04/21/2015 0450   BILITOT 2.05* 04/14/2015 1241   PROT 5.6* 04/21/2015 0450   PROT 7.0 04/14/2015 1241   ALBUMIN 2.0* 04/21/2015 0450   ALBUMIN 2.6* 04/14/2015 1241  Discussed with Dr Erlinda Hong and Dr Sonny Dandy  Time In: 1100 Time Out: 1230 Time Total: 90 min Greater than 50%  of this time was spent counseling  and coordinating care related to the above assessment and plan.  Signed by: Wadie Lessen, NP  Knox Royalty, NP  04/21/2015, 10:14 AM  Please contact Palliative Medicine Team phone at [phone] for questions and concerns.

## 2015-04-22 DIAGNOSIS — E43 Unspecified severe protein-calorie malnutrition: Secondary | ICD-10-CM

## 2015-04-22 DIAGNOSIS — R748 Abnormal levels of other serum enzymes: Secondary | ICD-10-CM

## 2015-04-22 MED ORDER — MORPHINE SULFATE 15 MG PO TABS: 15.0000 mg | ORAL_TABLET | Freq: Four times a day (QID) | ORAL | Status: AC | PRN

## 2015-04-22 MED ORDER — LOPERAMIDE HCL 2 MG PO CAPS: 4.0000 mg | ORAL_CAPSULE | ORAL | Status: AC | PRN

## 2015-04-22 MED ORDER — LORAZEPAM 0.5 MG PO TABS: ORAL_TABLET | ORAL | Status: AC

## 2015-04-22 NOTE — Progress Notes (Signed)
This encounter was created in error - please disregard.

## 2015-04-22 NOTE — Discharge Summary (Addendum)
Discharge Summary  [name] [id number] DOB: 06-13-1955  PCP: Rulon Eisenmenger, MD  Admit date: 04/19/2015 Discharge date: 04/22/2015  Time spent: <62mins  Recommendations for Outpatient Follow-up:  1. F/u with home hospice 2. Code status: DNR  Discharge Diagnoses:  Active Hospital Problems   Diagnosis Date Noted  . UTI (lower urinary tract infection) 04/20/2015  . Palliative care encounter 04/21/2015  . DNR (do not resuscitate) 04/21/2015  . Pain, cancer   . Chest pain 04/20/2015  . SOB (shortness of breath) 04/20/2015  . Sepsis ([place]) 04/20/2015  . Protein-calorie malnutrition, severe ([place]) 04/20/2015  . Brain metastases ([place]) 04/14/2015  . Elevated liver enzymes 08/16/2014  . Constipation 08/16/2014  . 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    Discharge Condition: stable  Diet recommendation: regular diet  Filed Weights   04/20/15 0344  Weight: 145 lb 15.1 oz (66.2 kg)    History of present illness:  [name] is a 59 y.o. female with PMH of diffusely metastasized breast cancer, diverticulitis, who presents with dysuria, increased urinary frequency and shortness of breath.  Patient reports that she has been having dysuria, increased urinary frequency in the past several days. She does not have burning on urination. No fever or chills. She also reports having dry cough and shortness of breath. She has mild chest pain intermittently. No tenderness over calf areas. She also has generalized weakness, mild abdominal pain, constipation and mild bilateral ankle edema. No diarrhea or unilateral weakness.  In ED, patient was found to have lactate 2.2, positive urinalysis with small amount of leukocyte, negative troponin, WBC 4.9, temperature 99.8, tachycardia, tachypnea, electrolytes and renal function okay. Chest x-ray showed linear  bibasilar atelectasis, no confluent infiltration. Abnormal liver function with ALP 1732, albumin 2.3, AST 278, ALT 80, total bilirubin 3.2 with indirect bilirubin 1.1 and direct bilirubin 2.1. Patient is admitted to inpatient for further eval and treatment.  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 Patient elected to proceed with home hospice.   Cancer pain and anxiety: prn ativan/morphin, she is also discharged with decadron to prevent headache due to brain meds,   Elevation of lft secondary to liver mets.  Chest pain /sob: negative CTA on 11/29, symptom likely related to lung mets.   FTT/severe malnutrition: home with home hospice.  She was treated with levaquin initially for possible uti, urine culture no significant growth, blood culture no growth.  Procedures:  none  Consultations:  Oncology  Palliative care  hospice  Discharge Exam: BP 135/84 mmHg  Pulse 81  Temp(Src) 98.4 F (36.9 C) (Oral)  Resp 18  Ht 5\' 5"  (1.651 m)  Wt 145 lb 15.1 oz (66.2 kg)  BMI 24.29 kg/m2  SpO2 91%  General: frail, NAd, denies pain, denies sob, on room air Cardiovascular: RRR Respiratory: no rales, no wheezing, no  rhonchi Skin: visible multiple skin meds, no sign of infection Lower extremity: no edema  Discharge Instructions You were cared for by a hospitalist during your hospital stay. If you have any questions about your discharge medications or the care you  received while you were in the hospital after you are discharged, you can call the unit and asked to speak with the hospitalist on call if the hospitalist that took care of you is not available. Once you are discharged, your primary care physician will handle any further medical issues. Please note that NO REFILLS for any discharge medications will be authorized once you are discharged, as it is imperative that you return to your primary care physician (or establish a relationship with a primary care physician if you do not have one) for your aftercare needs so that they can reassess your need for medications and monitor your lab values.  Discharge Instructions    Diet - low sodium heart healthy    Complete by:  As directed      Increase activity slowly    Complete by:  As directed             Medication List    STOP taking these medications        CALCIUM 1200 PO     MULTIVITAMIN PO     naproxen sodium 220 MG tablet  Commonly known as:  ANAPROX     OVER THE COUNTER MEDICATION     oxyCODONE-acetaminophen 5-325 MG tablet  Commonly known as:  PERCOCET/ROXICET     UNABLE TO FIND      TAKE these medications        dexamethasone 4 MG tablet  Commonly known as:  DECADRON  Take 1 tablet (4 mg total) by mouth 2 (two) times daily.     ibuprofen 200 MG tablet  Commonly known as:  ADVIL,MOTRIN  Take 400 mg by mouth every 4 (four) hours as needed for moderate pain.     lidocaine-prilocaine cream  Commonly known as:  EMLA  Apply to affected area once     loperamide 2 MG capsule  Commonly known as:  IMODIUM  Take 2 capsules (4 mg total) by mouth as needed for diarrhea or loose stools.     LORazepam 0.5 MG tablet  Commonly known as:  ATIVAN  TAKE 1 TABLET EVERY 6 HOURS AS NEEDED ANXEITY     morphine 15 MG tablet  Commonly known as:  MSIR  Take 1 tablet (15 mg total) by mouth every 6 (six) hours as needed for severe pain.     prochlorperazine 10 MG tablet  Commonly known as:   COMPAZINE  Take 1 tablet (10 mg total) by mouth every 6 (six) hours as needed (Nausea or vomiting).      ASK your doctor about these medications        ondansetron 8 MG tablet  Commonly known as:  ZOFRAN  Take 1 tablet (8 mg total) by mouth 2 (two) times daily. The day after chemo for 2 days, then every 8 hours PRN for N/V       Allergies  Allergen Reactions  . Penicillins Rash    Has patient had a PCN reaction causing immediate rash, facial/tongue/throat swelling, SOB or lightheadedness with hypotension: no Has patient had a PCN reaction causing severe rash involving mucus membranes or skin necrosis: no Has patient had a PCN reaction that required hospitalization no Has patient had a PCN reaction occurring within the last 10  years: yes If all of the above answers are "NO", then may proceed with Cephalosporin use.       The results of significant diagnostics from this hospitalization (including imaging, microbiology, ancillary and laboratory) are listed below for reference.    Significant Diagnostic Studies: Dg Chest 2 View  04/20/2015  CLINICAL DATA:  Dyspnea and weakness. EXAM: CHEST  2 VIEW COMPARISON:  04/15/2015 FINDINGS: There is a left-sided Port-A-Cath with tip in the low SVC. There is a shallow inspiration. There are mild basilar opacities which are accentuated due to the shallow inspiration but there probably is a degree of basilar atelectasis or streaky basilar infiltrate which has worsened. No confluent airspace consolidation. No large effusions. Hilar and mediastinal contours are unremarkable and unchanged. Diffuse sclerotic skeletal metastases are again evident. IMPRESSION: 1. Linear bibasilar atelectasis or infiltrate, worsened although somewhat accentuated by the shallow inspiration. 2. No confluent airspace consolidation.  No effusion. 3. Diffuse sclerotic skeletal metastases. Electronically Signed   By: Andreas Newport M.D.   On: 04/20/2015 01:29   Dg Chest 2  View  04/15/2015  CLINICAL DATA:  59 year old with widespread metastatic breast cancer, presenting with fever, acute chest pain and bilateral shoulder pain. EXAM: CHEST  2 VIEW COMPARISON:  PET-CT 04/03/2015 and earlier. CT chest 02/13/2015 and earlier. Most recent prior chest x-ray 11/06/2012. FINDINGS: AP semi-erect and lateral images were obtained. Posterior costophrenic angles obscured on the lateral view due to the metal in the stretcher. Suboptimal inspiration. Linear scar and atelectasis in the lower lobes and right middle lobe, unchanged from the PET-CT 12 days ago. No new pulmonary parenchymal abnormalities. Cardiac silhouette mildly enlarged, unchanged. Hilar and mediastinal contours unremarkable. Left jugular Port-A-Cath tip at or near the cavoatrial junction. Sclerotic osseous metastatic disease involving the thoracic and upper lumbar spine, sternum, and multiple ribs as noted on the PET-CT. Prior right mastectomy and axillary node dissection. IMPRESSION: 1. Suboptimal inspiration. Linear scar and atelectasis in the lower lobes and right middle lobe, unchanged since the PET-CT 12 days ago. 2.  No acute cardiopulmonary disease. 3. Widespread osseous metastatic disease as noted on PET-CT. Electronically Signed   By: Evangeline Dakin M.D.   On: 04/15/2015 12:58   Ct Head Wo Contrast  04/12/2015  CLINICAL DATA:  Nausea and vomiting with blurry vision. History of breast cancer. EXAM: CT HEAD WITHOUT CONTRAST TECHNIQUE: Contiguous axial images were obtained from the base of the skull through the vertex without intravenous contrast. COMPARISON:  None FINDINGS: There is a round hypodense focus within the LEFT aspect of the pons measuring 10 mm on image 9, series 2. Second 11 mm hypodense focus within the deep RIGHT temporal lobe adjacent to the quadrigeminal plate cistern (image 12, series 2). No intracranial hemorrhage. Ventricles are normal volume. No hydrocephalus. No midline shift. There is cisterns  are patent. There multiple subcutaneous nodules within the scalp. No evidence of calvarial metastasis. IMPRESSION: 1. Two hyperdense foci: one within the deep RIGHT temporal lobe and a second within the brainstem are concerning for intracranial metastasis. 2. No midline shift or mass effect. 3. Recommend brain MRI with contrast for further evaluation. Electronically Signed   By: Suzy Bouchard M.D.   On: 04/12/2015 14:14   Ct Angio Chest Pe W/cm &/or Wo Cm  04/15/2015  CLINICAL DATA:  59 year old female with history of chest pain for 1 day. History of right-sided breast cancer status post right-sided mastectomy. Chemotherapy in progress. EXAM: CT ANGIOGRAPHY CHEST WITH CONTRAST TECHNIQUE: Multidetector CT imaging of  the chest was performed using the standard protocol during bolus administration of intravenous contrast. Multiplanar CT image reconstructions and MIPs were obtained to evaluate the vascular anatomy. CONTRAST:  81mL OMNIPAQUE IOHEXOL 350 MG/ML SOLN COMPARISON:  Chest CT 02/13/2015.  PET-CT 04/03/2015. FINDINGS: Mediastinum/Lymph Nodes: Study is slightly limited by respiratory motion. With these limitations in mind, there is no evidence to suggest clinically relevant central, lobar or segmental sized pulmonary embolism. Smaller distal subsegmental sized emboli cannot be entirely excluded. Heart size is borderline enlarged. There is no significant pericardial fluid, thickening or pericardial calcification. No pathologically enlarged mediastinal, internal mammary or hilar lymph nodes. Esophagus is unremarkable in appearance. Several prominent but nonenlarged right subpectoral lymph nodes are noted measuring up to 6 mm in short axis. Left-sided internal jugular single-lumen porta cath with tip terminating at the superior cavoatrial junction Lungs/Pleura: There is some subpleural reticulation and extensive subpleural nodularity in the anterior aspect of the right lung, most evident in the anterior right  middle lobe (image 55 of series 8). While some of this could be related to prior radiation therapy, the appearance is suspicious for developing lymphangitic spread of disease in the right lung. New subsegmental atelectasis in the a left lung base. Trace left pleural effusion. Small calcified granuloma in the left lower lobe. Upper Abdomen: Heterogeneous appearance of the liver with multiple ill-defined hypovascular lesions scattered throughout the hepatic parenchyma, compatible with widespread metastatic disease, largest of which is estimated to measure approximately 2.2 cm in diameter in the superior aspect of segment 7 (image 57 of series 5). Musculoskeletal/Soft Tissues: Status post right modified radical mastectomy and axillary lymph node dissection. Extensive nodularity is noted throughout the subcutaneous fat of the chest wall, most severe on the right side anteriorly, concerning for multiple small soft tissue metastases, the largest of these is anterior to the sternum measuring 9 mm (image 30 of series 5). Widespread predominantly sclerotic lesions throughout the visualized axial and appendicular skeleton, compatible with widespread metastatic disease to the bones. Review of the MIP images confirms the above findings. IMPRESSION: 1. Despite the mild limitations of today's examination there is no evidence to suggest clinically relevant central, lobar or segmental sized pulmonary embolism. 2. Progressively worsening metastatic disease throughout the subcutaneous fat of the right chest wall, bones and liver, as discussed above. In addition, there is extensive subpleural nodularity in the anterior aspect of the right lung, most evident in the right middle lobe, concerning for developing lymphangitic spread of tumor in the lung. 3. Left lower lobe subsegmental atelectasis in the basal segments. 4. Additional incidental findings, as above. Electronically Signed   By: Vinnie Langton M.D.   On: 04/15/2015 13:47    Nm Pet Image Restag (ps) Skull Base To Thigh  04/03/2015  CLINICAL DATA:  Subsequent treatment strategy for central right breast cancer, metastatic, worsening symptoms. Less radiation therapy 4 weeks ago. EXAM: NUCLEAR MEDICINE PET SKULL BASE TO THIGH TECHNIQUE: 12.1 mCi F-18 FDG was injected intravenously. Full-ring PET imaging was performed from the skull base to thigh after the radiotracer. CT data was obtained and used for attenuation correction and anatomic localization. FASTING BLOOD GLUCOSE:  Value: 78 mg/dl COMPARISON:  Multiple exams, including 12/06/2014 FINDINGS: NECK Recurrent adenopathy in the neck along with subcutaneous and intramuscular hypermetabolic nodules compatible with widespread metastatic disease. An index station [place] node measuring 1.2 cm in short axis has maximum standard uptake value of 11.5 and was not present previously. CHEST Multiple hypermetabolic subcutaneous nodules present in the chest along  with some intramuscular foci of hypermetabolic activity. At the level of the left a lung apex, a nodule deep to the trapezius muscle measuring 1.0 by 1.2 cm on image 49 series 4 has maximum standard uptake value of 14.5. A small subcutaneous nodule anterior to the sternum measuring 0.7 by 0.8 cm on image 84 series 4 has maximum standard uptake value of 8.8. These nodules are new. Scattered atelectasis or scarring in the left lower lobe, increased from prior. Mild cardiomegaly. Stable 5 mm subpleural lymph node along the left major fissure inferiorly. ABDOMEN/PELVIS Extensive hypermetabolic activity throughout the liver compatible with diffuse hepatic metastatic disease. A representative region in the right hepatic lobe has a maximum standard uptake value of 16.3, formerly 3.4. The diffuse hepatic metastatic disease is less notable on the CT images but is striking on the PET images. All segments are involved. The hypermetabolic activity is confluent geographically. Tiny hypermetabolic  nodule along the iliacus muscle on the right is new, maximum standard uptake value 3.8. SKELETON Scattered osseous metastatic disease through the visualized appendicular and axial skeleton. Index involvement of the right sacral ala maximum standard uptake value 8.7, same region previously had a maximum standard uptake value of 3.1. IMPRESSION: 1. There is been marked progression of hypermetabolic metastatic disease with heavy geographic metastatic disease throughout the liver ; scattered subcutaneous and intramuscular hypermetabolic nodules in the neck and chest ; and multifocal diffuse hypermetabolic metastatic disease to the axial and appendicular skeleton, considerably increased from prior. Electronically Signed   By: Van Clines M.D.   On: 04/03/2015 14:59    Microbiology: Recent Results (from the past 240 hour(s))  Urine culture     Status: None   Collection Time: 04/20/15 12:03 AM  Result Value Ref Range Status   Specimen Description URINE, CATHETERIZED  Final   Special Requests NONE  Final   Culture   Final    1,000 COLONIES/mL INSIGNIFICANT GROWTH Performed at [facility]    Report Status 04/21/2015 FINAL  Final  Culture, blood (routine x 2)     Status: None (Preliminary result)   Collection Time: 04/20/15  3:02 AM  Result Value Ref Range Status   Specimen Description BLOOD LEFT ANTECUBITAL  Final   Special Requests BOTTLES DRAWN AEROBIC AND ANAEROBIC 5ML  Final   Culture   Final    NO GROWTH 1 DAY Performed at [facility]    Report Status PENDING  Incomplete  Culture, blood (routine x 2)     Status: None (Preliminary result)   Collection Time: 04/20/15  3:02 AM  Result Value Ref Range Status   Specimen Description BLOOD LEFT HAND  Final   Special Requests BOTTLES DRAWN AEROBIC AND ANAEROBIC 5CC  Final   Culture   Final    NO GROWTH 1 DAY Performed at [facility]    Report Status PENDING  Incomplete     Labs: Basic Metabolic  Panel:  Recent Labs Lab 04/15/15 1145 04/19/15 2159 04/20/15 0303 04/21/15 0450  NA 139 139 143 143  K 4.5 4.4 4.7 5.1  CL 100* 100* 102 108  CO2 30 28 29 24   GLUCOSE 121* 168* 106* 102*  BUN 19 20 18 16   CREATININE 0.71 0.71 0.62 0.49  CALCIUM 9.1 8.7* 8.6* 8.0*  MG  --   --   --  2.1   Liver Function Tests:  Recent Labs Lab 04/15/15 1145 04/19/15 2326 04/20/15 0303 04/21/15 0450  AST 235* 278* 271* 275*  ALT 81* 80* 81* 81*  ALKPHOS 512* 1732* 1764* 2128*  BILITOT 2.0* 3.2* 3.3* 4.1*  PROT 6.7 6.3* 6.1* 5.6*  ALBUMIN 2.9* 2.3* 2.1* 2.0*    Recent Labs Lab 04/19/15 2326  LIPASE 18   No results for input(s): AMMONIA in the last 168 hours. CBC:  Recent Labs Lab 04/15/15 1145 04/19/15 2159 04/20/15 0303 04/21/15 0450  WBC 7.3 4.9 5.5 6.6  NEUTROABS 5.5  --   --   --   HGB 10.8* 11.0* 10.6* 10.6*  HCT 35.3* 35.1* 34.2* 35.5*  MCV 83.3 83.4 81.8 84.1  PLT 246 206 205 224   Cardiac Enzymes:  Recent Labs Lab 04/15/15 1154  TROPONINI 0.06*   BNP: BNP (last 3 results) No results for input(s): BNP in the last 8760 hours.  ProBNP (last 3 results) No results for input(s): PROBNP in the last 8760 hours.  CBG:  Recent Labs Lab 04/19/15 2213  GLUCAP 140*       Signed:  Vianne Grieshop MD, PhD  [facility] 04/22/2015, 11:03 AM

## 2015-04-22 NOTE — Progress Notes (Signed)
Report received from J. Scotton, RN. No change from initial pm assessment. Will continue to monitor and follow the POC. 

## 2015-04-22 NOTE — Progress Notes (Signed)
[facility] called for transport request at 1448 this shift. Home address given to [facility]. Patient ready for transport at this time- PIV removed and wearing paper scrubs. Daughter notified and stated she would "be home all day." VS stable. Patient A&O, no S/S of distress. Awaiting transport at this time.

## 2015-04-25 ENCOUNTER — Ambulatory Visit: Payer: 59

## 2015-04-25 ENCOUNTER — Other Ambulatory Visit: Payer: 59

## 2015-04-25 LAB — CULTURE, BLOOD (ROUTINE X 2)
CULTURE: NO GROWTH
Culture: NO GROWTH

## 2015-04-28 ENCOUNTER — Ambulatory Visit: Payer: 59 | Admitting: Radiation Oncology

## 2015-04-28 ENCOUNTER — Ambulatory Visit: Payer: 59

## 2015-04-28 ENCOUNTER — Inpatient Hospital Stay: Admit: 2015-04-28 | Payer: 59

## 2015-04-28 ENCOUNTER — Telehealth: Payer: Self-pay | Admitting: *Deleted

## 2015-04-28 NOTE — Telephone Encounter (Signed)
Call from daughter Harmon Pier asking to speak with Dr. Lindi Adie.  "She's under Hospice, I asked for cannabis oil and was told no this is not covered by Hospice.  I need a prescription for this.  Morphine is killing her organs.  Cannabis oil helps your appetite, increases your energy and kills cancer."  Call transferred tro collaborative nurse.

## 2015-05-01 ENCOUNTER — Telehealth: Payer: Self-pay | Admitting: *Deleted

## 2015-05-02 ENCOUNTER — Ambulatory Visit: Admission: RE | Admit: 2015-05-02 | Payer: 59 | Source: Ambulatory Visit | Admitting: Radiation Oncology

## 2015-05-02 ENCOUNTER — Ambulatory Visit: Payer: 59 | Admitting: Radiation Oncology

## 2015-05-06 ENCOUNTER — Encounter: Payer: Self-pay | Admitting: Hematology and Oncology

## 2015-05-06 NOTE — Progress Notes (Signed)
FMLA forms for [name] faxed to [phone], placed in the lobby and forwarded to medical records.

## 2015-05-08 ENCOUNTER — Encounter: Payer: Self-pay | Admitting: *Deleted

## 2015-05-08 NOTE — Progress Notes (Signed)
Received notes from [facility], reviewed by Dr. Gudena, sent to scan. 

## 2015-05-15 ENCOUNTER — Encounter: Payer: Self-pay | Admitting: *Deleted

## 2015-05-15 NOTE — Progress Notes (Signed)
Received notes from [place], sent to scan

## 2015-05-18 NOTE — Telephone Encounter (Signed)
Call from [facility] with Hospice reporting patient has died.  Will notify Dr. Lindi Adie.

## 2015-05-18 DEATH — deceased

## 2015-11-03 ENCOUNTER — Other Ambulatory Visit: Payer: Self-pay | Admitting: Nurse Practitioner

## 2016-04-04 IMAGING — CT NM PET TUM IMG RESTAG (PS) SKULL BASE T - THIGH
1 of 6 series · 1 of 25 positions shown · non-contrast
Comparison: CT scans 02/11/2014 and 09/18/2013

CLINICAL DATA: Subsequent treatment strategy for metastatic breast
cancer.

EXAM:
NUCLEAR MEDICINE PET SKULL BASE TO THIGH
TECHNIQUE: 7.9 mCi F-18 FDG was injected intravenously. Full-ring PET imaging
was performed from the skull base to thigh after the radiotracer. CT
data was obtained and used for attenuation correction and anatomic
localization.
FASTING BLOOD GLUCOSE:  Value: 101 mg/dl

[Series 4: ct sk_thigh 5.0 b31f · axial · 5.0mm · 0.98mm/px · 1 of 224 slices shown]
[im 224/224  brain]
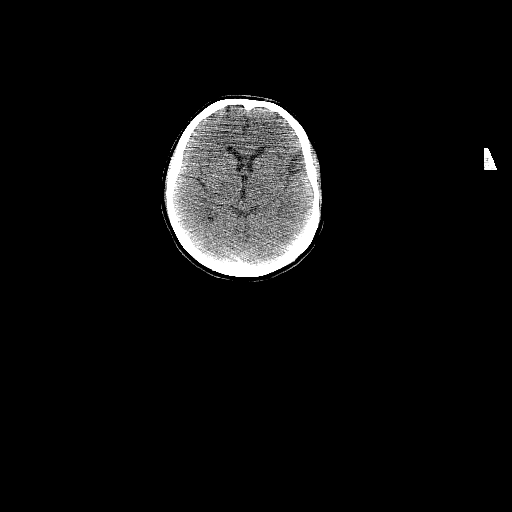

[1 of 25 positions shown; findings below may reference images not displayed]

FINDINGS: NECK

No hypermetabolic lymph nodes in the neck.

CHEST

No hypermetabolic mediastinal or hilar nodes. No suspicious
pulmonary nodules on the CT scan. There are radiation changes
involving the anterior aspect of the right lung. Is also bibasilar
scarring and atelectasis. No obvious pulmonary nodules.

ABDOMEN/PELVIS

No abnormal hypermetabolic activity within the liver, pancreas,
adrenal glands, or spleen. No hypermetabolic lymph nodes in the
abdomen or pelvis.

SKELETON

There is diffuse widespread osseous metastatic disease involving the
axial and appendicular skeleton. The spine, ribs and pelvis are
diffusely in wall. There are also lesions in the femurs bilaterally,
the left scapula and both humeri. Skull lesions are also noted.
IMPRESSION: Diffuse/widespread osseous metastatic disease.

No chest wall tumor, axillary adenopathy, lung nodules or abdominal
metastasis.
